# Patient Record
Sex: Female | Born: 1945 | Race: White | Hispanic: No | Marital: Married | Smoking: Former smoker
Health system: Southern US, Community
[De-identification: ages and names within clinical notes are randomized; demographics above are authoritative.]

## PROBLEM LIST (undated history)

## (undated) DIAGNOSIS — I351 Nonrheumatic aortic (valve) insufficiency: Secondary | ICD-10-CM

## (undated) DIAGNOSIS — I071 Rheumatic tricuspid insufficiency: Secondary | ICD-10-CM

## (undated) DIAGNOSIS — J189 Pneumonia, unspecified organism: Secondary | ICD-10-CM

## (undated) DIAGNOSIS — I739 Peripheral vascular disease, unspecified: Secondary | ICD-10-CM

## (undated) DIAGNOSIS — I2722 Pulmonary hypertension due to left heart disease: Secondary | ICD-10-CM

## (undated) DIAGNOSIS — I059 Rheumatic mitral valve disease, unspecified: Secondary | ICD-10-CM

## (undated) DIAGNOSIS — I779 Disorder of arteries and arterioles, unspecified: Secondary | ICD-10-CM

## (undated) DIAGNOSIS — R0989 Other specified symptoms and signs involving the circulatory and respiratory systems: Secondary | ICD-10-CM

## (undated) DIAGNOSIS — I4819 Other persistent atrial fibrillation: Secondary | ICD-10-CM

## (undated) DIAGNOSIS — T8543XA Leakage of breast prosthesis and implant, initial encounter: Secondary | ICD-10-CM

## (undated) DIAGNOSIS — R42 Dizziness and giddiness: Secondary | ICD-10-CM

## (undated) DIAGNOSIS — C50919 Malignant neoplasm of unspecified site of unspecified female breast: Secondary | ICD-10-CM

## (undated) DIAGNOSIS — I672 Cerebral atherosclerosis: Secondary | ICD-10-CM

## (undated) DIAGNOSIS — I00 Rheumatic fever without heart involvement: Secondary | ICD-10-CM

## (undated) DIAGNOSIS — R0600 Dyspnea, unspecified: Secondary | ICD-10-CM

## (undated) DIAGNOSIS — I517 Cardiomegaly: Secondary | ICD-10-CM

## (undated) DIAGNOSIS — I5032 Chronic diastolic (congestive) heart failure: Secondary | ICD-10-CM

## (undated) DIAGNOSIS — I050 Rheumatic mitral stenosis: Secondary | ICD-10-CM

## (undated) HISTORY — PX: TOTAL ABDOMINAL HYSTERECTOMY: SHX209

## (undated) HISTORY — DX: Nonrheumatic aortic (valve) insufficiency: I35.1

## (undated) HISTORY — DX: Cardiomegaly: I51.7

## (undated) HISTORY — DX: Rheumatic tricuspid insufficiency: I07.1

## (undated) HISTORY — PX: MASTECTOMY: SHX3

## (undated) HISTORY — PX: PLACEMENT OF BREAST IMPLANTS: SHX6334

## (undated) HISTORY — DX: Rheumatic mitral valve disease, unspecified: I05.9

## (undated) HISTORY — DX: Cerebral atherosclerosis: I67.2

## (undated) HISTORY — DX: Pulmonary hypertension due to left heart disease: I27.22

## (undated) HISTORY — DX: Rheumatic fever without heart involvement: I00

## (undated) HISTORY — PX: APPENDECTOMY: SHX54

## (undated) HISTORY — DX: Dyspnea, unspecified: R06.00

## (undated) HISTORY — DX: Rheumatic mitral stenosis: I05.0

## (undated) HISTORY — PX: CATARACT EXTRACTION: SUR2

## (undated) HISTORY — DX: Malignant neoplasm of unspecified site of unspecified female breast: C50.919

## (undated) HISTORY — DX: Pneumonia, unspecified organism: J18.9

## (undated) HISTORY — DX: Other specified symptoms and signs involving the circulatory and respiratory systems: R09.89

## (undated) HISTORY — DX: Other persistent atrial fibrillation: I48.19

## (undated) HISTORY — DX: Dizziness and giddiness: R42

## (undated) HISTORY — DX: Leakage of breast prosthesis and implant, initial encounter: T85.43XA

## (undated) HISTORY — DX: Chronic diastolic (congestive) heart failure: I50.32

## (undated) HISTORY — PX: CHOLECYSTECTOMY: SHX55

---

## 2005-02-02 ENCOUNTER — Ambulatory Visit: Payer: Self-pay | Admitting: Cardiology

## 2005-09-25 ENCOUNTER — Ambulatory Visit: Payer: Self-pay | Admitting: Cardiology

## 2006-08-08 HISTORY — PX: CARDIAC CATHETERIZATION: SHX172

## 2006-10-09 ENCOUNTER — Ambulatory Visit: Payer: Self-pay | Admitting: Cardiology

## 2006-10-20 ENCOUNTER — Ambulatory Visit: Payer: Self-pay | Admitting: Cardiology

## 2006-11-01 ENCOUNTER — Ambulatory Visit: Payer: Self-pay | Admitting: Cardiology

## 2006-11-02 ENCOUNTER — Ambulatory Visit: Payer: Self-pay | Admitting: Cardiology

## 2006-11-06 ENCOUNTER — Ambulatory Visit: Payer: Self-pay | Admitting: Cardiovascular Disease

## 2006-11-06 ENCOUNTER — Ambulatory Visit: Payer: Self-pay | Admitting: Internal Medicine

## 2006-11-09 ENCOUNTER — Ambulatory Visit: Payer: Self-pay | Admitting: Cardiology

## 2006-11-15 ENCOUNTER — Ambulatory Visit: Payer: Self-pay | Admitting: Cardiology

## 2006-11-22 ENCOUNTER — Ambulatory Visit: Payer: Self-pay | Admitting: Cardiology

## 2006-12-01 ENCOUNTER — Ambulatory Visit: Payer: Self-pay | Admitting: Cardiology

## 2006-12-12 ENCOUNTER — Ambulatory Visit: Payer: Self-pay | Admitting: Cardiology

## 2006-12-19 ENCOUNTER — Ambulatory Visit: Payer: Self-pay | Admitting: Cardiology

## 2006-12-28 ENCOUNTER — Ambulatory Visit: Payer: Self-pay | Admitting: Cardiology

## 2007-01-02 ENCOUNTER — Ambulatory Visit: Payer: Self-pay | Admitting: Cardiology

## 2007-01-11 ENCOUNTER — Ambulatory Visit: Payer: Self-pay | Admitting: Cardiology

## 2007-01-25 ENCOUNTER — Ambulatory Visit: Payer: Self-pay | Admitting: Physician Assistant

## 2007-02-22 ENCOUNTER — Ambulatory Visit: Payer: Self-pay | Admitting: Cardiology

## 2007-04-17 ENCOUNTER — Ambulatory Visit: Payer: Self-pay | Admitting: Cardiology

## 2007-07-02 ENCOUNTER — Ambulatory Visit: Payer: Self-pay | Admitting: Cardiology

## 2007-07-16 ENCOUNTER — Ambulatory Visit: Payer: Self-pay | Admitting: Cardiology

## 2007-07-16 ENCOUNTER — Inpatient Hospital Stay (HOSPITAL_BASED_OUTPATIENT_CLINIC_OR_DEPARTMENT_OTHER): Admission: RE | Admit: 2007-07-16 | Discharge: 2007-07-16 | Payer: Self-pay | Admitting: Cardiology

## 2007-08-06 ENCOUNTER — Ambulatory Visit: Payer: Self-pay | Admitting: Cardiology

## 2007-12-13 ENCOUNTER — Ambulatory Visit: Payer: Self-pay | Admitting: Cardiology

## 2008-03-03 ENCOUNTER — Ambulatory Visit: Payer: Self-pay | Admitting: Cardiology

## 2008-03-11 ENCOUNTER — Ambulatory Visit: Payer: Self-pay | Admitting: Cardiology

## 2008-03-21 ENCOUNTER — Ambulatory Visit: Payer: Self-pay | Admitting: Cardiology

## 2008-03-27 ENCOUNTER — Ambulatory Visit: Payer: Self-pay | Admitting: Cardiology

## 2008-04-04 ENCOUNTER — Ambulatory Visit: Payer: Self-pay | Admitting: Cardiology

## 2008-04-11 ENCOUNTER — Ambulatory Visit: Payer: Self-pay | Admitting: Cardiology

## 2008-04-26 ENCOUNTER — Ambulatory Visit: Payer: Self-pay | Admitting: Cardiology

## 2008-05-26 ENCOUNTER — Ambulatory Visit: Payer: Self-pay | Admitting: Cardiology

## 2008-06-10 ENCOUNTER — Ambulatory Visit: Payer: Self-pay | Admitting: Cardiology

## 2008-07-02 ENCOUNTER — Encounter: Payer: Self-pay | Admitting: Cardiology

## 2008-07-02 ENCOUNTER — Ambulatory Visit: Payer: Self-pay | Admitting: Cardiology

## 2008-07-02 DIAGNOSIS — I4891 Unspecified atrial fibrillation: Secondary | ICD-10-CM

## 2008-07-02 DIAGNOSIS — L659 Nonscarring hair loss, unspecified: Secondary | ICD-10-CM | POA: Insufficient documentation

## 2008-07-02 DIAGNOSIS — Z9010 Acquired absence of unspecified breast and nipple: Secondary | ICD-10-CM

## 2008-07-08 ENCOUNTER — Ambulatory Visit: Payer: Self-pay | Admitting: Cardiology

## 2008-07-15 ENCOUNTER — Ambulatory Visit: Payer: Self-pay | Admitting: Cardiology

## 2008-07-23 ENCOUNTER — Ambulatory Visit: Payer: Self-pay | Admitting: Internal Medicine

## 2008-08-04 ENCOUNTER — Ambulatory Visit (HOSPITAL_COMMUNITY): Admission: RE | Admit: 2008-08-04 | Discharge: 2008-08-04 | Payer: Self-pay | Admitting: Internal Medicine

## 2008-08-11 ENCOUNTER — Encounter: Payer: Self-pay | Admitting: Cardiology

## 2008-08-11 ENCOUNTER — Ambulatory Visit: Payer: Self-pay | Admitting: Cardiology

## 2008-08-11 DIAGNOSIS — I2789 Other specified pulmonary heart diseases: Secondary | ICD-10-CM

## 2008-08-11 DIAGNOSIS — Z8679 Personal history of other diseases of the circulatory system: Secondary | ICD-10-CM

## 2008-08-19 ENCOUNTER — Encounter: Admission: RE | Admit: 2008-08-19 | Discharge: 2008-08-19 | Payer: Self-pay | Admitting: Internal Medicine

## 2008-09-05 ENCOUNTER — Ambulatory Visit: Payer: Self-pay | Admitting: Cardiology

## 2008-09-26 ENCOUNTER — Ambulatory Visit: Payer: Self-pay | Admitting: Cardiology

## 2008-10-03 ENCOUNTER — Ambulatory Visit: Payer: Self-pay | Admitting: Cardiology

## 2008-10-10 ENCOUNTER — Encounter: Payer: Self-pay | Admitting: Cardiology

## 2008-10-17 ENCOUNTER — Ambulatory Visit: Payer: Self-pay

## 2008-10-23 ENCOUNTER — Encounter: Payer: Self-pay | Admitting: Cardiology

## 2008-10-29 ENCOUNTER — Ambulatory Visit: Payer: Self-pay | Admitting: Cardiology

## 2008-11-04 ENCOUNTER — Ambulatory Visit: Payer: Self-pay | Admitting: Cardiology

## 2008-11-06 ENCOUNTER — Encounter: Payer: Self-pay | Admitting: Cardiology

## 2008-11-06 ENCOUNTER — Ambulatory Visit: Payer: Self-pay | Admitting: Cardiology

## 2008-11-11 ENCOUNTER — Ambulatory Visit: Payer: Self-pay | Admitting: Cardiology

## 2008-11-12 ENCOUNTER — Encounter: Admission: RE | Admit: 2008-11-12 | Discharge: 2008-11-12 | Payer: Self-pay | Admitting: Internal Medicine

## 2008-11-13 ENCOUNTER — Encounter: Payer: Self-pay | Admitting: Cardiology

## 2008-11-18 ENCOUNTER — Ambulatory Visit: Payer: Self-pay | Admitting: Cardiology

## 2008-11-25 ENCOUNTER — Ambulatory Visit: Payer: Self-pay | Admitting: Cardiology

## 2008-12-02 ENCOUNTER — Ambulatory Visit: Payer: Self-pay | Admitting: Cardiology

## 2008-12-19 ENCOUNTER — Ambulatory Visit: Payer: Self-pay | Admitting: Cardiology

## 2008-12-23 ENCOUNTER — Ambulatory Visit: Payer: Self-pay | Admitting: Cardiology

## 2008-12-30 ENCOUNTER — Ambulatory Visit: Payer: Self-pay | Admitting: Cardiology

## 2009-01-06 ENCOUNTER — Ambulatory Visit: Payer: Self-pay

## 2009-01-06 ENCOUNTER — Ambulatory Visit: Payer: Self-pay | Admitting: Cardiology

## 2009-01-07 ENCOUNTER — Encounter: Payer: Self-pay | Admitting: Cardiology

## 2009-01-08 ENCOUNTER — Encounter: Payer: Self-pay | Admitting: Cardiology

## 2009-01-09 ENCOUNTER — Telehealth: Payer: Self-pay | Admitting: Cardiology

## 2009-01-10 ENCOUNTER — Telehealth: Payer: Self-pay | Admitting: Adult Health

## 2009-01-11 ENCOUNTER — Telehealth: Payer: Self-pay | Admitting: Physician Assistant

## 2009-01-12 ENCOUNTER — Telehealth: Payer: Self-pay | Admitting: Cardiology

## 2009-01-13 ENCOUNTER — Ambulatory Visit: Payer: Self-pay | Admitting: Cardiology

## 2009-01-16 ENCOUNTER — Telehealth: Payer: Self-pay | Admitting: Cardiology

## 2009-01-20 ENCOUNTER — Ambulatory Visit: Payer: Self-pay | Admitting: Cardiology

## 2009-01-22 ENCOUNTER — Ambulatory Visit: Payer: Self-pay | Admitting: Cardiology

## 2009-01-27 ENCOUNTER — Ambulatory Visit: Payer: Self-pay | Admitting: Cardiology

## 2009-02-03 ENCOUNTER — Ambulatory Visit: Payer: Self-pay | Admitting: Cardiology

## 2009-02-11 ENCOUNTER — Ambulatory Visit: Payer: Self-pay | Admitting: Cardiology

## 2009-02-11 ENCOUNTER — Encounter: Payer: Self-pay | Admitting: Cardiology

## 2009-03-23 ENCOUNTER — Encounter: Payer: Self-pay | Admitting: *Deleted

## 2009-04-01 ENCOUNTER — Encounter: Payer: Self-pay | Admitting: Cardiology

## 2009-04-28 ENCOUNTER — Encounter: Payer: Self-pay | Admitting: Cardiology

## 2009-05-11 ENCOUNTER — Encounter: Admission: RE | Admit: 2009-05-11 | Discharge: 2009-05-11 | Payer: Self-pay | Admitting: Internal Medicine

## 2009-06-01 ENCOUNTER — Encounter (INDEPENDENT_AMBULATORY_CARE_PROVIDER_SITE_OTHER): Payer: Self-pay | Admitting: *Deleted

## 2009-06-02 ENCOUNTER — Ambulatory Visit: Payer: Self-pay | Admitting: Cardiology

## 2009-06-02 DIAGNOSIS — C50919 Malignant neoplasm of unspecified site of unspecified female breast: Secondary | ICD-10-CM | POA: Insufficient documentation

## 2009-06-03 ENCOUNTER — Encounter: Payer: Self-pay | Admitting: Cardiology

## 2009-07-08 ENCOUNTER — Encounter: Payer: Self-pay | Admitting: Cardiology

## 2009-07-13 ENCOUNTER — Encounter: Payer: Self-pay | Admitting: Cardiology

## 2009-07-21 ENCOUNTER — Encounter (INDEPENDENT_AMBULATORY_CARE_PROVIDER_SITE_OTHER): Payer: Self-pay | Admitting: *Deleted

## 2009-09-02 ENCOUNTER — Encounter: Admission: RE | Admit: 2009-09-02 | Discharge: 2009-09-02 | Payer: Self-pay | Admitting: Internal Medicine

## 2009-11-03 ENCOUNTER — Telehealth (INDEPENDENT_AMBULATORY_CARE_PROVIDER_SITE_OTHER): Payer: Self-pay | Admitting: *Deleted

## 2009-11-04 ENCOUNTER — Telehealth: Payer: Self-pay | Admitting: Adult Health

## 2009-11-25 ENCOUNTER — Ambulatory Visit: Payer: Self-pay | Admitting: Cardiology

## 2009-11-25 DIAGNOSIS — R05 Cough: Secondary | ICD-10-CM

## 2009-11-26 ENCOUNTER — Encounter: Payer: Self-pay | Admitting: Cardiology

## 2010-01-27 ENCOUNTER — Ambulatory Visit: Payer: Self-pay | Admitting: Cardiology

## 2010-04-08 ENCOUNTER — Telehealth (INDEPENDENT_AMBULATORY_CARE_PROVIDER_SITE_OTHER): Payer: Self-pay | Admitting: *Deleted

## 2010-06-03 ENCOUNTER — Ambulatory Visit: Payer: Self-pay | Admitting: Cardiology

## 2010-06-17 ENCOUNTER — Telehealth (INDEPENDENT_AMBULATORY_CARE_PROVIDER_SITE_OTHER): Payer: Self-pay | Admitting: *Deleted

## 2010-06-23 ENCOUNTER — Encounter: Payer: Self-pay | Admitting: Cardiology

## 2010-07-05 ENCOUNTER — Encounter: Payer: Self-pay | Admitting: Cardiology

## 2010-07-07 ENCOUNTER — Telehealth (INDEPENDENT_AMBULATORY_CARE_PROVIDER_SITE_OTHER): Payer: Self-pay | Admitting: *Deleted

## 2010-09-07 NOTE — Letter (Signed)
Summary: Letter/ DISABILITY PARKING FORM  Letter/ DISABILITY PARKING FORM   Imported By: Bartholomew Boards 11/25/2009 15:41:31  _____________________________________________________________________  External Attachment:    Type:   Image     Comment:   External Document

## 2010-09-07 NOTE — Assessment & Plan Note (Signed)
Summary: 6 MO F/U PER REMINDER-JM   Visit Type:  Follow-up Primary Quintrell Baze:  Sharma Covert   History of Present Illness: the patient is a 65 year old female with a history of atrial fibrillation, status post cardioversion now maintaining normal sinus rhythm on propafenone therapy.the patient is not on Coumadin she has very low risk for stroke embolic events. She currently has a bronchitis. She has no history of coronary artery disease.  She does report frequent episodes that are waking her up at the o'clock or 4:00 in the morning with a sensation of an irregular heartbeat. However she does not feel that her heart beat is rapid. She denies any chest pain or shortness of breath associated with this. She also has no presyncope or syncope.  Preventive Screening-Counseling & Management  Alcohol-Tobacco     Smoking Status: quit     Year Quit: 1985  Current Medications (verified): 1)  Diltiazem Hcl Er Beads 120 Mg Xr24h-Cap (Diltiazem Hcl Er Beads) .... Take One Capsule By Mouth Once Daily 2)  Nitroglycerin 0.4 Mg Subl (Nitroglycerin) .... Place 1 Tablet Under Tongue 3)  Propafenone Hcl 150 Mg Tabs (Propafenone Hcl) .... Take 1 Tablet By Mouth Every Eight Hours 4)  Metoprolol Tartrate 25 Mg Tabs (Metoprolol Tartrate) .... Take 1/2 Tablet By Mouth Twice A Day 5)  Furosemide 20 Mg Tabs (Furosemide) .... Take 1 Tablet By Mouth As Needed 6)  Aspirin Ec 325 Mg Tbec (Aspirin) .... Take One Tablet By Mouth Daily 7)  Amoxicillin 500 Mg Caps (Amoxicillin) .... Take 3 Tablet By Mouth Two Times A Day Times 10 Days  Allergies (verified): 1)  ! Talwin 2)  ! Darvon 3)  ! Novocain 4)  ! Augmentin 5)  Levaquin  Comments:  Nurse/Medical Assistant: The patient's medication bottles and allergies were reviewed with the patient and were updated in the Medication and Allergy Lists.  Past History:  Past Medical History: Last updated: 06/02/2009  1. Atrial fibrillation, status post cardioversion,  maintain normal       sinus rhythm.   2. Propafenone therapy.   3. Coumadin therapy.   4. Atypical chest pain with normal coronary angiogram in December       2008.   5. Mild left ventricular function.   6. Pulmonary hypertension, nonobstructive cerebrovascular disease.   7. Breast cancer.       a.     Status post remote right breast mastectomy.       b.     Status post bilateral breast implants.       c.     Status post recent PET scan negative for metastatic breast        cancer, small medial right breast mass suggestive of benign        etiology versus silicone leak.       d.     Right axis liver nodes labeled as nonspecific.   8. Silicone leak.   9. Chronic exertional dyspnea related to intermittent atrial       fibrillation versus lung disease, decreased DLCO. normal coronary arteries Persistent atrial fibrillation  moderate pulmonary hypertension right carotid bruit history of breast cancerstatus post modified radical mastectomy, status post bilateral breast implants and right breast implant rupture. remote tobacco use recent pneumonia rheumatic fever as a child diet-controlled diabetes mellitus  Past Surgical History: Last updated: 08/11/2008 status post appendectomy Status post cholecystectomy Status post total abdominal hysterectomy  Family History: Last updated: 09/02/2008 The patient lives in [place], [name].  She has  a 15-pack-year history of tobacco but quit 25 years ago.  She denies alcohol or drug use  Social History: Last updated: September 23, 2008 father died at age 60 with prostate cancer.  mother died at age 45 with metastatic cervical cancer.    Risk Factors: Smoking Status: quit (06/03/2010)  Review of Systems       The patient complains of fatigue and prolonged cough.  The patient denies malaise, fever, weight gain/loss, vision loss, decreased hearing, hoarseness, chest pain, palpitations, shortness of breath, wheezing, sleep apnea, coughing up  blood, abdominal pain, blood in stool, nausea, vomiting, diarrhea, heartburn, incontinence, blood in urine, muscle weakness, joint pain, leg swelling, rash, skin lesions, headache, fainting, dizziness, depression, anxiety, enlarged lymph nodes, easy bruising or bleeding, and environmental allergies.    Vital Signs:  Patient profile:   65 year old female Height:      68 inches Weight:      222 pounds BMI:     33.88 Pulse rate:   57 / minute BP sitting:   100 / 68  (left arm) Cuff size:   large  Vitals Entered By: Georgina Peer (June 03, 2010 10:44 AM)  Nutrition Counseling: Patient's BMI is greater than 25 and therefore counseled on weight management options.  Physical Exam  Additional Exam:  General: Well-developed, well-nourished in no distress head: Normocephalic and atraumatic eyes PERRLA/EOMI intact, conjunctiva and lids normal nose: No deformity or lesions mouth normal dentition, normal posterior pharynx neck: Supple, no JVD.  No masses, thyromegaly or abnormal cervical nodes lungs: rhonchi bilaterally with expiratory wheezes.  Normal percussion heart: regular rate and rhythm with normal S1 and S2, no S3 or S4.  PMI is normal.  No pathological murmurs abdomen: Normal bowel sounds, abdomen is soft and nontender without masses, organomegaly or hernias noted.  No hepatosplenomegaly musculoskeletal: Back normal, normal gait muscle strength and tone normal pulsus: Pulse is normal in all 4 extremities Extremities: No peripheral pitting edema neurologic: Alert and oriented x 3 skin: Intact without lesions or rashes cervical nodes: No significant adenopathy psychologic: Normal affect    EKG  Procedure date:  06/03/2010  Findings:      sinus bradycardia otherwise normal EKG heart rate 54 beats per minute  Impression & Recommendations:  Problem # 1:  COUGH (ICD-786.2) patient has bronchitis. She is on amoxicillin. She clearly has an abnormal lung exam. Her updated  medication list for this problem includes:    Diltiazem Hcl Er Beads 120 Mg Xr24h-cap (Diltiazem hcl er beads) .Marland Kitchen... Take one capsule by mouth once daily    Nitroglycerin 0.4 Mg Subl (Nitroglycerin) .Marland Kitchen... Place 1 tablet under tongue    Propafenone Hcl 150 Mg Tabs (Propafenone hcl) .Marland Kitchen... Take 1 tablet by mouth every eight hours    Metoprolol Tartrate 25 Mg Tabs (Metoprolol tartrate) .Marland Kitchen... Take 1/2 tablet by mouth twice a day    Aspirin Ec 325 Mg Tbec (Aspirin) .Marland Kitchen... Take one tablet by mouth daily  Problem # 2:  ATRIAL FIBRILLATION (ICD-427.31) the patient remains in normal sinus rhythm on propafenone. She does have occasional irregular heartbeats apparently at 4:00 in the morning. I asked her to take her Cardizem in the evening. If this improves her symptoms but still occurs on occasion we will further increase her dose to 180 mg p.o. q.h.s. Her updated medication list for this problem includes:    Propafenone Hcl 150 Mg Tabs (Propafenone hcl) .Marland Kitchen... Take 1 tablet by mouth every eight hours    Metoprolol Tartrate 25 Mg  Tabs (Metoprolol tartrate) .Marland Kitchen... Take 1/2 tablet by mouth twice a day    Aspirin Ec 325 Mg Tbec (Aspirin) .Marland Kitchen... Take one tablet by mouth daily  Problem # 3:  MASTECTOMY, HX OF (ICD-V45.71) Assessment: Comment Only patient is still considering removing implants because of silicone leak. I've told her that I have  given her cardiac clearance for this problem.  Patient Instructions: 1)  Your physician recommends that you continue on your current medications as directed. Please refer to the Current Medication list given to you today. 2)  Follow up in 6 months Prescriptions: FUROSEMIDE 20 MG TABS (FUROSEMIDE) Take 1 tablet by mouth as needed  #30 x 6   Entered by:   Lovina Reach, LPN   Authorized by:   Terald Sleeper, MD, [facility]   Signed by:   Lovina Reach, LPN on 624THL   Method used:   Electronically to        [id number] (retail)       [place], [place]  [id number]       Ph: [phone]       Fax: [phone]   RxID:   [id number] NITROGLYCERIN 0.4 MG SUBL (NITROGLYCERIN) Place 1 tablet under tongue  #25 x 3   Entered by:   Lovina Reach, LPN   Authorized by:   Terald Sleeper, MD, [facility]   Signed by:   Lovina Reach, LPN on 624THL   Method used:   Electronically to        [id number] (retail)       [address], [place], [place]  [id number]       Ph: [phone]       Fax: [phone]   RxID:   [id number]

## 2010-09-07 NOTE — Progress Notes (Signed)
Summary: PHONE:MEDICATION ISSUES  Phone Note Call from Patient Call back at Home Phone [phone]   Caller: Patient Details for Reason: MEDICATIONS Summary of Call:   Propafenone Hcl 150 Mg Tabs (Propafenone Hcl) .... Take 1 Tablet By Mouth Every Eight Hours   Metoprolol Tartrate 25 Mg Tabs (Metoprolol Tartrate) .... Take 1/2 Tablet By Mouth Twice A Day Mrs. Smoley called in regards to these medications. States that she is uncertain if she took the correct amount today and does not know what to do now.  please call her cell # [phone] Initial call taken by: Delfino Lovett,  July 07, 2010 3:52 PM  Follow-up for Phone Call        Return call to patient, stated she had problem yesterday not today.  Stated she was okay today. Call returned today as message was left at 3:52 yesterday evening.  Lovina Reach, LPN  December  1, 624THL 2:57 PM

## 2010-09-07 NOTE — Progress Notes (Signed)
Summary: clearance  Phone Note Other Incoming   Caller: VOICEMAIL MESSAGE Summary of Call: Reqeusting we send clearance note to [facility] for surgery.  Discussed with MD at last OV.  Return call to patient.  Informed pt that clearance is documented in last OV note.  Note needs to go to:  Dr. Leeann Must at [facility]. Fax:  [phone].  Attention - Bethena Roys.  Needs to be there by 11/18.  Advised pt, will try to send today.  Patient verbalized understanding.  Initial call taken by: Lovina Reach, LPN,  November 10, 624THL 3:19 PM  Follow-up for Phone Call        Notes faxed today.  Follow-up by: Lovina Reach, LPN,  November 16, 624THL 11:41 AM

## 2010-09-07 NOTE — Progress Notes (Signed)
  Phone Note Call from Patient   Action Taken: Phone Call Completed, Provider Notified Summary of Call: Mrs. Tillmon called stating that her HR remains elevated 80's-90's.  She has had a "terrible cough."  After coughing she noticed that her HR had not come down to normal.  She listed her medications to included sotolol 150mg  three times a day, Metoprolol 12.5 mg two times a day, cardiazem.  She asked if she could take her metoprolol earlier than scheduled at 8pm (it was 645p when she called.)  I told her to go ahead and take the metoprolol a little early.  If her HR did not come down, she could take and additional 12.5mg  if necessary.  She is to take the sotolol at 12MN.  I have advised her to take that as directed.  She is to call back should she continue to have problems with elevated HR.  She verbalized understanding. Initial call taken by: Jory Sims NP     Appended Document:  Need to clarify medications - should not be on sotalol but propafenone.   Appended Document:  Left message to return call.   Appended Document:  Discussed above with pt. She is not on sotalol.  Will f/u in office on 4/20.

## 2010-09-07 NOTE — Progress Notes (Signed)
Summary: Increased Heartrate  Phone Note Call from Patient Call back at [facility] Phone [phone]   Summary of Call: Pt called stating she had episode at 3am this morning of increased HR and BP. HR was 118, BP was 148/74. She states she had some chest pressure and nausea. She states she has chest pressure sometimes b/c of implants so she has a hard time determining if chest pressure is from implants or something else. She states the last time her HR was increased like this Dr. Lutricia Feil adjusted her medications.  Pt states she feels back to normal now.  Initial call taken by: Gurney Maxin, RN, BSN,  April 08, 2010 8:50 AM  Follow-up for Phone Call        Would just monitor for now. If recurrent epsode will have to see in office.  Follow-up by: Terald Sleeper, MD, [facility],  April 08, 2010 1:48 PM  Additional Follow-up for Phone Call Additional follow up Details #1::        Pt notified and verbalized understanding.  Additional Follow-up by: Gurney Maxin, RN, BSN,  April 08, 2010 4:45 PM

## 2010-09-07 NOTE — Progress Notes (Signed)
Summary: refill request propafenone  Phone Note Call from Patient Call back at Home Phone [phone]   Caller: patient walk in Reason for Call: Refill Medication Details for Reason: medication refill Summary of Call: pythemol refillr request to [facility]  3 months supply she thinks it is the 150 mg that is needed you can call cell [phone] Initial call taken by: Cher Nakai,  November 03, 2009 1:16 PM    Prescriptions: PROPAFENONE HCL 150 MG TABS (PROPAFENONE HCL) Take 1 tablet by mouth every eight hours  #270 x 0   Entered by:   Georgina Peer   Authorized by:   Terald Sleeper, MD, [facility]   Signed by:   Georgina Peer on 11/03/2009   Method used:   Electronically to        [facility] (mail-order)             ,          Ph: [phone]       Fax: [phone]   RxIDKB:[id number]

## 2010-09-07 NOTE — Assessment & Plan Note (Signed)
Summary: PATIENT WALK IN  Nurse Visit   Vital Signs:  Patient profile:   65 year old female Height:      68 inches Weight:      223 pounds O2 Sat:      98 % Pulse rate:   58 / minute BP sitting:   121 / 76  (left arm) Cuff size:   large  Vitals Entered By: Georgina Peer (January 27, 2010 8:56 AM)  Past History:  Past Medical History: Last updated: 06/02/2009  1. Atrial fibrillation, status post cardioversion, maintain normal       sinus rhythm.   2. Propafenone therapy.   3. Coumadin therapy.   4. Atypical chest pain with normal coronary angiogram in December       2008.   5. Mild left ventricular function.   6. Pulmonary hypertension, nonobstructive cerebrovascular disease.   7. Breast cancer.       a.     Status post remote right breast mastectomy.       b.     Status post bilateral breast implants.       c.     Status post recent PET scan negative for metastatic breast        cancer, small medial right breast mass suggestive of benign        etiology versus silicone leak.       d.     Right axis liver nodes labeled as nonspecific.   8. Silicone leak.   9. Chronic exertional dyspnea related to intermittent atrial       fibrillation versus lung disease, decreased DLCO. normal coronary arteries Persistent atrial fibrillation  moderate pulmonary hypertension right carotid bruit history of breast cancerstatus post modified radical mastectomy, status post bilateral breast implants and right breast implant rupture. remote tobacco use recent pneumonia rheumatic fever as a child diet-controlled diabetes mellitus  CC: HR 88 and felt SOB this morning,took medication 1hour earlier   Preventive Screening-Counseling & Management  Alcohol-Tobacco     Smoking Status: quit     Year Quit: 1985  Current Medications (verified): 1)  Diltiazem Hcl Er Beads 120 Mg Xr24h-Cap (Diltiazem Hcl Er Beads) .... Take One Capsule By Mouth Once Daily 2)  Nitroglycerin 0.4 Mg Subl  (Nitroglycerin) .... Place 1 Tablet Under Tongue 3)  Propafenone Hcl 150 Mg Tabs (Propafenone Hcl) .... Take 1 Tablet By Mouth Every Eight Hours 4)  Metoprolol Tartrate 25 Mg Tabs (Metoprolol Tartrate) .... Take 1/2 Tablet By Mouth Twice A Day 5)  Alprazolam 0.5 Mg Tabs (Alprazolam) .... As Needed 6)  Furosemide 20 Mg Tabs (Furosemide) .... Take 1 Tablet By Mouth As Needed 7)  Aspirin Ec 325 Mg Tbec (Aspirin) .... Take One Tablet By Mouth Daily 8)  Zyrtec Allergy 10 Mg Caps (Cetirizine Hcl) .... Take 1 Tab By Mouth At Bedtime As Needed  Allergies (verified): 1)  ! Talwin 2)  ! Darvon 3)  ! Novocain 4)  Levaquin  Comments:  Nurse/Medical Assistant: The patient's medication list and allergies were reviewed with the patient and were updated in the Medication and Allergy Lists.  Orders Added: 1)  EKG w/ Interpretation [93000] 2)  Est. Patient Level I [id number]  Visit Type:  nurse visit for EKG  CC:  HR 88 and felt SOB this morning and took medication 1hour earlier.  EKG demonstrates normal sinus rhythm. No changes in therapy indicated at the present time. Terald Sleeper, MD, [facility]  January 27, 2010 3:19 PM Patient informed  of the above.      EKG  Procedure date:  01/27/2010  Findings:      Sinus bradycardia. Heart rate 59 beats per minute. QTC 415 ms

## 2010-09-07 NOTE — Assessment & Plan Note (Signed)
Summary: 6 MONTH FU-RECV LETTER VS   Visit Type:  Follow-up Primary Provider:  Sharma Covert  CC:  follow-up visit.  History of Present Illness: the patient is a 65 year old female with a history of paroxysmal atrial fibrillation, status post cardioversion on propafenone. The patient is maintaining normal sinus rhythm. She denies any recurrent palpitations. She reports no shortness of breath orthopnea PND. She states is doing well from a cardiovascular perspective. The patient had an essentially negative catheterization in 2008.  Preventive Screening-Counseling & Management  Alcohol-Tobacco     Smoking Status: quit     Year Quit: 1985  Current Medications (verified): 1)  Diltiazem Hcl Er Beads 120 Mg Xr24h-Cap (Diltiazem Hcl Er Beads) .... Take One Capsule By Mouth Once Daily 2)  Nitroglycerin 0.4 Mg Subl (Nitroglycerin) .... Place 1 Tablet Under Tongue 3)  Propafenone Hcl 150 Mg Tabs (Propafenone Hcl) .... Take 1 Tablet By Mouth Every Eight Hours 4)  Metoprolol Tartrate 25 Mg Tabs (Metoprolol Tartrate) .... Take 1/2 Tablet By Mouth Twice A Day 5)  Alprazolam 0.5 Mg Tabs (Alprazolam) .... As Needed 6)  Furosemide 20 Mg Tabs (Furosemide) .... Take 1 Tablet By Mouth As Needed 7)  Aspirin Ec 325 Mg Tbec (Aspirin) .... Take One Tablet By Mouth Daily 8)  Zyrtec Allergy 10 Mg Caps (Cetirizine Hcl) .... Take 1 Tab By Mouth At Bedtime As Needed  Allergies (verified): 1)  ! Talwin 2)  ! Darvon 3)  ! Novocain 4)  Levaquin  Comments:  Nurse/Medical Assistant: The patient's medications and allergies were reviewed with the patient and were updated in the Medication and Allergy Lists. List reveiwed.  Past History:  Past Medical History: Last updated: 06/02/2009  1. Atrial fibrillation, status post cardioversion, maintain normal       sinus rhythm.   2. Propafenone therapy.   3. Coumadin therapy.   4. Atypical chest pain with normal coronary angiogram in December       2008.   5.  Mild left ventricular function.   6. Pulmonary hypertension, nonobstructive cerebrovascular disease.   7. Breast cancer.       a.     Status post remote right breast mastectomy.       b.     Status post bilateral breast implants.       c.     Status post recent PET scan negative for metastatic breast        cancer, small medial right breast mass suggestive of benign        etiology versus silicone leak.       d.     Right axis liver nodes labeled as nonspecific.   8. Silicone leak.   9. Chronic exertional dyspnea related to intermittent atrial       fibrillation versus lung disease, decreased DLCO. normal coronary arteries Persistent atrial fibrillation  moderate pulmonary hypertension right carotid bruit history of breast cancerstatus post modified radical mastectomy, status post bilateral breast implants and right breast implant rupture. remote tobacco use recent pneumonia rheumatic fever as a child diet-controlled diabetes mellitus  Past Surgical History: Last updated: 08/11/2008 status post appendectomy Status post cholecystectomy Status post total abdominal hysterectomy  Family History: Last updated: 08-Sep-2008 The patient lives in [place], [name].  She has a 15-pack-year history of tobacco but quit 25 years ago.  She denies alcohol or drug use  Social History: Last updated: 2008/09/08 father died at age 63 with prostate cancer.  mother died at age 45 with metastatic cervical  cancer.    Risk Factors: Smoking Status: quit (11/25/2009)  Social History: Smoking Status:  quit  Review of Systems  The patient denies fatigue, malaise, fever, weight gain/loss, vision loss, decreased hearing, hoarseness, chest pain, palpitations, shortness of breath, prolonged cough, wheezing, sleep apnea, coughing up blood, abdominal pain, blood in stool, nausea, vomiting, diarrhea, heartburn, incontinence, blood in urine, muscle weakness, joint pain, leg swelling, rash, skin lesions,  headache, fainting, dizziness, depression, anxiety, enlarged lymph nodes, easy bruising or bleeding, and environmental allergies.    Vital Signs:  Patient profile:   65 year old female Height:      68 inches Weight:      220 pounds Pulse rate:   56 / minute BP sitting:   116 / 77  (left arm) Cuff size:   large  Vitals Entered By: Georgina Peer (November 25, 2009 10:47 AM) CC: follow-up visit   Physical Exam  Additional Exam:  General: Well-developed, well-nourished in no distress head: Normocephalic and atraumatic eyes PERRLA/EOMI intact, conjunctiva and lids normal nose: No deformity or lesions mouth normal dentition, normal posterior pharynx neck: Supple, no JVD.  No masses, thyromegaly or abnormal cervical nodes lungs: Normal breath sounds bilaterally without wheezing.  Normal percussion heart: regular rate and rhythm with normal S1 and S2, no S3 or S4.  PMI is normal.  No pathological murmurs abdomen: Normal bowel sounds, abdomen is soft and nontender without masses, organomegaly or hernias noted.  No hepatosplenomegaly musculoskeletal: Back normal, normal gait muscle strength and tone normal pulsus: Pulse is normal in all 4 extremities Extremities: No peripheral pitting edema neurologic: Alert and oriented x 3 skin: Intact without lesions or rashes cervical nodes: No significant adenopathy psychologic: Normal affect    EKG  Procedure date:  11/25/2009  Findings:      sinus bradycardia otherwise normal tracing  Impression & Recommendations:  Problem # 1:  ATRIAL FIBRILLATION (ICD-427.31) the patient remains in normal sinus rhythm. She denies any recurrent palpitations. We will continue propafenone Her updated medication list for this problem includes:    Propafenone Hcl 150 Mg Tabs (Propafenone hcl) .Marland Kitchen... Take 1 tablet by mouth every eight hours    Metoprolol Tartrate 25 Mg Tabs (Metoprolol tartrate) .Marland Kitchen... Take 1/2 tablet by mouth twice a day    Aspirin Ec 325 Mg  Tbec (Aspirin) .Marland Kitchen... Take one tablet by mouth daily  Orders: EKG w/ Interpretation (93000)  Problem # 2:  COUMADIN THERAPY (Q000111Q) no complication Coumadin therapy  Problem # 3:  BREAST CANCER (ICD-174.9) Assessment: Comment Only  Problem # 4:  COUGH (ICD-786.2) the patient reports cough associated with some drainage. She could have an upper airway cough syndrome. I recommended Zyrtec 10 mg p.o. q. daily Her updated medication list for this problem includes:    Diltiazem Hcl Er Beads 120 Mg Xr24h-cap (Diltiazem hcl er beads) .Marland Kitchen... Take one capsule by mouth once daily    Nitroglycerin 0.4 Mg Subl (Nitroglycerin) .Marland Kitchen... Place 1 tablet under tongue    Propafenone Hcl 150 Mg Tabs (Propafenone hcl) .Marland Kitchen... Take 1 tablet by mouth every eight hours    Metoprolol Tartrate 25 Mg Tabs (Metoprolol tartrate) .Marland Kitchen... Take 1/2 tablet by mouth twice a day    Aspirin Ec 325 Mg Tbec (Aspirin) .Marland Kitchen... Take one tablet by mouth daily  Patient Instructions: 1)  Zyrtec 10mg  at bedtime - can buy OTC   2)  Follow up in  6 months  Prescriptions: METOPROLOL TARTRATE 25 MG TABS (METOPROLOL TARTRATE) Take 1/2 tablet by  mouth twice a day  #90 x 3   Entered by:   Lovina Reach, LPN   Authorized by:   Terald Sleeper, MD, [facility]   Signed by:   Lovina Reach, LPN on [id number]   Method used:   Electronically to        [facility] (mail-order)             ,          Ph: [phone]       Fax: [phone]   [id number] PROPAFENONE HCL 150 MG TABS (PROPAFENONE HCL) Take 1 tablet by mouth every eight hours  #270 x 3   Entered by:   Lovina Reach, LPN   Authorized by:   Terald Sleeper, MD, [facility]   Signed by:   Lovina Reach, LPN on [id number]   Method used:   Electronically to        [facility] (mail-order)             ,          Ph: [phone]       Fax: [phone]   [id number]:[id number] DILTIAZEM HCL ER BEADS 120 MG XR24H-CAP (DILTIAZEM HCL ER BEADS) Take one capsule by mouth once daily  #90 x 3    Entered by:   Lovina Reach, LPN   Authorized by:   Terald Sleeper, MD, [facility]   Signed by:   Lovina Reach, LPN on [id number]   Method used:   Electronically to        [facility] (mail-order)             ,          Ph: [phone]       Fax: [phone]   [id number]

## 2010-09-07 NOTE — Letter (Signed)
Summary: External Correspondence/ FAXED [facility]  External Correspondence/ FAXED [facility]   Imported By: Bartholomew Boards 06/29/2010 12:02:52  _____________________________________________________________________  External Attachment:    Type:   Image     Comment:   External Document

## 2010-09-07 NOTE — Letter (Signed)
Summary: Letter/ FAXED [facility]  Letter/ FAXED [facility]   Imported By: Bartholomew Boards 11/26/2009 10:19:53  _____________________________________________________________________  External Attachment:    Type:   Image     Comment:   External Document

## 2010-09-09 NOTE — Letter (Signed)
Summary: External Correspondence/ CONSULT NOTE DR[facility]   External Correspondence/ CONSULT NOTE DR[place]   Imported By: Bartholomew Boards 07/20/2010 08:24:47  _____________________________________________________________________  External Attachment:    Type:   Image     Comment:   External Document

## 2010-09-26 ENCOUNTER — Telehealth (INDEPENDENT_AMBULATORY_CARE_PROVIDER_SITE_OTHER): Payer: Self-pay | Admitting: *Deleted

## 2010-10-05 NOTE — Progress Notes (Signed)
Summary: Cardiology Phone Note - Fast HR  Phone Note Call from Patient   Caller: Patient Summary of Call: Pt called today to state that her heart rate has been running faster than usual, 90s-110's, and she felt it was pounding heavier than usual. No CP/SOB. She usually feels when she is in afib, and states this was not irregular, just quicker. She took her morning meds around 8am, has metoprolol 1/2 tablet due at 8pm. BP 113/72. She states she tends to run in the 60's. Instructed pt to take her scheduled metoprolol now (1/2 tablet). If still symptomatic from heart rate being up in about 4 hours, recheck pulse and may take only 1 additional half tablet of metoprolol. If having other symptoms concerning to her or low BP in the setting of fast HR, advised to proceed to ER. Also instruced pt to schedule f/u appt with Dr. Dannielle Burn soon as she states she does not have one. She expressed understanding. Initial call taken by: Melina Copa PA-C

## 2010-10-12 ENCOUNTER — Telehealth (INDEPENDENT_AMBULATORY_CARE_PROVIDER_SITE_OTHER): Payer: Self-pay | Admitting: *Deleted

## 2010-10-19 NOTE — Progress Notes (Signed)
Summary: elevated heart rate  Phone Note Call from Patient   Summary of Call: Patient called with episode of fast heart rate.  90's to 120's Just feels fast, does not feel out of rhythm.  No c/o dizziness, chest pain, SOB.  Advised patient per Dr. Dannielle Burn to increase Metoprol to one tab two times a day & f/u in office.  Advised pt to go to ED for evaluation if symptoms worsen.  Patient verbalized understanding.   OV scheduled for 4/13 at 9:45.   Initial call taken by: Lovina Reach, LPN,  March  6, [id number] 9:13 AM    New/Updated Medications: METOPROLOL TARTRATE 25 MG TABS (METOPROLOL TARTRATE) Take 1 tablet by mouth two times a day

## 2010-11-19 ENCOUNTER — Ambulatory Visit: Payer: Self-pay | Admitting: Cardiology

## 2010-12-02 ENCOUNTER — Other Ambulatory Visit: Payer: Self-pay | Admitting: Cardiology

## 2010-12-21 NOTE — Assessment & Plan Note (Signed)
[facility] OFFICE NOTE   Taylor Weber, Taylor Weber                      MRN:          [id number]  DATE:09/26/2008                            DOB:          [birth year]    PRIMARY CARDIOLOGIST:  Ernestine Mcmurray, MD, [facility]   REASON FOR VISIT:  Scheduled followup.  Please refer to his recent  office note of August 11, 2008, for complete details.   Taylor Weber returns to clinic today reporting that she did not start the  dronedarone, as recommended, secondary to excessive cost.  She did,  however, try flecainide, but once again was not able to tolerate this,  citing exacerbation of her chronic dyspnea.   She is being regularly followed by Dr. Jacquiline Doe, for ongoing monitoring  of a right breast mass.  She was referred to Dr. Owens Shark, a radiation  oncologist in [place], for further evaluation.  She reports having  had an ultrasound of the right axilla, and is due to return in 3 months.   From our perspective, she continues to experience exertional dyspnea,  which appears to have progressed somewhat since her last visit.  However, she denies any symptoms suggestive of orthopnea, PND, or lower  extremity edema.  She has occasional chest pressure, with or without  exertion.   Taylor Weber also denies any frank fluttering, but notes that she is  always out of rhythm.   A 12-lead EKG today indicates atrial fibrillation at 79 bpm with normal  axis and nonspecific ST findings.   CURRENT MEDICATIONS:  1. Metoprolol tartrate 25 b.i.d.  2. Diltiazem CD 180 daily.  3. Coumadin 5 mg, as directed.   PHYSICAL EXAMINATION:  VITAL SIGNS:  Blood pressure 113/71, pulse 75 and  irregular.  Weight 233 (up 5 pounds).  GENERAL:  A 65 year old female, obese, sitting upright, no distress.  HEENT:  Normocephalic, atraumatic.  NECK:  Palpable carotid pulse without bruits; no JVD.  LUNGS:  Clear to auscultation in all fields.  HEART:   Irregularly irregular.  No significant murmurs.  No rubs.  ABDOMEN:  Benign.  EXTREMITIES:  No significant edema.  NEUROLOGIC:  Flat affect, but no focal deficit.   IMPRESSION:  1. Persistent atrial fibrillation.      a.     Intolerant to FLECAINIDE, secondary to worsening dyspnea.  2. Chronic Coumadin.      a.     Followed in our [facility]  3. Atypical chest pain.      a.     Normal coronary angiogram, December 2008.  4. Normal left ventricular function.  5. Moderate pulmonary hypertension.  6. Nonobstructive cerebrovascular disease.  7. History of breast cancer.      a.     Status post remote right breast mastectomy.      b.     Status post bilateral breast implants.      c.     Status post recent PET scan, negative for metastatic breast       cancer.  Small, medial right breast mass, suggestive of benign  etiology.  Right axillary/supraclavicular node, nonspecific.  8. Chronic exertional dyspnea.   PLAN:  1. Following review with Dr. Dannielle Burn, recommendation is to challenge      her with the antiarrhythmic propafenone at 150 mg q.8 hours.  2. Schedule 48-hour Holter monitoring for assessment of rate      variability, to ensure adequate rate control.  3. Schedule pulmonary function tests with DLCO.  4. Schedule early clinic followup with myself and Dr. Dannielle Burn in 2      months, for review of study results and further recommendations.      Taylor Serpe, PA-C  Electronically Signed      Ernestine Mcmurray, MD,FACC  Electronically Signed   GS/MedQ  DD: 09/26/2008  DT: 09/27/2008  Job #: [id number]   cc:   Marko Stai. Darovsky, M.D.  Rory Percy

## 2010-12-21 NOTE — Assessment & Plan Note (Signed)
[facility] OFFICE NOTE   AHNIA, BRUNEAU                      MRN:          [id number]  DATE:12/28/2006                            DOB:          10-Mar-1946    HISTORY OF PRESENT ILLNESS:  The patient is a 65 year old female with a  history of paroxysmal atrial fibrillation.  The patient states that she  is feeling quite well.  She has had occasional palpitations, but they  have been rare.  Today on exam, she appears to be back in normal sinus  rhythm.  We have been following this patient after cardioversion, but  this, in retrospect, may not be required.  She states that she has no  chest pain.  She is very active.  She has no exercise intolerance.  She  also has no exertional dyspnea.   CURRENT MEDICATIONS:  1. Aspirin 81 mg a day.  2. Cartia XT 200 mg a day.  3. Flecainide 100 mg p.o. b.i.d.   PHYSICAL EXAMINATION:  VITAL SIGNS:  Blood pressure 131/74, heart rate  71 beats per minute, weight 218 pounds.  GENERAL:  Well-nourished white female no apparent distress.  HEENT:  Normal.  NECK:  Normal carotid upstroke.  No carotid bruits.  LUNGS:  Clear breath sounds bilaterally.  HEART:  Regular rate and rhythm.  Normal S1, S2.  No murmurs, rubs, or  gallops.  ABDOMEN:  Soft.  EXTREMITIES:  No cyanosis, clubbing, or edema.   PROBLEM LIST:  1. Exertional dyspnea, resolved.  2. Abnormal CT scan.  Followup required.  3. Paroxysmal atrial fibrillation, suspect normal sinus rhythm.  4. History of pneumonia.  5. Coumadin anticoagulation.   PLAN:  1. The patient appears to be in normal sinus rhythm.  EKG actually      confirms this finding.  The patient can continue on flecainide.  No      cardioversion is required.  2. The patient can finish her study with [facility] regarding the      warfarin genotyping, and after 60 days, she can discontinue      warfarin.  3. We will see the patient back in 2  months and, at that time, we will      obtain a repeat CT scan      of the chest.  4. The patient also will have a 48 hour Holter monitor placed to      assure maintenance of normal sinus rhythm.     Ernestine Mcmurray, MD,FACC  Electronically Signed    GED/MedQ  DD: 12/28/2006  DT: 12/28/2006  Job #: [id number]

## 2010-12-21 NOTE — Assessment & Plan Note (Signed)
[facility] OFFICE NOTE   Taylor Weber, Taylor Weber                      MRN:          [id number]  DATE:01/06/2009                            DOB:          Oct 31, 1945    HISTORY OF PRESENT ILLNESS:  The patient is a 65 year old female with  history of atrial fibrillation.  The patient is taking propafenone in  preparation of possible cardioversion.  Unfortunately, the patient  cancelled her last appointment.  Her PT/INR level remains therapeutic.  The patient also had a history of breast cancer, which is followed by  Dr. Jacquiline Doe.  She was found to have a small medial right breast mass  suggestive of patching of silicone.  She was also seen in [facility] by  Dr. Owens Shark, radiation-oncologist for further evaluation.  The patient is  scheduled for cardioversion due to some episodic areas of shortness of  breath, but are not these are true and related to her atrial  fibrillation is not entirely clear.  She had a evaluation with the chest  x-ray, which raised concern of interstitial lung disease particularly in  the setting of possible silicone leak.  However, high-resolution CT scan  did not show any interstitial lung disease.  Pulmonary function test on  the other hand did show a severe decrease in DLCO.   The patient now was consented again to cardioversion later this week.   MEDICATIONS:  1. Coumadin as directed.  2. Diltiazem CD 240 mg a day.  3. Propafenone 150 mg p.o. t.i.d.  4. Metoprolol tartrate 25 mg half-tablet p.o. b.i.d.   PHYSICAL EXAMINATION:  VITAL SIGNS:  Blood pressure 130/80, heart rate  71, and weight is 229 pounds.  NECK:  Normal carotid upstroke.  No carotid bruits.  LUNGS:  Clear breath sounds bilaterally.  HEART:  Irregular rate and rhythm.  Normal S1 and S2.  No murmur, rubs,  or gallops.  ABDOMEN:  Soft, nontender.  No rebound or guarding.  Good bowel sounds.  EXTREMITIES:  No cyanosis,  clubbing, or edema.  NEUROLOGIC:  The patient is alert, oriented, and grossly nonfocal.   PROBLEM LIST:  1. Atrial fibrillation.      a.     Flecainide discontinued secondary to worsening dyspnea.      b.     Propafenone therapy.      c.     Bradycardia with metoprolol.  2. Chronic Coumadin.  3. Atypical chest pain.      a.     Normal coronary angiogram in December 2008.  4. Normal left ventricular function.  5. Moderate pulmonary hypertension.  6. Nonobstructive cerebrovascular disease.  7. History of breast cancer.      a.     Status post remote right breast mastectomy.      b.     Status post bilateral breast implants.      c.     Status post recent PET scan negative for metastatic breast       cancer, small medial right breast mass suggestive of benign       etiology versus silicone  leak.      d.     Right axilla and subclavicular nodes labeled as nonspecific.  8. Large silicone leak.  9. Chronic exertional dyspnea possibly related to intermittent rapid      atrial fibrillation versus lung disease (decreased DLCO).   PLAN:  1. The patient will be scheduled for cardioversion later this week.      She has agreed again to proceed.  She will need to have her      Coumadin check in the next couple of weeks very carefully.  2. I asked the patient to stop her metoprolol in the interim to make      sure that she does not have significant bradycardia and use only      p.r.n. for tachy palpitations.  3. I have also asked the patient to stop her Cardizem the day before      the procedure, but she can take her propafenone with a sip of      water.  4. The patient after cardioversion particularly she remains      symptomatic regarding shortness of breath.  We will need an      evaluation by a pulmonologist regarding the decrease in DLCO.     Ernestine Mcmurray, MD,FACC  Electronically Signed    GED/MedQ  DD: 01/06/2009  DT: 01/07/2009  Job #: [id number]

## 2010-12-21 NOTE — Letter (Signed)
July 23, 2008    Ernestine Mcmurray, MD, [facility], [place] [address]   RE:  Taylor, Weber  MRN:  [id number]  /  DOB:  April 04, 1946   Dear Luvenia Heller,   It was my pleasure to see your patient, Taylor Weber, in  electrophysiology consultation today regarding therapeutic strategies  for atrial fibrillation.  As you recall, she is a 65 year old female  with persistent atrial fibrillation.  She reports initially being  diagnosed with atrial fibrillation in February of 2008 after presenting  with atrial fibrillation with rapid ventricular rates.  She was  initially treated with Cardizem and Coumadin therapy.  She subsequently  had multiple hospitalizations for rapid ventricular rates but  spontaneously converted to sinus rhythm in December of 2008.  She  reports doing well for approximately 1 year.  She reports that on July  20 of this year, she was in an argument with her son.  She subsequently  developed heart racing.  She was found to have recurrent atrial  fibrillation and reports symptomatic atrial fibrillation since that  time.  She reports symptoms of fatigue, shortness of breath, and  decreased exercise tolerance.  She notes that she becomes dyspneic with  minimal exertion.  She has also noticed increasing lower extremity  edema.  She was therefore initiated on flecainide therapy but was unable  to tolerate this medication due to dizziness, nausea, and visual side  effects.  She has been reluctant to try any other antiarrhythmic  medications.  She was evaluated by Norm Salt at [facility] who recommended either catheter ablation or initiation of  Tikosyn.  The patient has been reluctant to pursue either strategy and  has therefore been treated with a rate control strategy alone.  She has  been initiated on Coumadin and reports tolerating this medication  without bleeding.  She is, otherwise, without complaint today.   PAST MEDICAL HISTORY:  1. Persistent atrial fibrillation.  2. Rheumatic fever as a child.  3. History of breast cancer status post modified radical mastectomy.  4. Status post bilateral breast implants.  5. Right breast implant rupture.  6. History of tobacco use.  7. Diet-controlled diabetes.  8. Abnormal coronary arteries.  9. Moderate pulmonary hypertension.  10.Status post appendectomy.  11.Status post cholecystectomy.  12.Status post total abdominal hysterectomy.   CURRENT MEDICATIONS:  1. Coumadin to maintain an INR between 2 and 3.  2. Toprol-XL 25 mg b.i.d.  3. Diltiazem 180 mg b.i.d.   ALLERGIES:  LEVAQUIN and CIPRO cause rash, DIOVAN, TALWIN.   SOCIAL HISTORY:  The patient lives in [place], [name].  She has a  15-pack-year history of tobacco but quit 25 years ago.  She denies  alcohol or drug use.   FAMILY HISTORY:  The patient has a sister with coronary artery disease.   REVIEW OF SYSTEMS:  All systems were reviewed and negative except as  outlined in the HPI above.   PHYSICAL EXAMINATION:  VITALS SIGNS:  Blood pressure 130/76, heart rate  76, respirations 18, and weight 229 pounds.  GENERAL:  The patient is a well-appearing female in no acute distress.  She is alert and oriented x3.  HEENT:  Normocephalic, atraumatic.  Sclerae clear.  Conjunctivae pink.  Oropharynx clear.  NECK:  Supple.  No JVD, lymphadenopathy, or bruits.  LUNGS:  Clear to auscultation bilaterally.  HEART:  Irregularly irregular rhythm.  No murmurs, rubs, or gallops.  GASTROINTESTINAL:  Soft, nontender, and nondistended.  Positive bowel  sounds.  EXTREMITIES:  No clubbing, cyanosis.  Trace lower extremity edema  bilaterally.  NEUROLOGIC:  Cranial nerves II through XII are intact.  Strength and  sensation are intact.  SKIN:  No ecchymosis or lacerations.  MUSCULOSKELETAL:  No deformity or atrophy.  PSYCHIATRIC:  Euthymic mood, full affect.   EKG performed recently in your office reveals atrial  fibrillation with  an average ventricular rate of 100 beats per minute with no significant  ST or T-wave changes.  The QT interval measures 430 milliseconds.   IMPRESSION:  Taylor Weber is a 65 year old female with persistent atrial  fibrillation and a history of rheumatic fever as a child who now  presents with symptomatic persistent atrial fibrillation.  She has  previously failed medical therapy with diltiazem and flecainide.  Therapeutic strategies for atrial fibrillation including with medicine  and catheter-based therapies were discussed in detail with the patient  today.  Given the patient's history of rheumatic heart disease, I  believe that her ability to achieve and maintain sinus rhythm with  catheter ablation is decreased and probably between 65% and 70%.  The  patient understands that approximately 1 in 4 patients require a repeat  procedure.  Risks, benefits, and alternatives to EP study and  radiofrequency ablation were also discussed in detail today.  The risks  include but are not limited to stroke, bleeding, vascular damage,  pericardial effusion with tamponade cardiac perforation, damage to the  esophagus, lungs and surrounding structures and pulmonary vein stenosis.  I think presently that we should consider further medical therapy with  an antiarrhythmic drug.  I think that the patient would be a good  candidate for dronedarone therapy.  We should also obtain a  transthoracic echocardiogram to evaluate for left atrial enlargement and  valvular heart disease, which could affect her outcome long term.   PLAN:  1. Dronedarone 400 mg twice daily was initiated today.  2. A transthoracic echocardiogram should be performed either in [place]      or upon followup in our office.  3. The patient will follow up in 4-6 weeks with me.   Dr. Luvenia Heller, thank you for the opportunity of participating in the care of  Taylor Weber.  Please feel free to contact me if you wish to discuss her   care further.    Sincerely,      Thompson Grayer, MD  Electronically Signed    JA/MedQ  DD: 07/23/2008  DT: 07/24/2008  Job #: [id number]

## 2010-12-21 NOTE — Assessment & Plan Note (Signed)
[facility] OFFICE NOTE   Taylor Weber, Taylor Weber                      MRN:          [id number]  DATE:12/13/2007                            DOB:          Jun 03, 1946    REFERRING PHYSICIAN:  [facility]   HISTORY OF PRESENT ILLNESS:  The patient is a 65 year old female with a  history of paroxysmal atrial fibrillation.  She is maintained in normal  sinus rhythm with flecainide.  She has been diagnosed with lymph nodes  and is in the process of having this further evaluated.  From a  cardiovascular perspective, however, the patient is doing quite well.   MEDICATIONS:  1. Flecainide 100 mg p.o. b.i.d.  2. Aspirin 81 mg p.o. daily.  3. Cartia XT 240 mg p.o. daily.   PHYSICAL EXAMINATION:  VITAL SIGNS:  Blood pressure 139/68, heart rate  74 beats per minute.  Weight 226 pounds.  NECK:  Normal carotid upstrokes and no carotid bruits.  LUNGS:  Clear breath sounds bilaterally.  HEART:  Regular rate and rhythm.  Normal S1, S2.  No murmurs, rubs, or  gallops.  ABDOMEN:  Soft, nontender.  No rebound, guarding, good bowel sounds.  EXTREMITIES:  No cyanosis, clubbing, or edema.   PROBLEM LIST:  1. Normal coronary arteries.  2. History of paroxysmal atrial fibrillation on flecainide and aspirin      therapy.  3. Moderate pulmonary hypertension.  4. Borderline hypertension.  5. Right carotid bruit.  6. Breast cancer.  7. Remote tobacco use.   PLAN:  1. The patient can continue on current medical therapy with      flecainide, Cartia, and aspirin.  CHADS score is sufficiently low      not to warrant Coumadin therapy.  2. The patient's blood pressure is very well controlled.  3. The patient can follow up with [place] in the next couple of months.  I      reviewed her EKG, and she remains in normal sinus rhythm.     Ernestine Mcmurray, MD,FACC  Electronically Signed    GED/MedQ  DD: 12/16/2007  DT: 12/16/2007  Job #:  [id number]   cc:   [facility]

## 2010-12-21 NOTE — Cardiovascular Report (Signed)
[name], [name] NO.:  [id number]   MEDICAL RECORD NO.:  [id number]          PATIENT TYPE:  OIB   LOCATION:  1963                         FACILITY:  [facility]   PHYSICIAN:  Minus Breeding, MD, FACCDATE OF BIRTH:  10-26-1945   DATE OF PROCEDURE:  07/16/2007  DATE OF DISCHARGE:                            CARDIAC CATHETERIZATION   PRIMARY:  Dr. Rory Percy.   CARDIOLOGIST:  Ernestine Mcmurray, MD.   PROCEDURE:  Left heart catheterization/coronary arteriography.   INDICATIONS:  Evaluate patient with chest pain.   PROCEDURE NOTE:  Left heart catheterization performed via the right  femoral artery.  The artery was cannulated using an anterior wall  puncture.  A #4 French arterial sheath was inserted via the modified  Seldinger technique.  The preformed Judkins and pigtail catheter were  utilized.  The patient tolerated the procedure well and left the lab in  stable condition.   RESULTS:  Hemodynamics LV 137/14, AO 135/90.  1. Coronaries left main was normal.  2. The LAD was normal.  3. First diagonal was small and normal.  4. Second diagonal was large-to-moderate size and normal.  5. The circumflex in the AV groove was normal.  6. There was a mid obtuse marginal which was large and normal.  7. The right coronary artery was a large dominant vessel.  It was      normal throughout its course.  8. The PDA was large-to-moderate size and normal.  There were 2 small      posterolaterals which were normal.   LEFT VENTRICULOGRAM:  The left ventriculogram was obtained in the RAO  projection.  The EF was 60% with normal wall motion.   CONCLUSION:  Normal coronaries.  Normal ventricular function.   PLAN:  No further cardiac workup is suggested.  The patient will follow  with Dr. Nadara Mustard for evaluation of nonanginal chest pain.      Minus Breeding, MD, [facility]  Electronically Signed     JH/MEDQ  D:  07/16/2007  T:  07/16/2007  Job:  [id number]   cc:   Lucy Chris, MD,FACC

## 2010-12-21 NOTE — Assessment & Plan Note (Signed)
[facility] CARDIOLOGY OFFICE NOTE   CORLENE, WISECARVER                      MRN:          [id number]  DATE:05/26/2008                            DOB:          [birth year]    Mrs. Searfoss is a 65 year old female with history of paroxysmal atrial  fibrillation.  When I last saw her in May, she was on flecainide,  maintaining normal sinus rhythm.  Unfortunately, in July, the patient  while at [facility] went back in atrial fibrillation and flecainide  was increased from 100 to 150 b.i.d. and she was restarted on Coumadin.  In August, she continued to feel poorly and then last when she was  admitted to the hospital still in atrial fibrillation with pneumonia.  During that hospitalization, Cardiology consult was obtained and Dr.  Stanford Breed stopped the patient's flecainide.  The patient needed  significant time to recuperate from her pneumonia, but states today that  she is feeling much better.  She was very short of breath initially, but  this has much improved.  Quite frankly, it is not clear that she can  tell whether she is in atrial fibrillation or not.  She denies any chest  pain.  She has no palpitations or syncope.   MEDICATIONS:  1. Metoprolol tartrate 25 mg b.i.d.  2. Diltiazem CD 180 p.o. daily.  3. Coumadin 5 mg p.o. daily.   PHYSICAL EXAMINATION:  VITAL SIGNS:  Blood pressure 112/71, heart rate  51, saturation 98%, weighs 224 pounds.  NECK:  Normal carotid stroke.  No carotid bruits.  HEART:  Irregular rate and rhythm with normal S1 and S2.  LUNGS:  Clear breath sounds bilaterally.  ABDOMEN:  Soft, nontender.  No rebound or guarding.  Good bowel sounds.  EXTREMITIES:  No cyanosis, clubbing, or edema.   PROBLEM LIST:  1. Normal coronary arteries.  2. Paroxysmal atrial fibrillation, now maintaining a fifth of      flecainide therapy.  3. Moderate pulmonary hypertension.  4. Right carotid bruit.  5. Breast  cancer.  6. Remote tobacco use.  7. Recent pneumonia.   PLAN:  1. The patient was explained the reason why we would prefer currently      rate control if she really is asymptomatic.  However, I told the      patient to see me back in 1 month and if she feels that she has      recurrent palpitations or symptoms, we certainly can consider a      repeat cardioversion on Coumadin with flecainide.  The patient will      also be considered for PVI/ablation in the future and I have given      her information regarding this.  2. For the time being, however, the patient seems to be doing      extremely well and has      recovered nicely from her pneumonia.  She appears to be      asymptomatic with rate control.  We will make final decisions when      the patient returns back to  clinic.     Ernestine Mcmurray, MD,FACC  Electronically Signed    GED/MedQ  DD: 05/27/2008  DT: 05/28/2008  Job #: [id number]

## 2010-12-21 NOTE — Assessment & Plan Note (Signed)
[facility] OFFICE NOTE   Taylor Weber, Taylor Weber                      MRN:          [id number]  DATE:12/02/2008                            DOB:          June 27, 1946    HISTORY OF PRESENT ILLNESS:  The patient is a 65 year old female with  history of atrial fibrillation.  The patient is currently on  propafenone.  The patient is being prepared for possible cardioversion.  The patient also has history of breast cancer which is followed by Dr.  Jacquiline Doe.  She also was found to have a small medial right breast mass  suggestive of outpouching of silicone.  She has been seen in [facility]  by Dr. Owens Shark radiation oncologist for further evaluation.   The patient still states that intermittently she has shortness of  breath.  Today, however, she is having a good day and does not feel  particularly short of breath.  Chest x-ray had raised the concern of  interstitial lung disease particularly in the setting of possible  silicone leak.  However, high-resolution CT scan did not show any  interstitial lung disease.  Pulmonary function test; however, on the  other hand showed the patient has moderate restrictive lung disease and  markedly decreased DLCO; however, I am not sure how to correlate this to  the CT scan.  The fact the patient has intermittent dyspnea is  suggestive that the atrial fibrillation might be significantly  contributing to this problem.   The patient states that she cannot do the cardioversion at this week.  We will schedule her for next week.   CURRENT MEDICATIONS:  1. Metoprolol 12.5 mg p.o. b.i.d.  2. Coumadin as directed.  3. Diltiazem CD 240 mg p.o. daily  4. Propafenone 150 mg p.o. t.i.d.   PHYSICAL EXAMINATION:  VITAL SIGNS:  Blood pressure 120/74, heart rate  85 beats per minute, weight 230 pounds.  NECK:  Normal carotid upstroke and no carotid bruits.  LUNGS:  Clear breath sounds  bilaterally.  No wheezing.  NECK:  Supple.  There is no thyromegaly.  HEART:  Irregular rate and rhythm.  Normal S1 and S2.  No murmurs, rubs,  or gallops.  ABDOMEN:  Soft, nontender.  No rebound or guarding.  Good bowel sound.  EXTREMITIES:  No cyanosis, clubbing, or edema.  NEURO:  The patient is alert, oriented, and grossly nonfocal.   PROBLEM LIST:  1. Atrial fibrillation.      a.     __________ to flecainide secondary to worsening dyspnea.      b.     On propafenone.      c.     Bradycardia with metoprolol.  2. Chronic Coumadin.  3. Atypical chest pain.      a.     Normal coronary angiogram, December 2008.  4. Normal left ventricular function.  5. Moderate pulmonary hypertension.  6. Nonobstructive cerebrovascular disease.  7. History of breast cancer.      a.     Status post remote right breast mastectomy.      b.  Status post bilateral breast implants.      c.     Status post recent PET scan, negative for metastatic breast       cancer, small medial right breast mass suggestive of benign       etiology.  Right axillary/subclavicular nodes, nonspecific.  8. Rule out silicone leak.  9. Chronic exertional dyspnea possibly related to intermittent rapid      atrial fibrillation.   PLAN:  1. The patient will be scheduled for cardioversion next week.  2. I asked the patient to stop her metoprolol in the interim to make      sure that she does not have significant bradycardia and uses only      p.r.n. for tachypalpitations.  3. I have also asked the patient to stop her Cardizem today before the      procedure, although she can continue propafenone with a sip of      water in the morning.  4. The patient has agreed to proceeding with cardioversion and discuss      risk and benefits of the procedure.     Ernestine Mcmurray, MD,FACC  Electronically Signed    GED/MedQ  DD: 12/02/2008  DT: 12/03/2008  Job #: [id number]

## 2010-12-21 NOTE — Assessment & Plan Note (Signed)
[facility]                          [name]   Taylor Weber, Taylor Weber                      MRN:          [id number]  DATE:01/22/2009                            DOB:          [birth year]    REFERRING PHYSICIAN:  Gar Ponto   HISTORY OF PRESENT ILLNESS:  A 65 year old female with history of atrial  fibrillation.  The patient is status post cardioversion on flecainide  therapy.  She is maintaining normal sinus rhythm.  She states that she  is feeling much better after cardioversion.  She has no chest pain,  short of breath, orthopnea, or PND.   MEDICATIONS:  1. Coumadin 5 mg as directed.  2. Propafenone 150 mg p.o. t.i.d.  3. Cardizem CD 120 daily.  4. Metoprolol tartrate 25 mg half tablet p.o. b.i.d.   PHYSICAL EXAMINATION:  VITAL SIGNS:  Blood pressure 139/76, heart rate  51, and weight 225 pounds.  HEENT:  Pupils, eyes are equal.  Conjunctivae clear.  NECK:  Supple.  Normal carotid upstroke.  No carotid bruits.  LUNGS:  Clear breath sounds bilaterally.  HEART:  Regular rate and rhythm.  Normal S1 and S2.  No murmurs, rubs,  or gallops.  ABDOMEN:  Soft, nontender.  No rebound or guarding.  Good bowel sounds.  EXTREMITIES:  No cyanosis, clubbing, or edema.  NEUROLOGIC:  The patient is alert, oriented, and grossly nonfocal.   PROBLEM LIST:  1. Atrial fibrillation, status post cardioversion, maintain normal      sinus rhythm.  2. Propafenone therapy.  3. Coumadin therapy.  4. Atypical chest pain with normal coronary angiogram in December      2008.  5. Mild left ventricular function.  6. Pulmonary hypertension, nonobstructive cerebrovascular disease.  7. Breast cancer.      a.     Status post remote right breast mastectomy.      b.     Status post bilateral breast implants.      c.     Status post recent PET scan negative for metastatic breast       cancer, small medial right breast mass suggestive of benign  etiology versus silicone leak.      d.     Right axis liver nodes labeled as nonspecific.  8. Silicone leak.  9. Chronic exertional dyspnea related to intermittent atrial      fibrillation versus lung disease, decreased DLCO.   PLAN:  1. The patient is much and much better after cardioversion.  She is      now less short of breath, I do not think she has immediate      pulmonary referral.  2. The patient maintain normal sinus rhythm.  If she maintains sinus      rhythm for 3 weeks, we can discontinue her Coumadin.  3. The patient can follow up with [place] in 3 months.     Ernestine Mcmurray, MD,FACC  Electronically Signed    GED/MedQ  DD: 01/22/2009  DT: 01/23/2009  Job #: [id number]   cc:   Gar Ponto

## 2010-12-21 NOTE — Assessment & Plan Note (Signed)
[facility] OFFICE NOTE   Taylor Weber, Taylor Weber                      MRN:          [id number]  DATE:02/22/2007                            DOB:          1946-06-16    CARDIOLOGY OFFICE NOTE   CARDIOLOGIST:  Dr. Terald Sleeper.   PRIMARY CARE PHYSICIAN:  She sees a Anastasia Tompson at [facility].   HISTORY OF PRESENT ILLNESS:  Taylor Weber is a 65 year old female patient  with a history of persistent atrial fibrillation maintaining sinus  rhythm on flecainide therapy.  She recently came off of the Coumadin  therapy after finishing a study at the [facility].  She also has a  history of breast cancer status post right mastectomy.  She had had a  chest CT scan done some time ago that revealed right-sided pulmonary  nodules as well as enlarged lymph nodes in the aortopulmonary window.  Followup CT scan was recommended in 6 to 12 months.  She returns today  for followup.  She notes that, since we saw her, she developed a rash on  her lower extremities when she was at [facility].  While there, she  did have a CT scan.  Apparently, there was a change on her CT scan and  she was set up with oncology here in [place].  She has seen Dr. Jacquiline Doe.  She apparently needs another scan some time in a month.  She apparently  has a followup appointment with him in August.  She is quite concerned  about not getting a chance to see him until August, because she has  developed some redness on her chest.  She is also quite nervous about  her possible diagnosis.  She says that she may have lymphoma.  She would  like to get in to see him sooner.  She also notes recent abdominal  infection.  It sounds like she is describing diverticulitis, and she is  currently on Flagyl and Bactrim.  She denies palpitations, denies chest  pain.  Denies significant shortness of breath.  Denies syncope or near-  syncope.  She does note quite a bit of anxiety.   CURRENT MEDICATIONS:  1. Cartia XT 300 mg daily.  2. Flecainide 100 mg b.i.d.  3. Bactrim.  4. Flagyl.  5. Aspirin 81 mg daily.   ALLERGIES:  LEVAQUIN, TALWIN, DARVON, [place].   PHYSICAL EXAM:  She is a well-developed, well-nourished female in no  acute distress.  VITAL SIGNS:  Pending.  HEENT:  Normal.  NECK:  Without JVD.  LYMPHATICS:  Without lymphadenopathy.  CARDIAC:  Normal S1, S2.  Regular rate and rhythm.  LUNGS:  Clear to auscultation bilaterally.  ABDOMEN:  Soft and nontender.  EXTREMITIES:  Without edema.   IMPRESSION:  1. Persistent atrial fibrillation.      a.     Maintaining sinus rhythm on flecainide therapy.      b.     No longer on Coumadin - now on aspirin therapy.  2. History of exertional dyspnea.      a.     Pulmonary hypertension.  b.     Abnormal CT scan.  3. Ruled out for coronary artery disease with negative cardiac CT in      2006 and negative Cardiolite study March of 2008.  4. Good left ventricular function.  5. History of breast cancer.  6. Recent abnormal chest CT.      a.     Now followed by Dr. Jacquiline Doe.  7. Anxiety.   PLAN:  The patient presents to the office today for routine followup.  Overall, she is doing well from a cardiovascular standpoint.  She was  apparently told at [facility] that she needed to come off of her  Cardizem.  She was apparently told that she had bradycardia.  At this  point in time, she is maintaining sinus rhythm and doing well.  I think  leaving her on her Cartia and flecainide is in order.  She will continue  all of the medications as listed above.  She has asked for something for  anxiety.  I have given her Xanax 0.25 mg q.12h p.r.n.  She will get #15  and no refills.  She will get refills on this with her primary care  Metha Kolasa.  I will make sure she has followup with Dr. Dannielle Burn in the next  6 to 8 weeks.   I have asked her to go ahead and contact Dr. Reynaldo Minium office to let  them know that she is  having some more complications and that she wants  to be seen sooner, so that she can get a sooner appointment than August.   ADDENDUM:  Her blood pressure today was 144/70.  Electrocardiogram  reveals sinus rhythm with a heart rate of 61.  No acute changes.      Richardson Dopp, PA-C  Electronically Signed      Ernestine Mcmurray, MD,FACC  Electronically Signed   SW/MedQ  DD: 02/22/2007  DT: 02/23/2007  Job #: [id number]   cc:   Marko Stai. Darovsky, M.D.

## 2010-12-21 NOTE — Assessment & Plan Note (Signed)
[place] CARDIOLOG[facility] OFFICE NOTE   ANGELEIGH, WURTH                      MRN:          [id number]  DATE:04/17/2007                            DOB:          1946-05-10    HISTORY OF PRESENT ILLNESS:  The patient is a 65 year old female patient  with a history of atrial fibrillation. The patient has maintained normal  sinus rhythm with flecainide therapy. She has been doing quite well. She  reports no palpitations or syncope, dizziness or weakness.   On EKG today, she remains in normal sinus rhythm.   She reports no side effects from flecainide.   MEDICATIONS:  1. Cartia XT 3 mg p.o. daily.  2. Flecainide 100 mg p.o. b.i.d.  3. Aspirin 81 mg p.o. daily.   PHYSICAL EXAMINATION:  VITAL SIGNS: Blood pressure 139/75, heart rate  65. The patient weighs 221 pounds.  NECK: Normal carotid upstroke. No carotid bruits.  LUNGS:  Clear breath sounds bilaterally.  HEART: Regular rate and rhythm. Normal S1, S2. No murmur, rubs or  gallops.  ABDOMEN: Soft and nontender. No rebound or guarding. Good bowel sounds.  EXTREMITIES: No cyanosis, clubbing or edema.   PROBLEM LIST:  1. Atrial fibrillation. Currently normal sinus rhythm.  2. Low thromboembolic risk, on aspirin therapy only.  3. Exertional dyspnea improved.  4. Abnormal CT scan followed by Dr.  Jacquiline Doe with mediastinal nodes.  5. Normal left ventricular function.  6. Breast cancer.  7. Anxiety.   PLAN:  1. The patient is doing quite well from a cardiovascular perspective.      She remains in normal sinus rhythm.  2. The patient does not need any further cardiovascular testing at the      present time. We have given her refills on her medication and she      will see [place] back in six months.     Ernestine Mcmurray, MD,FACC  Electronically Signed    GED/MedQ  DD: 04/17/2007  DT: 04/17/2007  Job #: [id number]   cc:   [place]

## 2010-12-21 NOTE — Assessment & Plan Note (Signed)
Taylor Weber                                 ON-CALL NOTE   Taylor Weber, Taylor Weber                      MRN:          [id number]  DATE:10/26/2008                            DOB:          22-Nov-1945    Patient of Dr. Dannielle Burn.   I received a call from Ms. Taylor Weber this evening stating that she is on  propafenone for about a month as well as metoprolol 25 mg b.i.d. and  diltiazem daily.  She says this evening, she has noticed that her blood  pressure has been running just above [id number] systolically while her heart  rates have been in the 50s to high 40s.  She has to take a dose of  propafenone tonight.  I recommended that if her heart rate is less than  50 she should hold the dose of propafenone and call into the office in  the morning as she may need an adjustment.  I further recommended that  she decrease metoprolol dose to 12.5 mg b.i.d., and she will start that  in the morning.  She will give a callback with any additional questions  and said she has a nurse appointment this week.     Murray Hodgkins, ANP  Electronically Signed    CB/MedQ  DD: 10/26/2008  DT: 10/27/2008  Job #: [id number]

## 2010-12-21 NOTE — Assessment & Plan Note (Signed)
[place] OFFICE NOTE   KIRALYNN, FOREHAND                      MRN:          [id number]  DATE:03/11/2008                            DOB:          1946/04/05    Ms. Zierke is seen for cardiology followup.  She has a history of  paroxysmal atrial fib.  She went back into atrial fib recently.  She  knows that it occurred on February 25, 2008.  She feels poorly with it but  not on stable.  She was actually in the [facility] area and while  hospitalized there, her meds were adjusted with her flecainide dose  increased from 100 b.i.d. to 150 b.i.d.  She was started on Coumadin.  Low-dose metoprolol was added and her diltiazem dose was cut from 240 a  day to 180 a day.  She continues to feel poorly.  Her exercise tolerance  has decreased.  She is not having chest pain.  There is no marked  shortness of breath.  She has had some mild dizziness but no syncope or  presyncope.   PAST MEDICAL HISTORY:   ALLERGIES:  LEVAQUIN, TALWIN, DARVON and some type of NOVOCAIN.   MEDICATIONS:  1. Aspirin 81.  2. Warfarin currently being dosed through our office.  3. Metoprolol 25 b.i.d.  4. Diltiazem 180 daily.  5. Flecainide 150 b.i.d.   OTHER MEDICAL PROBLEMS:  See the list below.   REVIEW OF SYSTEMS:  Other than her fatigue and symptoms from her atrial  fib, her review of systems is negative.   PHYSICAL EXAMINATION:  VITAL SIGNS:  Blood pressure is 124/84 with a  heart rate of 98.  Weight is 227 pounds.  GENERAL:  The patient is oriented to person, time and place.  Affect is  normal.  HEENT:  No xanthelasma.  She has normal extraocular motion.  NECK:  There are no carotid bruits.  There is no jugular venous  distention.  LUNGS:  Clear.  Respiratory effort is not labored.  CARDIAC:  An irregularly irregular rhythm.  There are no significant  murmurs.  ABDOMEN:  Soft.  EXTREMITIES:  She has no significant peripheral  edema.   Her EKG reveals atrial fibrillation.   PROBLEMS:  1. History of normal coronary disease documented in the past.  2. History of paroxysmal atrial fibrillation.  In early 2008, there      was consideration of cardioversion, but she converted spontaneously      on her own.  She now has persistent atrial fibrillation and she is      symptomatic with it.  Her flecainide dose has been increased.  We      will obtain a flecainide level to be sure that it is within the      acceptable range.  Also, we will recheck exactly what her Coumadin      status is.  She has been followed and she has been relatively      stable here.  Hopefully, we can proceed with cardioversion soon.  3. History of moderate pulmonary hypertension.  4. History  of borderline hypertension, stable.  5. History of right carotid bruit.  It is my understanding that it has      been assessed over time and will be thought about in the future.  6. History of breast cancer.  7. History of remote tobacco use.  8. History of some type of lung nodule.  A CT scan was done on March 05, 2008, and compared to the study of March 28, 2007.  She has      bilateral breast implants.  The study showed a tiny nodule in the      right middle lobe.  It was felt that there was no significant      change.  It was small and calcified.  They are both calcified and      noncalcified nodules.  It was felt that with a history of some      smoking in the past that a followup CT in another year was      recommended.   At this point, the patient is symptomatic from her atrial fib.  We will  check a flecainide level to be sure it is appropriate and we will re-  review her Coumadin dosing here in the past few weeks.  As soon as she  has been fully therapeutic for 3 weeks, we will proceed with  cardioversion.     Carlena Bjornstad, MD, [facility]  Electronically Signed    JDK/MedQ  DD: 03/11/2008  DT: 03/11/2008  Job #: [id number]   cc:    [place]

## 2010-12-21 NOTE — Assessment & Plan Note (Signed)
[place] OFFICE NOTE   Taylor Weber, Taylor Weber                      MRN:          [id number]  DATE:08/06/2007                            DOB:          09-28-1945    PRIMARY CARDIOLOGIST:  Dr. Terald Sleeper.   REASON FOR VISIT:  Post hospital follow-up.   The patient returns to the clinic after undergoing elective cardiac  catheterization on December 8, by Dr. Minus Breeding.  She was found to  have normal coronary arteries and normal left ventricular function with  no wall motion abnormalities.   The patient was referred for the procedure by Dr. Dannielle Burn, who saw her  here at [facility], on November 25, for evaluation of atypical  chest pain.  She ruled out for MI with negative serial cardiac markers.  She also has history of paroxysmal atrial fibrillation and continues to  maintain NSR on flecainide.  She is not on Coumadin, given a history of  a low [place] score.   The patient is also noted to have history of breast cancer, status post  mastectomy, and an abnormal chest CT scan, which is followed by Dr.  Jacquiline Doe.  She also has a history of anxiety.   Electrocardiogram today reveals NSR at 62 BPM with normal axis and  nonspecific ST changes.   Of note, patient has not had any interim tachy palpitations since last  seen.  She did have some exertional dyspnea, which resolved  spontaneously.   CURRENT MEDICATIONS:  1. Flecainide 100 b.i.d.  2. Cartia XT 240 daily.  3. Aspirin 81 daily.  4. HCTZ 25 mg p.r.n.   PHYSICAL EXAMINATION:  Blood pressure 135/79, pulse 63, regular, weight  224.  GENERAL:  A 65 year old female sitting upright in no distress.  HEENT:  Normocephalic/atraumatic.  NECK:  Palpable bilateral carotid pulses; soft right supraclavicular  bruit.  LUNGS:  Clear to auscultation all fields.  HEART:  Regular rate and rhythm (S1-S2), no significant murmurs.  ABDOMEN:  Protuberant,  nontender.  EXTREMITIES:  Right groin stable, no ecchymosis, hematoma, or bruit on  auscultation.  Intact distal pulses.  NEURO:  Flat affect, but no focal deficit.   IMPRESSION:  1. Atypical chest pain.      a.     Normal recent coronary angiogram  2. History of paroxysmal atrial fibrillation.      a.     Maintaining NSR on flecainide.      b.     CHAD2 score less than 2.  3. Pulmonary hypertension.      a.     Moderate (PASP 45-50) by 2-D echo, February 2008.  4. Borderline hypertension.  5. Anxiety.  6. Soft, right carotid bruit.  7. History of breast cancer.      a.     Status post right breast mastectomy, at age 13.      b.     Status post bilateral breast implants.  8. Remote tobacco.   PLAN:  1. Schedule carotid Dopplers for assessment of noted right carotid      bruit.  2.  Add hydrochlorothiazide 12.5 mg daily, for better modulation of      blood pressure and reported intermittent shortness of breath, for      which patient has treated herself with p.r.n. hydrochlorothiazide.  3. Follow-up BMET/blood pressure check in 2 weeks.  4. Return clinic follow-up with myself and Dr. Dannielle Burn in 3 months.      Gene Serpe, PA-C  Electronically Signed      Ernestine Mcmurray, MD,FACC  Electronically Signed   GS/MedQ  DD: 08/06/2007  DT: 08/06/2007  Job #: [id number]

## 2010-12-24 NOTE — Assessment & Plan Note (Signed)
[place] OFFICE NOTE   PORCHE, QUAGLIATA                      MRN:          [id number]  DATE:11/22/2006                            DOB:          [birth year]    HISTORY OF PRESENT ILLNESS:  The patient is a 65 year old female with a  history of atrial fibrillation which has now become more permanent. The  patient was started during her last hospitalization on flecainide  therapy. She is currently on 100 mg twice a day. She saw Dr. Lovena Le in  the office recently for a second opinion. The plan is to proceed with  eventual cardioversion to maintain normal sinus rhythm. The patient has  also been maintained on Coumadin therapy. She has had intermittent  episodes of tachypalpitations but also has felt bad because of slow  heart rates. She has called our physician's assistant and adjustments  were made in her medications, particularly digoxin was held and the  patient alternated on her doses of Cartia XT between 240 and 360 mg a  day. Today in the office, she comes for a Coumadin check, her INR is  2.2. On EKG, she remains in atrial fibrillation with a heart rate that  is reasonably well controlled. On EKG her heart rate is 95 beats per  minute. She took 2 Cartia XT 240 mg this morning. Her INR is 2.2.   I had a discussion with the patient in the office today as to how to  manage her atrial fibrillation. We will now place her on Cartia XT 300  mg a day and will hold Digoxin for now. The patient will continue on  Coumadin and she will need another 2 weeks of therapeutic  anticoagulation. Once this is achieved, the patient can then proceed  with elective cardioversion. She will also need to continue on  Flecainide 100 mg p.o. twice a day. I will given the patient a  prescription for Coumadin today and her Coumadin dosing has been  appropriately adjusted. The patient will plan to see me back in the next  couple of  weeks for an elective cardioversion.     Ernestine Mcmurray, MD,FACC  Electronically Signed    GED/MedQ  DD: 11/22/2006  DT: 11/22/2006  Job #: [id number]

## 2010-12-24 NOTE — Assessment & Plan Note (Signed)
[place]                                 ON-CALL NOTE   ESTEL, ALMON                        MRN:          [id number]  DATE:10/20/2006                            DOB:          Mar 19, 1946    DATE OF TELEPHONE CALL:  October 20, 2006, at 2127.   I received a page through the answering service from [place], with [employer].  I returned a call at area code [phone], concerning Ms. Patton Salles, a patient of Dr. Arlina Robes, date of birth [birth year].  Minette Brine stated they had received a call or a trigger from Ms. Sterling and  they spoke with her.  She states that she was having a stress test done  and went into atrial fib, heart rate greater than 110 x3 minutes and  then rate dropped to the 90's.  I informed Minette Brine that there was no way  Ms. Frothingham had gone into atrial fib having a stress test at this time of  night.  She stated she did not know the details of it, she was just  relaying the message.  I then called Ms. Buttry at her home number, [phone], and spoke with her directly.  Further discussion, apparently  she was having a stress test this afternoon at [facility] test had been ordered by Dr. Dannielle Burn.  She states that when she  went to do the stress test the technician told her that she was in  atrial fib.  Ms. Hankerson states she has had a history of atrial fib x1  month, it sounds like it has been paroxysmal.  She has been treated with  aspirin and Cardizem.  She stated that she did not have any chest  discomfort, does not have palpitations with it, she just feels weak at  times, denied lightheadedness or dizziness.  Apparently proceeded with  the stress test with no complications and returned home but just  notified the [employer] monitoring system this evening.  I instructed her to  follow up with Dr. Dannielle Burn as previously scheduled and if there was any  change in her symptoms or status she wished to seek medical assistance  immediately.  She verbalized understanding of the instructions.      Rosanne Sack, ACNP  Electronically Signed      Wallis Bamberg. Johnsie Cancel, MD, [facility]  Electronically Signed   MB/MedQ  DD: 10/20/2006  DT: 10/22/2006  Job #: [id number]

## 2010-12-24 NOTE — Assessment & Plan Note (Signed)
[place] OFFICE NOTE   Taylor, Weber                      MRN:          [id number]  DATE:11/06/2006                            DOB:          [birth year]    Taylor Weber is referred today by Dr. Terald Sleeper for evaluation of atrial  fibrillation with a rapid ventricular response.   HISTORY OF PRESENT ILLNESS:  The patient is a very pleasant 65 year old  woman who has had a history of atrial fibrillation now for several  months.  The patient has had hospitalizations with rapid atrial  fibrillation and has been seen in the emergency department 8 times in  the last 3 months secondary to rapid atrial fibrillation.  She was  initially tried on AV nodal blocking drugs but continued to have  breakthroughs of atrial fibrillation and was recently placed on  flecainide by Dr. Dannielle Burn at 100 mg twice a day.  This has resulted in  some improvement in her atrial fibrillation rate, though she maintains  atrial fibrillation and has not gone back to sinus rhythm.  The patient  denies any frank syncope, but does feel chest pain and shortness of  breath with her atrial fibrillation.  She also has palpitations.   PAST MEDICAL HISTORY:  1. Breast cancer with right mastectomy in the past.  2. She does not have hypertension.  3. Her diabetes has been diet controlled.   FAMILY HISTORY:  Positive for scleroderma and atrial fibrillation.   REVIEW OF SYSTEMS:  Negative for vision or hearing problems.  She does  have a remote history of rheumatic fever.  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  The rest of her review of systems  was reviewed and found to be negative, except as noted.   PAST SURGICAL HISTORY:  1. History of gallbladder disease.  2. She is status post appendectomy.  3. She is status post hysterectomy.   PHYSICAL EXAMINATION:  GENERAL:  A pleasant, well-appearing 65 year old  woman in  no distress.  VITAL SIGNS:  Blood pressure today was 126/77, pulse 63 and regular,  respirations 18, weight 217 pounds.  HEENT:  Normocephalic and atraumatic.  Pupils equal and round.  Oropharynx was moist.  Sclerae were anicteric.  NECK:  Revealed no jugular venous distention.  There was no thyromegaly.  Trachea was midline.  The carotids were 2+ and symmetric.  LUNGS:  Clear bilaterally to auscultation.  There were no wheezes,  rales, or rhonchi.  There was no increased work of breathing.  CARDIOVASCULAR:  Irregularly irregular rhythm, with normal S1 and S2.  The PMI was not enlarged, nor was it laterally displaced.  ABDOMEN:  Obese, nontender, nondistended.  There was no organomegaly.  EXTREMITIES:  Demonstrated no cyanosis, clubbing, or edema.  The pulses  were 2+ and symmetric.  NEUROLOGIC:  Alert and oriented x3, with cranial nerves intact.  The  strength was 5/5 and symmetric.   Her EKG demonstrates atrial fibrillation, with a controlled ventricular  response.  Ambulatory pulse oximetry monitoring demonstrated her maximum  heart rate was 130 beats per minute  with vigorous walking around our  office.  No oxygen desaturation was noticed.   IMPRESSION:  1. Symptomatic atrial fibrillation.  2. Diabetes (borderline).   DISCUSSION:  I have recommended that the patient continue on flecainide  100 twice a day for now.  Continue her Coumadin.  Continue digoxin.  Continue diltiazem 360 mg daily.  I have offered her the option of a TEE-  guided cardioversion versus continued Coumadin and cardioversion in the  next several weeks.  She may pop back into sinus rhythm on her own.  If  she does not, then DC cardioversion will be in order.  Alternatively,  antiarrhythmic drug therapy would also be considered if she fails to  maintain sinus rhythm on flecainide.  I will plan to see her back in  several months.     Champ Mungo. Lovena Le, MD  Electronically Signed    GWT/MedQ  DD: 11/06/2006   DT: 11/07/2006  Job #: [id number]   cc:   Ernestine Mcmurray, MD,FACC

## 2010-12-24 NOTE — Assessment & Plan Note (Signed)
[facility]                          Taylor Weber   Taylor Weber, Taylor Weber                      MRN:          [id number]  DATE:10/09/2006                            DOB:          1946/06/22    REFERRING PHYSICIAN:  Jeronimo Greaves, M.D.   HISTORY OF PRESENT ILLNESS:  The patient is a 65 year old female who was  recently admitted on February17, 2008, to [facility] with  paroxysmal atrial fibrillation with rapid ventricular response.  The  patient also was diagnosed with possible pneumonia with right lower lobe  infiltrate, fevers up to 103, but interestingly with no elevated white  count.  We saw the patient in consultation to address her atrial  fibrillation.  She had a low CHAD-2 score and was felt not to be in need  of Coumadin anticoagulation.  The patient was rate controlled and placed  on Cardizem.  CT scan also was performed after an echocardiogram study  demonstrated pulmonary hypertension.  PA Pressures were 55-60 mmHg.  CT  scan demonstrated no evidence of chronic thromboembolic disease.  Of  Weber was, however, that per radiologist, there were multiple areas of  ground glass appearance with infiltrative densities and follow up is  required to rule out active or neoplastic process versus pneumonia  and/or fibrosis.  Of Weber also, this patient had a cardiac CT scan in  2006 which showed no significance of coronary artery disease.  After the  patient was discharged, which I presume was still in atrial fibrillation  on medical therapy, she required two more emergency room visits for  recurrent atrial fibrillation.  She is now being set up for early office  appointment.  The patient does state that she felt on Friday that she  reverted back to normal rhythm.  Unfortunately, she remains rather short  of breath and she states that she tires very quickly.  She has no  exertional chest pain, but has exertional dyspnea.  She has no  orthopnea, PND and no recurrent palpitations since Friday.   MEDICATIONS:  1. Aspirin 81 mg daily.  2. Cartia XT 240 mg daily.   PHYSICAL EXAMINATION:  VITAL SIGNS:  Blood pressure 139/71, heart rate  71 beats per minute, weight 219 pounds.  GENERAL:  Well-nourished white female in no apparent distress.  HEENT:  Pupils equal, round and reactive to light.  NECK:  Supple.  No carotid jugular.  No carotid bruits.  LUNGS:  Clear breath sounds bilaterally.  HEART:  Regular rate and rhythm.  Normal S1, S2.  No S3.  No murmurs,  rubs or gallops.  ABDOMEN:  Soft, nontender, no rebound or guarding.  Good bowel signs.  EXTREMITIES:  No cyanosis, clubbing or edema.  NEUROLOGICAL:  The patient is alert, oriented and grossly nonfocal.   PROBLEMS:  1. Exertional dyspnea.      a.     Pulmonary hypertension. PA pressure 55-60 mmHg.      b.     Abnormal CT scan.  See details above.  Rule out fibrosis       versus infiltrate process of 'pneumonia or  malignancy.      c.     Rule out coronary artery disease (negative cardiac CT scan       2006).      d.     Paroxysmal atrial fibrillation (abnormal sinus rhythm).  2. Paroxysmal atrial fibrillation, now normal sinus rhythm on      Cardizem.  3. Status post pneumonia with abnormal CT scan.  See details above.   PLAN:  1. The patient continues to complain of exertional dyspnea.  This      could be multifactorial, particularly in light of her pulmonary      hypertension and abnormal CT scan.  2. The patient will have a repeat CT scan done in four weeks to follow      up on the infiltrates and needs maybe a pulmonary referral if there      is concern about malignancy.  3. I doubt the patient's atrial fibrillation is contributing to her      dyspnea.  She now remains in normal sinus rhythm.  We will,      however, apply cardiac monitor to see if she has paroxysmal spells.      The patient may need short acting Cardizem ES if she has recurrent       breakthroughs.  4. As discussed previously, no clear indication for Coumadin      anticoagulation at the present time.  5. The patient also will have a stress test to make sure she has not      developed underlying coronary artery disease in the interim.  6. Plan to see the patient back in 4-6 weeks.     Ernestine Mcmurray, MD,FACC  Electronically Signed    GED/MedQ  DD: 10/09/2006  DT: 10/09/2006  Job #: [id number]   cc:   Langley Gauss, M.D.  [place] [address]. Jeronimo Greaves

## 2010-12-24 NOTE — Assessment & Plan Note (Signed)
[name]                                 ON-CALL NOTE   Taylor Weber, Taylor Weber                        MRN:          [id number]  DATE:11/26/2006                            DOB:          January 18, 1946    She is a patient of Dr. Arlina Robes. Her phone number is [phone]. Miss  [name] called in this morning at 11:45 reporting that she has a history  of atrial fibrillation for which she is treated with flecainide 100  b.i.d. and had previously also been on Diltiazem 360 mg daily which was  decreased because she was feeling poorly. She had been on 240 for about  a week and when she saw Dr. Dannielle Burn on April 16th she reported some tachy  palpitations and her heart rate was in the 90s and therefore he upped  the Diltiazem dose to 300 mg daily. Last night after taking her Cardizem  she measured her heart rate at 42 and she says she felt lousy. She was  asking what she should do. Of note she is traveling currently and is in  [place]. She does have her 300 mg tablets but not lower dose of  Diltiazem. I advised that if her heart rate is less than 60 this evening  she should hold the Diltiazem and tomorrow she should resume taking 240  mg with close attention to her heart rate throughout the day. She  verbalized understanding.     Murray Hodgkins, ANP  Electronically Signed    CB/MedQ  DD: 11/26/2006  DT: 11/26/2006  Job #: [id number]

## 2010-12-24 NOTE — Assessment & Plan Note (Signed)
[facility]                                 ON-CALL NOTE   CIERRA, JOHAL                      MRN:          [id number]  DATE:11/19/2006                            DOB:          13-Jan-1946    Initial contact was November 18, 2006, supervising physician Dr. Percival Spanish.  Second contact was November 19, 2006, supervising physician Dr. Caryl Comes.   PRIMARY CARDIOLOGIST:  Dr. Terald Sleeper.   SUMMARY HISTORY:  Ms. Lammert is a 65 year old female who has been  evaluated in the past by Dr. Dannielle Burn and Lovena Le for atrial fibrillation  with a rapid ventricular rate.  She initially called on Saturday at  approximately 2:30 p.m.  She states that since yesterday she has noticed  a decreased pulse rate in the low 40s.  She stated on Friday her blood  pressure initially was maintained in the 120s.  However, she did speak  to somebody at the office, she is not sure who she spoke with, and they  told her to continue taking her medicines.  On Saturday she stated that  all morning she felt faint.  She also has been monitoring her blood  pressure and it is now in the 90s.  She has also complained of nausea  but has not vomited.  She was very reluctant to proceed to the emergency  room because she has had so many ER visits recently.  I reviewed her  medications with her and she is taking flecainide, Cardizem 360,  Digoxin.  She denied any problems with chest discomfort, actual syncope,  or visual disturbances.  I discussed with Dr. Percival Spanish and he stated  that she should go have an EKG performed at the emergency room.  However, after discussing with Ms. Gilleland she was reluctant to do so.  I  explained to her that this was the only way that she would have a  thorough evaluation.  She still did not want to go to the emergency  room.  Therefore, I asked her not to drive, and on Sunday not to take  her Digoxin or Cardizem; but to continue to monitor her blood pressure  and her heart  rate.  I asked her to continue her flecainide.  I also had  explained at any reason, if her symptoms became worse or her pulse  dropped below 40, or her blood pressure dropped below 80, she needed to  proceed to the emergency room whether she wanted to or not.  She  understood this.  I have explained to her that I would inform the office  Monday morning as to today's events and they would be contacting her  with a followup appointment.  I had asked her not to take her Cardizem  and Digoxin Monday morning until the office spoke with her.  She may  need a Digoxin level given her nausea and bradycardia.  She also may  need adjustment on her Cardizem given her hypotension and bradycardia.   On Sunday, at approximately 3:30 I spoke with Ms. Ogilvie again.  She  stated that she felt  much, much better and was no longer having any more  feeling of faintness or nausea.  She had held the Cardizem and Digoxin  on Sunday morning as suggested.  She has been monitoring her blood  pressure and she states that her blood pressure is now back up to the  100s, 120s as usual.  However, she states that throughout the day she  has been monitoring her heart rate, and she said that on Sunday morning  it had increased to the 60s, but by this afternoon it had increased to  the 90s and low 100s.  She further described that she had several doses  of Cardizem, i.e., 180, 240, as well as the 360.  I asked her to go  ahead and take the 240 mg Cardizem and continue to monitor her pulse and  her heart rate.  I asked her not to take any medication on Monday  morning until the office had contacted her.  I had also asked her not to  take the Digoxin on Sunday evening until a level was drawn.  She is  agreeable with this plan.  I reiterated to her that if she should  develop any worsening symptoms or further difficulties with her pulse or  heart rate she should proceed to the nearest emergency room, she  understood.   On  Monday morning, April 14 I contacted [facility] office to inform them  of the weekend events.  They will be contacting Ms. Wollan to arrange a  followup appointment.     Sharyl Nimrod, PA-C  Electronically Signed    EW/MedQ  DD: 11/20/2006  DT: 11/20/2006  Job #: [id number]   cc:   Champ Mungo. Lovena Le, MD

## 2010-12-24 NOTE — Assessment & Plan Note (Signed)
[facility] OFFICE NOTE   SENORA, KETCHUM                      MRN:          [id number]  DATE:11/01/2006                            DOB:          [birth year]    HISTORY OF PRESENT ILLNESS:  The patient is a 65 year old female with  history of new onset paroxysmal atrial fibrillation initially diagnosed  on September 26, 2006.  The patient, at that time, was admitted to the  hospital.  It was felt that her CHADS score was low.  It was felt that  she did not need Coumadin anticoagulation.  The patient also was ruled  out for pulmonary embolism, and had a 2D echocardiographic study done.  She had a CardioNet monitor applied as an outpatient.  This showed  frequent atrial arrhythmias.  As a matter of fact, the patient has been  feeling very poorly with rapid heart rate despite initiation with  Cardizem therapy.  Patient was also diagnosed with a possible pneumonia  with right lower left infiltrate.  Had fevers up to 103, but with no  definite white count.  We saw the patient in consultation to adjust her  atrial fibrillation, and had a low CHADS2 score, and was felt not to be  in need of Coumadin anticoagulation as the plan above.  Patient was rate  controlled, and placed on Cardizem CD.  __________ was performed as  outlined above.  Echo showed PA pressure of 55 to 60 mm of mercury.  There was no evidence of thromboembolic disease.  There was some ground  glass appearance, which required followup on the CT scan.  Patient also  had a cardiac CT scan in 2006, which showed no significant coronary  artery disease.  Since her hospital discharge, patient required several  emergency room visits for her current atrial fibrillation.  She has now  called our office due to symptoms of feeling poorly.  She feels  fatigued, weak, and short of breath on minimal exertion.  She actually  spoke with me on the phone yesterday,  and stated that she feels that her  heart goes sometimes fast, and then at other times a slower heart rate.  She feels like, she states, that she is on a roller coaster ride.  She  reports shortness of breath on minimal exertion.  The patient also  presented last night at 1 o'clock to the emergency room due to some  right upper quadrant pain, which has now resolved.  In the office today,  the patient remains in rapid atrial fibrillation, and is feeling poorly.   MEDICATIONS:  1. Aspirin 81 mg a day.  2. Cardizem CD 240 mg p.o. daily.   ALLERGIES:  1. TALWIN.  2. DARVON.  3. EPINEPHRINE.  4. LEVAQUIN.   PAST MEDICAL HISTORY:  See problem list below.   SOCIAL HISTORY:  The patient lives in [place] with her husband  Merry Proud.  She has not smoked in 30 years.  Denies any alcohol use.   FAMILY HISTORY:  Sister, age 69, underwent bypass surgery status post  myocardial infarction in  her 10s.  Childhood rheumatic fever, status  post pacemaker.  Son, age 57, history of myocardial infarction in his  65s.   REVIEW OF SYSTEMS:  The patient denies any nausea or vomiting.  No fever  or chills.  No melena, hematochezia, dysuria, or frequency.   PHYSICAL EXAMINATION:  VITAL SIGNS:  Blood pressure 114/70.  Heart rate  126 beats per minute.  Weight is 219.  GENERAL:  An overweight white female, in no apparent distress.  HEENT:  Pupils are isochoric.  NECK:  Supple.  Normal carotid upstroke.  No carotid bruits.  LUNGS:  Clear breath sounds bilaterally.  HEART:  Irregular rate and rhythm.  Normal S1 and S2.  No murmurs or  gallops.  ABDOMEN:  Soft and non-tender.  No rebound or guarding.  Good bowel  sounds.  EXTREMITY EXAM:  No cyanosis, clubbing, or edema.   Twelve lead electrocardiogram:  Atrial fibrillation with rapid  ventricular response.   PROBLEMS:  1. Exertional dyspnea.      a.     Pulmonary hypertension.  Pressure 55/60 mm of mercury.      b.     Abnormal CT scan.  Rule out  fibrosis versus infiltrative       process versus resolving pneumonia versus malignancy.      c.     Ruled out for coronary artery disease (negative cardiac CT       scan in 2006 and negative Cardiolite stress study October 20, 2006       for ischemia).  2. Paroxysmal atrial fibrillation.  3. Status post pneumonia with abnormal CT scan.  See details above.   PLAN:  1. The patient will be admitted for initiation of flecainide therapy.      She is feeling poorly in paroxysmal atrial fibrillation.  2. The patient will placed in the service of Dr. Ronne Binning.  3. Rate control will be obtained with Cardizem.  4. The patient will also get a CT scan done in the hospital for follow      up on her abnormal CT scan findings previously.     Ernestine Mcmurray, MD,FACC  Electronically Signed    GED/MedQ  DD: 11/01/2006  DT: 11/01/2006  Job #: [id number]   cc:   Amanda Pea, MD

## 2011-01-20 ENCOUNTER — Encounter: Payer: Self-pay | Admitting: *Deleted

## 2011-01-20 ENCOUNTER — Encounter: Payer: Self-pay | Admitting: Cardiovascular Disease

## 2011-01-20 ENCOUNTER — Ambulatory Visit (INDEPENDENT_AMBULATORY_CARE_PROVIDER_SITE_OTHER): Payer: Medicare Other | Admitting: Cardiovascular Disease

## 2011-01-20 ENCOUNTER — Telehealth: Payer: Self-pay | Admitting: *Deleted

## 2011-01-20 DIAGNOSIS — I2789 Other specified pulmonary heart diseases: Secondary | ICD-10-CM

## 2011-01-20 DIAGNOSIS — I4891 Unspecified atrial fibrillation: Secondary | ICD-10-CM

## 2011-01-20 DIAGNOSIS — R0602 Shortness of breath: Secondary | ICD-10-CM

## 2011-01-20 DIAGNOSIS — I272 Pulmonary hypertension, unspecified: Secondary | ICD-10-CM

## 2011-01-20 DIAGNOSIS — I050 Rheumatic mitral stenosis: Secondary | ICD-10-CM

## 2011-01-20 DIAGNOSIS — R0600 Dyspnea, unspecified: Secondary | ICD-10-CM

## 2011-01-20 DIAGNOSIS — R0989 Other specified symptoms and signs involving the circulatory and respiratory systems: Secondary | ICD-10-CM

## 2011-01-20 MED ORDER — FUROSEMIDE 20 MG PO TABS: 20.0000 mg | ORAL_TABLET | ORAL | Status: DC | PRN

## 2011-01-20 MED ORDER — NITROGLYCERIN 0.4 MG SL SUBL: 0.4000 mg | SUBLINGUAL_TABLET | SUBLINGUAL | Status: DC | PRN

## 2011-01-20 NOTE — Patient Instructions (Addendum)
Follow up as scheduled. Your physician recommends that you go to the [facility] for lab work/chest x-ray. DO ON June 20 OR 21ST. DO NOT DO BEFORE June 20TH. Your physician has requested that you have a cardiac catheterization. Cardiac catheterization is used to diagnose and/or treat various heart conditions. Doctors may recommend this procedure for a number of different reasons. The most common reason is to evaluate chest pain. Chest pain can be a symptom of coronary artery disease (CAD), and cardiac catheterization can show whether plaque is narrowing or blocking your heart's arteries. This procedure is also used to evaluate the valves, as well as measure the blood flow and oxygen levels in different parts of your heart. For further information please visit [email or web address]. Please follow instruction sheet, as given. Your physician recommends that you continue on your current medications as directed. Please refer to the Current Medication list given to you today.

## 2011-01-20 NOTE — Assessment & Plan Note (Signed)
The patient is having progressive exertional dyspnea recently with minimal activities which is concerning. She has no previous history of coronary artery disease as documented by her previous cardiac catheterization in 2008. Most recent echocardiogram was in November of 2011 which showed normal LV systolic function, moderate mitral stenosis with a mean gradient of 7 mm mercury, moderate to severe pulmonary hypertension  with an estimated pulmonary artery systolic pressure between 55-60 mm mercury. The right ventricle was mildly dilated with mildly reduced systolic function. I am concerned about the possibility of progression of her pulmonary hypertension and also about the etiology. It  seem to be out of proportion to her mitral stenosis. I wonder if there is some degree of primary pulmonary hypertension. In order to evaluate this, I recommend proceeding with a right heart catheterization. Risks, benefits and alternatives were discussed with the patient. Her physical exam, the patient does seem to be slightly fluid overloaded. We'll go ahead and resume Lasix 20 mg once daily.

## 2011-01-20 NOTE — Telephone Encounter (Signed)
(  R) Heart Cath in 2 weeks Pulmonary hypertension, mitral stenosis Dr. Lincoln Brigham Lab Wed, February 02, 2011 Arrive at 1030 for 1130 case

## 2011-01-20 NOTE — Assessment & Plan Note (Signed)
She is currently in normal sinus rhythm but bradycardic which is not unusual for her. Continue treatment with propafenone. She would continue to take a full tablet of metoprolol if needed for breakthrough palpitations. Continue aspirin daily. I think we should consider long-term anticoagulation given that she does have diet-controlled diabetes. That makes her [place] VAS score 3. I would also be concerned due to the presence of mitral stenosis and pulmonary hypertension. Will readdress this after cardiac catheterization.

## 2011-01-20 NOTE — Progress Notes (Signed)
HPI  This is a 65 year old female who is here today for a followup visit. She has history of paroxysmal atrial fibrillation status post cardioversion in the past. Currently maintaining in sinus rhythm on propafenone. She has no history of coronary artery disease per cardiac catheterization in 2008. She also has history of rheumatic fever with moderate mitral stenosis and moderate to severe pulmonary hypertension. Recently the patient had increased episodes of palpitations and was told to increase the dose of metoprolol to 25 mg twice daily if needed. She takes a full dose if she feels that she is in atrial fibrillation but she usually tries to avoid doing that due to her baseline bradycardia. Recently she has been having progressive symptoms of exertional dyspnea and fatigue. This has been going on for about 6 months. She occasionally gets chest pain as well. She feels that she is not able to perform her regular activities as much as before. She is not taking any diuretics at this time.  Allergies  Allergen Reactions  . [id number] Acid+Aspartame     REACTION: GI UPSET  . Levofloxacin   . Pentazocine Lactate   . Procaine Hcl   . Propoxyphene Hcl      Current Outpatient Prescriptions on File Prior to Visit  Medication Sig Dispense Refill  . aspirin 325 MG tablet Take 325 mg by mouth daily.        Marland Kitchen diltiazem (CARDIZEM) 120 MG tablet Take 120 mg by mouth daily.        . metoprolol tartrate (LOPRESSOR) 25 MG tablet TAKE  1/2 TABLET BY MOUTH TWICE DAILY  90 tablet  1  . propafenone (RYTHMOL) 150 MG tablet Take 150 mg by mouth every 8 (eight) hours.        Marland Kitchen DISCONTD: furosemide (LASIX) 20 MG tablet Take 20 mg by mouth as needed.        Marland Kitchen DISCONTD: nitroGLYCERIN (NITROSTAT) 0.4 MG SL tablet Place 0.4 mg under the tongue every 5 (five) minutes as needed.           Past Medical History  Diagnosis Date  . Chest pain     normal coronary angiogram in december 2008  . Mild  aortic regurgitation with left ventricular dilation by prior echocardiogram   . Atherosclerotic cerebrovascular disease     nonobstructive  . Breast mass, right   . Silicone leakage from breast implant   . Dyspnea     chronic exertional dyspnea related to intermittant atrial  . Hypertension     pulmonary   . Carotid bruit     right   . Pneumonia   . Rheumatic fever     as a child  . Atrial fibrillation     post cardioversion maintain normal sinus rhythm  . Breast cancer     remote right breast mastectomy,bilaterial implants  . Breast cancer     radical mastectomy status post billaterial breast implants and right breast implat rupture  . Diabetes mellitus     diet controlled  . Mitral stenosis     moderate     Past Surgical History  Procedure Date  . Appendectomy   . Cholecystectomy   . Total abdominal hysterectomy   . Cardiac catheterization 2008    no significant CAD     History reviewed. No pertinent family history.   History   Social History  . Marital Status: Married    Spouse Name: N/A    Number of Children: N/A  . Years of Education:  N/A   Occupational History  .      lives in [place] History Main Topics  . Smoking status: Former Smoker -- 0.3 packs/day for 10 years    Types: Cigarettes    Quit date: 08/09/1983  . Smokeless tobacco: Never Used  . Alcohol Use: No  . Drug Use: No  . Sexually Active: Not on file   Other Topics Concern  . Not on file   Social History Narrative  . No narrative on file       PHYSICAL EXAM   BP 132/83  Pulse 47  Ht 5\' 8"  (1.727 m)  Wt 224 lb (101.606 kg)  BMI 34.06 kg/m2 Constitutional: She is oriented to person, place, and time. She appears well-developed and well-nourished. No distress.  HENT: No nasal discharge.  Head: Normocephalic and atraumatic.  Eyes: Pupils are equal, round, and reactive to light. Right eye exhibits no discharge. Left eye exhibits no discharge.  Neck: Normal  range of motion. Neck supple. Mild JVD present. No thyromegaly present.  Cardiovascular: Normal rate, regular rhythm, normal heart sounds and intact distal pulses. Exam reveals no gallop and no friction rub.  There is a 2/6 systolic ejection murmur at the aortic and the left sternal border.  Pulmonary/Chest: Effort normal and breath sounds normal. No stridor. No respiratory distress. She has no wheezes. She has no rales. She exhibits no tenderness.  Abdominal: Soft. Bowel sounds are normal. She exhibits no distension. There is no tenderness. There is no rebound and no guarding.  Musculoskeletal: Normal range of motion. She exhibits trace edema and no tenderness.  Neurological: She is alert and oriented to person, place, and time. Coordination normal.  Skin: Skin is warm and dry. No rash noted. She is not diaphoretic. No erythema. No pallor.  Psychiatric: She has a normal mood and affect. Her behavior is normal. Judgment and thought content normal.     EKG: Sinus bradycardia with sinus arrhythmia. Heart rate is 51 beats per minute. No significant ST or T wave changes.   ASSESSMENT AND PLAN

## 2011-01-24 NOTE — Telephone Encounter (Signed)
No precert required 

## 2011-01-24 NOTE — Telephone Encounter (Signed)
It appears so.Marland KitchenMarland KitchenMarland KitchenI wonder if there is some degree of primary pulmonary hypertension. In order to evaluate this, I recommend proceeding with a right heart catheterization

## 2011-01-24 NOTE — Telephone Encounter (Signed)
Is this R Heart Cath checking pressures only?

## 2011-02-01 ENCOUNTER — Telehealth: Payer: Self-pay | Admitting: *Deleted

## 2011-02-01 NOTE — Telephone Encounter (Signed)
Mark in the [place] [facility] lab contacted pt. She notified Elta Guadeloupe that her dgt had called earlier in the week and left a message somewhere that pt wanted to cancel cath. [place]. She will [place] to r/s.

## 2011-02-02 ENCOUNTER — Inpatient Hospital Stay (HOSPITAL_BASED_OUTPATIENT_CLINIC_OR_DEPARTMENT_OTHER): Admission: RE | Admit: 2011-02-02 | Payer: Medicare Other | Source: Ambulatory Visit | Admitting: Cardiovascular Disease

## 2011-02-05 ENCOUNTER — Other Ambulatory Visit: Payer: Self-pay | Admitting: Cardiology

## 2011-03-04 ENCOUNTER — Ambulatory Visit (INDEPENDENT_AMBULATORY_CARE_PROVIDER_SITE_OTHER): Payer: Medicare Other | Admitting: Cardiovascular Disease

## 2011-03-04 ENCOUNTER — Encounter: Payer: Self-pay | Admitting: Cardiovascular Disease

## 2011-03-04 VITALS — BP 104/69 | HR 51 | Ht 68.0 in | Wt 222.5 lb

## 2011-03-04 DIAGNOSIS — I4891 Unspecified atrial fibrillation: Secondary | ICD-10-CM

## 2011-03-04 DIAGNOSIS — I2789 Other specified pulmonary heart diseases: Secondary | ICD-10-CM

## 2011-03-04 DIAGNOSIS — I050 Rheumatic mitral stenosis: Secondary | ICD-10-CM

## 2011-03-04 MED ORDER — PROPAFENONE HCL 150 MG PO TABS: 150.0000 mg | ORAL_TABLET | Freq: Three times a day (TID) | ORAL | Status: DC

## 2011-03-04 MED ORDER — METOPROLOL TARTRATE 25 MG PO TABS: 25.0000 mg | ORAL_TABLET | Freq: Two times a day (BID) | ORAL | Status: DC

## 2011-03-04 MED ORDER — DILTIAZEM HCL 120 MG PO TABS: 120.0000 mg | ORAL_TABLET | Freq: Every day | ORAL | Status: DC

## 2011-03-04 NOTE — Patient Instructions (Signed)
Your physician wants you to follow-up in: 6 months. You will receive a reminder letter in the mail one-two months in advance. If you don't receive a letter, please call our office to schedule the follow-up appointment. Your physician recommends that you continue on your current medications as directed. Please refer to the Current Medication list given to you today. 

## 2011-03-04 NOTE — Assessment & Plan Note (Signed)
She continues to be in sinus rhythm with Propafenone, diltiazem and metoprolol. These will be continued. Her CHADs score is 1 due to diabetes. Continue aspirin daily for now. However, I would have a low threshold to starting anticoagulation due to presence of mitral stenosis as well as pulmonary hypertension.

## 2011-03-04 NOTE — Progress Notes (Signed)
HPI  This is a 65 year old female who is here today for followup visit. She has a history of atrial fibrillation maintained in sinus rhythm with Propafenone. She also has history of moderate mitral stenosis as well as moderate to severe pulmonary hypertension. During her last visit, she complained of increased exertional dyspnea. She did not have any chest pain. Due to her symptoms, I suggested right heart catheterization to see if the pulmonary hypertension is due to mitral stenosis or whether it was due to intrinsic lung disease. The procedure was scheduled. However, the patient called and canceled. She went out of town. I asked her during the last visit to resume Lasix 20 mg daily or as needed.  She denies any chest pain at this time. Her dyspnea slightly better. There is no orthopnea or PND.  Allergies  Allergen Reactions  . Darvon   . [id number] Acid+Aspartame     REACTION: GI UPSET  . Levofloxacin   . Nitrofuran Derivatives     (generic for Macrobid)  . Pentazocine Lactate   . Procaine Hcl   . Propoxyphene Hcl   . Septra (Sulfamethoxazole W/Trimethoprim (Co-Trimoxazole))      Current Outpatient Prescriptions on File Prior to Visit  Medication Sig Dispense Refill  . aspirin 325 MG tablet Take 325 mg by mouth daily.        Marland Kitchen diltiazem (CARDIZEM) 120 MG tablet Take 120 mg by mouth daily.        . furosemide (LASIX) 20 MG tablet Take 1 tablet (20 mg total) by mouth as needed.  30 tablet  6  . metoprolol tartrate (LOPRESSOR) 25 MG tablet TAKE  1/2 TABLET BY MOUTH TWICE DAILY  90 tablet  1  . nitroGLYCERIN (NITROSTAT) 0.4 MG SL tablet Place 1 tablet (0.4 mg total) under the tongue every 5 (five) minutes as needed.  25 tablet  6  . propafenone (RYTHMOL) 150 MG tablet TAKE 1 TABLET EVERY EIGHT HOURS  270 tablet  4     Past Medical History  Diagnosis Date  . Chest pain     normal coronary angiogram in december 2008  . Mild aortic regurgitation with left  ventricular dilation by prior echocardiogram   . Atherosclerotic cerebrovascular disease     nonobstructive  . Breast mass, right   . Silicone leakage from breast implant   . Dyspnea     chronic exertional dyspnea related to intermittant atrial  . Hypertension     pulmonary   . Carotid bruit     right   . Pneumonia   . Rheumatic fever     as a child  . Atrial fibrillation     post cardioversion maintain normal sinus rhythm  . Breast cancer     remote right breast mastectomy,bilaterial implants  . Breast cancer     radical mastectomy status post billaterial breast implants and right breast implat rupture  . Diabetes mellitus     diet controlled  . Mitral stenosis     moderate     Past Surgical History  Procedure Date  . Appendectomy   . Cholecystectomy   . Total abdominal hysterectomy   . Cardiac catheterization 2008    no significant CAD     History reviewed. No pertinent family history.   History   Social History  . Marital Status: Married    Spouse Name: N/A    Number of Children: N/A  . Years of Education: N/A   Occupational History  .  lives in [place] History Main Topics  . Smoking status: Former Smoker -- 0.3 packs/day for 10 years    Types: Cigarettes    Quit date: 08/09/1983  . Smokeless tobacco: Never Used  . Alcohol Use: No  . Drug Use: No  . Sexually Active: Not on file   Other Topics Concern  . Not on file   Social History Narrative  . No narrative on file       PHYSICAL EXAM   BP 104/69  Pulse 51  Ht 5\' 8"  (1.727 m)  Wt 222 lb 8 oz (100.925 kg)  BMI 33.83 kg/m2  Constitutional: She is oriented to person, place, and time. She appears well-developed and well-nourished. No distress.  HENT: No nasal discharge.  Head: Normocephalic and atraumatic.  Eyes: Pupils are equal, round, and reactive to light. Right eye exhibits no discharge. Left eye exhibits no discharge.  Neck: Normal range of motion. Neck  supple. No JVD present. No thyromegaly present.  Cardiovascular: Normal rate, regular rhythm, normal heart sounds. Exam reveals no gallop and no friction rub.  There is a 2/6 systolic ejection murmur at the aortic on the left sternal border. Pulmonary/Chest: Effort normal and breath sounds normal. No stridor. No respiratory distress. She has no wheezes. She has no rales. She exhibits no tenderness.  Abdominal: Soft. Bowel sounds are normal. She exhibits no distension. There is no tenderness. There is no rebound and no guarding.  Musculoskeletal: Normal range of motion. She exhibits trace edema and no tenderness.  Neurological: She is alert and oriented to person, place, and time. Coordination normal.  Skin: Skin is warm and dry. No rash noted. She is not diaphoretic. No erythema. No pallor.  Psychiatric: She has a normal mood and affect. Her behavior is normal. Judgment and thought content normal.      ASSESSMENT AND PLAN

## 2011-03-04 NOTE — Assessment & Plan Note (Addendum)
Her most recent echocardiogram showed evidence of moderate to severe pulmonary hypertension with an estimated pulmonary artery systolic pressure of 123456 mm mercury. This is likely due to diastolic dysfunction from atrial fibrillation as well as mitral stenosis. However, I felt that it might be out of proportion to these 2 etiologies. Thus, I recommended a right heart catheterization to differentiate. The patient was concerned about potential complications with the procedure. She did not keep the appointment. She is currently feeling slightly better than her last visit and thus we'll continue with observation. If her echocardiogram in 6 months shows worsening in pulmonary hypertension or degree of mitral stenosis, then a catheterization should be considered at that time.

## 2011-03-04 NOTE — Assessment & Plan Note (Signed)
This was moderate in most recent echocardiogram with a mean gradient of 7 mm mercury. I recommend a followup echocardiogram in 6 months from now especially due to presence of pulmonary hypertension.

## 2011-04-08 DIAGNOSIS — R079 Chest pain, unspecified: Secondary | ICD-10-CM

## 2011-04-08 DIAGNOSIS — R000 Tachycardia, unspecified: Secondary | ICD-10-CM

## 2011-04-09 HISTORY — PX: CARDIAC CATHETERIZATION: SHX172

## 2011-04-10 DIAGNOSIS — I498 Other specified cardiac arrhythmias: Secondary | ICD-10-CM

## 2011-04-12 ENCOUNTER — Telehealth: Payer: Self-pay | Admitting: Cardiology

## 2011-04-12 NOTE — Telephone Encounter (Signed)
Discussed with patient.  Will have her come for nurse visit in the morning for EKG.  Patient verbalized understanding.

## 2011-04-13 ENCOUNTER — Encounter: Payer: Self-pay | Admitting: *Deleted

## 2011-04-13 ENCOUNTER — Telehealth: Payer: Self-pay | Admitting: *Deleted

## 2011-04-13 ENCOUNTER — Ambulatory Visit (INDEPENDENT_AMBULATORY_CARE_PROVIDER_SITE_OTHER): Payer: Medicare Other | Admitting: *Deleted

## 2011-04-13 VITALS — Ht 68.0 in | Wt 222.0 lb

## 2011-04-13 DIAGNOSIS — I4891 Unspecified atrial fibrillation: Secondary | ICD-10-CM

## 2011-04-13 MED ORDER — METOPROLOL TARTRATE 25 MG PO TABS: 12.5000 mg | ORAL_TABLET | Freq: Two times a day (BID) | ORAL | Status: DC

## 2011-04-13 NOTE — Progress Notes (Signed)
Pt states she was told by Gene and Dr. Fletcher Anon before d/c from [facility] to continue all meds. She states the cardiologist that is there on the weekend and the new doctor from [place] [facility] told her at d/c to stop Diltiazem and take metoprolol 25 mg--1/2 tablet two times a day as needed. She states since d/c she is having headaches and increased heart rates. She states her HR is usually in the 60's at rest but goes up to 110's-120's with any exertion. She states she does not feel good. She was to have an outpt (R/L) cath scheduled next week with Dr. Fletcher Anon. She would like to hold off on scheduling this until she is feeling better. She states she will notify our office if she decides to do cath before Sept 21st office visit. Otherwise, she will discuss further at that visit.   She would like to know if she should resume metoprolol and/or diltiazem.  Pt can be reached at [phone] today.

## 2011-04-13 NOTE — Telephone Encounter (Signed)
Left message to notify pt to resume Lopressor 12.5mg  two times a day.

## 2011-04-13 NOTE — Telephone Encounter (Signed)
Gene Serpe, PA 04/13/2011 12:04 PM Signed  Start Lopressor 12.5 mg bid

## 2011-04-13 NOTE — Progress Notes (Signed)
Start Lopressor 12.5 mg bid

## 2011-04-13 NOTE — Progress Notes (Signed)
Patient came to office for EKG per phone conversation with Taylor Weber regarding heart rate  Being elevated. No c/o dizziness,chest pain or sob. Patient c/o headache since she was taken off her medication after hospital d/c.

## 2011-04-29 ENCOUNTER — Telehealth: Payer: Self-pay | Admitting: *Deleted

## 2011-04-29 ENCOUNTER — Encounter: Payer: Medicare Other | Admitting: Cardiovascular Disease

## 2011-04-29 ENCOUNTER — Encounter: Payer: Self-pay | Admitting: *Deleted

## 2011-04-29 DIAGNOSIS — R079 Chest pain, unspecified: Secondary | ICD-10-CM

## 2011-04-29 DIAGNOSIS — R002 Palpitations: Secondary | ICD-10-CM

## 2011-04-29 DIAGNOSIS — R0602 Shortness of breath: Secondary | ICD-10-CM

## 2011-04-29 NOTE — Telephone Encounter (Signed)
(  L) CATH ON WED IN [facility] W/ARIDA

## 2011-04-29 NOTE — Telephone Encounter (Signed)
Received a call from [facility] that pt needs outpt cath set up per Dr. Tyrell Antonio orders. Candie Mile, nurse at [facility], to have her verify w/pt that she wanted cath scheduled as she has previous cancelled cath several times. Miranda confirmed that pt does want cath scheduled. Miranda was asked to have pt wait about 5 minutes for instruction sheet to be faxed to 2nd floor for her information. Miranda returned call stating pt refused to way stating her husband was waiting in the car and she had to leave. Left message on pt's cell phone to have her come by the office to p/u instruction sheet. Also, instruction sheet w/appt information mailed to the patient.

## 2011-04-30 ENCOUNTER — Telehealth: Payer: Self-pay | Admitting: Physician Assistant

## 2011-04-30 NOTE — Telephone Encounter (Signed)
Pt called because after taking Propafenone 225 developed tachycardia (regular, per pt). Pt became very dizzy, u/a to stand w/out assistance. Weak, SOB. Sx eventually improved. Today, took 150mg  and was concerned. Spoke w/ SK. Called her back. Rec she try 225mg  again this pm, document BP/HR if becomes symptomatic and call back. Pt agreed.

## 2011-05-02 ENCOUNTER — Telehealth: Payer: Self-pay | Admitting: *Deleted

## 2011-05-02 NOTE — Telephone Encounter (Signed)
Pt left message with the office staff that she had questions regarding labs but that she did not want to leave a voicemail message.  Attempted to reach pt but received her voicemail. Left message to notify pt she does not need further lab work before cath. She can call the office with further questions.

## 2011-05-02 NOTE — Telephone Encounter (Signed)
No precert required 

## 2011-05-02 NOTE — Telephone Encounter (Signed)
Addended by: Marcille Buffy on: 05/02/2011 05:07 PM   Modules accepted: Orders

## 2011-05-04 ENCOUNTER — Inpatient Hospital Stay (HOSPITAL_BASED_OUTPATIENT_CLINIC_OR_DEPARTMENT_OTHER)
Admission: RE | Admit: 2011-05-04 | Discharge: 2011-05-04 | Disposition: A | Discharge status: Home / Self Care | Payer: Medicare Other | Source: Ambulatory Visit | Attending: Cardiovascular Disease | Admitting: Cardiovascular Disease

## 2011-05-04 DIAGNOSIS — I2789 Other specified pulmonary heart diseases: Secondary | ICD-10-CM | POA: Insufficient documentation

## 2011-05-04 DIAGNOSIS — R079 Chest pain, unspecified: Secondary | ICD-10-CM

## 2011-05-04 DIAGNOSIS — I4891 Unspecified atrial fibrillation: Secondary | ICD-10-CM | POA: Insufficient documentation

## 2011-05-04 DIAGNOSIS — I059 Rheumatic mitral valve disease, unspecified: Secondary | ICD-10-CM | POA: Insufficient documentation

## 2011-05-04 LAB — POCT I-STAT 3, VENOUS BLOOD GAS (G3P V)
O2 Saturation: 71 %
TCO2: 27 mmol/L (ref 0–100)
pCO2, Ven: 45.3 mmHg (ref 45.0–50.0)
pH, Ven: 7.358 — ABNORMAL HIGH (ref 7.250–7.300)

## 2011-05-04 LAB — POCT I-STAT 3, ART BLOOD GAS (G3+)
Bicarbonate: 23.9 mEq/L (ref 20.0–24.0)
O2 Saturation: 92 %
TCO2: 25 mmol/L (ref 0–100)

## 2011-05-05 ENCOUNTER — Encounter: Payer: Self-pay | Admitting: *Deleted

## 2011-05-13 LAB — GLUCOSE, CAPILLARY: Glucose-Capillary: 110 mg/dL — ABNORMAL HIGH (ref 70–99)

## 2011-05-16 LAB — POCT I-STAT GLUCOSE: Operator id: 141321

## 2011-05-24 ENCOUNTER — Encounter: Payer: Medicare Other | Admitting: Cardiovascular Disease

## 2011-05-24 NOTE — Cardiovascular Report (Signed)
[name], Taylor Weber NO.:  [id number]  MEDICAL RECORD NO.:  [id number]  LOCATION:                                 FACILITY:  PHYSICIAN:  Kathlyn Sacramento, MD     DATE OF BIRTH:  1946-02-17  DATE OF PROCEDURE:  05/04/2011 DATE OF DISCHARGE:                           CARDIAC CATHETERIZATION   PRIMARY CARE PHYSICIAN:  Judd Lien, MD in [place].  PROCEDURES PERFORMED: 1. Right heart catheterization 2. Left heart catheterization 3. Coronary angiography. 4. Left ventricular angiography.  INDICATIONS/CLINICAL HISTORY:  This is a 65 year old female with history of persistent atrial fibrillation, known history of mitral stenosis and pulmonary hypertension noted on echocardiogram.  The patient had recently current presentation with atypical chest pain as well as increased frequency of atrial fibrillation.  The patient is being treated with Rythmol.  She was hospitalized recently and the dose of Rythmol was increased.  Due to recurrent symptoms, I recommended proceeding with a right and left heart catheterization to evaluate the severity of her mitral stenosis as well as pulmonary hypertension. Risks, benefits, and alternatives were discussed with the patient.  STUDY DETAILS:  A standard informed consent was obtained.  The right groin area was prepped in a sterile fashion.  It was anesthetized with 1% lidocaine.  A 7-French sheath was placed in the right femoral vein. A 4-French sheath was placed in the right femoral artery after an anterior puncture.  Right heart catheterization was performed with Gordy Councilman catheter.  Cardiac output was calculated by the Fick method. Simultaneous left ventricular as well as pulmonary wedge pressure recordings were performed.  Mitral valve gradient was measured and the valve area was calculated.  Saturations were obtained from the right femoral artery as well as the right pulmonary artery to calculate the cardiac output.  Left  ventricular angiography was performed with a pigtail catheter.  Coronary angiography was performed with a JL-4 and 3D RCA catheters.  All catheter exchanges were done over the wire.  The patient tolerated the procedure well with no immediate complications.  STUDY FINDINGS:  Hemodynamic findings:  Right atrial pressure is 2 mmHg, right ventricular pressure is 47/0 with an end-diastolic pressure of 5 mmHg, pulmonary capillary wedge pressure is 16 mmHg, PA pressure is 46/16 with a mean of 28 mmHg, left ventricular pressure is 144/7 with left ventricular end-diastolic pressure of 10 mmHg, aortic pressure is 140/64 mmHg, cardiac output is 7 liters per minute with a cardiac index of 3.3 liters per minute per meter squared.  Mitral valve gradient is 11.88 mmHg.  Calculated mitral valve area is 1.83 cm2.  Pulmonary vascular resistance is 2.4 Woods units.  Left ventricular angiography:  This showed normal LV systolic function with an estimated ejection fraction of 55%.  No significant mitral regurgitation is noted.  CORONARY ANGIOGRAPHY: 1. Left main coronary artery:  The vessel is normal in size and free     of significant disease. 2. Left anterior descending artery:  The vessel is normal in size     without significant obstructive disease.  There are minor     irregularities in the mid and distal segment.  First diagonal is  overall small in size and free of significant disease.  Second     diagonal is normal in size and gives 2 medium-sized branches.     Third diagonal is a very small branch.  Old diagonals are free of     significant disease. 3. Left circumflex artery:  The vessel is normal in size and     nondominant.  Overall, it has no significant coronary artery     disease or atherosclerosis. 4. Right coronary artery:  The vessel is normal in size and dominant.     It has a mild plaque formation proximally but no other significant     obstructive disease.  STUDY CONCLUSION: 1.  No significant coronary artery disease. 2. Mild-to-moderate mitral stenosis with a mean gradient of 11.88 mmHg     and a calculated valve area of 1.83 cm2. 3. Moderate pulmonary hypertension with an estimated pulmonary     vascular resistance of 2.4 Woods units. Normal LVEDP.  4. Normal LV systolic function.  RECOMMENDATIONS:  Recommend rhythm control for atrial fibrillation.  No further intervention is needed regarding her mitral stenosis at this time which should be monitored with echocardiogram.  The severity of her pulmonary hypertension is likely not to the point of requiring treatment at this point.     Kathlyn Sacramento, MD     MA/MEDQ  D:  05/04/2011  T:  05/04/2011  Job:  [id number]  cc:   Judd Lien, MD  Electronically Signed by Kathlyn Sacramento MD on 05/24/2011 03:55:50 PM

## 2011-05-25 ENCOUNTER — Encounter: Payer: Self-pay | Admitting: Cardiovascular Disease

## 2011-05-30 ENCOUNTER — Encounter: Payer: Self-pay | Admitting: Cardiovascular Disease

## 2011-05-30 ENCOUNTER — Ambulatory Visit (INDEPENDENT_AMBULATORY_CARE_PROVIDER_SITE_OTHER): Payer: Medicare Other | Admitting: Cardiovascular Disease

## 2011-05-30 VITALS — BP 120/74 | HR 65 | Ht 68.0 in | Wt 218.0 lb

## 2011-05-30 DIAGNOSIS — I050 Rheumatic mitral stenosis: Secondary | ICD-10-CM

## 2011-05-30 DIAGNOSIS — I4891 Unspecified atrial fibrillation: Secondary | ICD-10-CM

## 2011-05-30 MED ORDER — RIVAROXABAN 20 MG PO TABS: 1.0000 | ORAL_TABLET | Freq: Every day | ORAL | Status: DC

## 2011-05-30 NOTE — Patient Instructions (Signed)
Your physician wants you to follow-up in: 3 months. You will receive a reminder letter in the mail one-two months in advance. If you don't receive a letter, please call our office to schedule the follow-up appointment. Stop Aspirin Continue other medications.

## 2011-05-31 ENCOUNTER — Encounter: Payer: Self-pay | Admitting: Cardiovascular Disease

## 2011-05-31 NOTE — Progress Notes (Signed)
HPI  This is a 65 year old female who is here today for followup visit. She has a history of persistent atrial fibrillation maintained in sinus rhythm with Rythmol. She also has history of moderate mitral stenosis and moderate pulmonary hypertension. She had multiple recent admissions at [facility] due to palpitations, dyspnea and chest pain. She also had bradycardia while she was on Rythmol, diltiazem and metoprolol. Her heart rate was noted to go 40 beats per minute. Both metoprolol and diltiazem were stopped and the dose of Rythmol was increased to 225 mg every 8 hours to try to prevent any breakthrough atrial fibrillation. She seems to be very symptomatic when she goes into atrial fibrillation likely due to mitral stenosis. Due to have recurrent symptoms, I proceeded with a right and left cardiac catheterization which showed no evidence of significant coronary artery disease, moderate mitral stenosis with a valve area of 1.83 and a mean gradient of 12 mm mercury. Her pulmonary hypertension was noted to be moderate with a pulmonary vascular resistance of 2.4 Woods units. The plan was to start anticoagulation after cardiac catheterization. Since her catheterization, the patient went to [facility]. While she was there she was hospitalized twice. One time was due to dizziness without documented arrhythmia. The second time was due to brief atrial fibrillation. She converted to normal sinus rhythm. Cardizem 30 mg twice daily was resumed upon discharge. She was also started on anticoagulation with Xarelto. She has not had palpitations since her hospital discharge.  Allergies  Allergen Reactions  . Darvon   . Levofloxacin   . Nitrofuran Derivatives     (generic for Macrobid)  . Novocain   . Pentazocine Lactate   . Procaine Hcl   . Propoxyphene Hcl   . Septra (Sulfamethoxazole W/Trimethoprim (Co-Trimoxazole))      Current Outpatient Prescriptions on File Prior to Visit  Medication Sig Dispense  Refill  . furosemide (LASIX) 20 MG tablet Take 1 tablet (20 mg total) by mouth as needed.  30 tablet  6  . nitroGLYCERIN (NITROSTAT) 0.4 MG SL tablet Place 1 tablet (0.4 mg total) under the tongue every 5 (five) minutes as needed.  25 tablet  6     Past Medical History  Diagnosis Date  . Chest pain     normal coronary angiogram in december 2008  . Mild aortic regurgitation with left ventricular dilation by prior echocardiogram   . Atherosclerotic cerebrovascular disease     nonobstructive  . Breast mass, right   . Silicone leakage from breast implant   . Dyspnea     chronic exertional dyspnea related to intermittant atrial  . Carotid bruit     right   . Pneumonia   . Rheumatic fever     as a child  . Breast cancer     remote right breast mastectomy,bilaterial implants  . Breast cancer     radical mastectomy status post billaterial breast implants and right breast implat rupture  . Diabetes mellitus     diet controlled  . Mitral stenosis     moderate  . Atrial fibrillation     post cardioversion maintain normal sinus rhythm  . 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 Appendectomy   . Cholecystectomy   . Total abdominal hysterectomy   . Cardiac catheterization 2008    no significant CAD  . Cardiac catheterization 04/2011    No significant CAD, moderate  mitral stenosis (mean gradient: 12 mm Hg, MVA: 1.83), moderate pulmonary hypertension (PVR: 2.4 Woods units), Normal LVEDP.      History reviewed. No pertinent family history.   History   Social History  . Marital Status: Married    Spouse Name: N/A    Number of Children: N/A  . Years of Education: N/A   Occupational History  .      lives in [place] History Main Topics  . Smoking status: Former Smoker -- 0.3 packs/day for 10 years    Types: Cigarettes    Quit date: 08/09/1983  . Smokeless tobacco: Never Used  .  Alcohol Use: No  . Drug Use: No  . Sexually Active: Not on file   Other Topics Concern  . Not on file   Social History Narrative  . No narrative on file     PHYSICAL EXAM   BP 120/74  Pulse 65  Ht 5\' 8"  (1.727 m)  Wt 218 lb (98.884 kg)  BMI 33.15 kg/m2  Constitutional: She is oriented to person, place, and time. She appears well-developed and well-nourished. No distress.  HENT: No nasal discharge.  Head: Normocephalic and atraumatic.  Eyes: Pupils are equal, round, and reactive to light. Right eye exhibits no discharge. Left eye exhibits no discharge.  Neck: Normal range of motion. Neck supple. No JVD present. No thyromegaly present.  Cardiovascular: Normal rate, regular rhythm, normal heart sounds and intact distal pulses. Exam reveals no gallop and no friction rub.  No murmur heard.  Pulmonary/Chest: Effort normal and breath sounds normal. No stridor. No respiratory distress. She has no wheezes. She has no rales. She exhibits no tenderness.  Abdominal: Soft. Bowel sounds are normal. She exhibits no distension. There is no tenderness. There is no rebound and no guarding.  Musculoskeletal: Normal range of motion. She exhibits no edema and no tenderness.  Neurological: She is alert and oriented to person, place, and time. Coordination normal.  Skin: Skin is warm and dry. No rash noted. She is not diaphoretic. No erythema. No pallor.  Psychiatric: She has a normal mood and affect. Her behavior is normal. Judgment and thought content normal.     EKG: Normal sinus rhythm.   ASSESSMENT AND PLAN

## 2011-05-31 NOTE — Assessment & Plan Note (Signed)
This was moderate by cardiac catheterization and associated with moderate pulmonary hypertension. Continue to monitor with echocardiograms every 1 to 2 years.

## 2011-05-31 NOTE — Assessment & Plan Note (Signed)
The patient is in normal sinus rhythm at this time. She seems to get very anxious when she goes into atrial fibrillation. This could be either due to anxiety or it is possible that she might be very symptomatic during these episodes due to mitral stenosis and loss of atrial contraction during these episodes. I think it's very important to try to maintain her in normal rhythm. Continue Rythmol 225 mg twice daily. I instructed her to use an extra dose for breakthrough atrial fibrillation. Also continue Cardizem 30 mg twice daily. I instructed her to create a logbook with frequency of breakthrough atrial fibrillation and duration. If these episodes become more frequent and prolonged, the patient will likely need to be evaluated for atrial fibrillation ablation. The other alternative would be a different antiarrhythmic medication. Continue long-term anticoagulation. She is currently on Xarelto but this might be too expensive for her. She is going to check the price of Paradaxa.

## 2011-06-17 LAB — PROTIME-INR

## 2011-06-21 ENCOUNTER — Other Ambulatory Visit: Payer: Self-pay | Admitting: *Deleted

## 2011-06-21 MED ORDER — DILTIAZEM HCL 30 MG PO TABS: 30.0000 mg | ORAL_TABLET | Freq: Two times a day (BID) | ORAL | Status: DC

## 2011-06-21 MED ORDER — RIVAROXABAN 20 MG PO TABS: 1.0000 | ORAL_TABLET | Freq: Every day | ORAL | Status: DC

## 2011-06-21 MED ORDER — PROPAFENONE HCL 225 MG PO TABS: 225.0000 mg | ORAL_TABLET | Freq: Three times a day (TID) | ORAL | Status: DC

## 2011-06-22 ENCOUNTER — Telehealth: Payer: Self-pay | Admitting: *Deleted

## 2011-06-22 NOTE — Telephone Encounter (Signed)
Pharmacist called to verify correct dose of cardizem since last cardizem dose filled was for 120mg  and they received new prescription for cardizem 30 mg bid. Nurse informed pharmacist that based on last ov note 05/30/11, patient dose was cardizem 30 mg bid.

## 2011-08-30 ENCOUNTER — Ambulatory Visit (INDEPENDENT_AMBULATORY_CARE_PROVIDER_SITE_OTHER): Payer: Medicare Other | Admitting: Cardiovascular Disease

## 2011-08-30 ENCOUNTER — Encounter: Payer: Self-pay | Admitting: Cardiovascular Disease

## 2011-08-30 VITALS — BP 144/82 | HR 65 | Ht 68.0 in | Wt 208.0 lb

## 2011-08-30 DIAGNOSIS — I4891 Unspecified atrial fibrillation: Secondary | ICD-10-CM

## 2011-08-30 DIAGNOSIS — I050 Rheumatic mitral stenosis: Secondary | ICD-10-CM | POA: Diagnosis not present

## 2011-08-30 MED ORDER — DILTIAZEM HCL 30 MG PO TABS: 30.0000 mg | ORAL_TABLET | Freq: Two times a day (BID) | ORAL | Status: DC

## 2011-08-30 MED ORDER — PROPAFENONE HCL 225 MG PO TABS: 225.0000 mg | ORAL_TABLET | Freq: Three times a day (TID) | ORAL | Status: DC

## 2011-08-30 MED ORDER — RIVAROXABAN 20 MG PO TABS: 1.0000 | ORAL_TABLET | Freq: Every day | ORAL | Status: DC

## 2011-08-30 NOTE — Assessment & Plan Note (Signed)
She is maintaining in normal sinus rhythm with Rythmol. She had no episodes of symptomatic atrial fibrillation since her last visit. Continue current management. The maximum daily dose of Rythmol is 900 mg per day. She can use an extra dose if needed for breakthrough. She is tolerating anticoagulation.

## 2011-08-30 NOTE — Assessment & Plan Note (Signed)
This was moderate by cardiac catheterization and associated with moderate pulmonary hypertension. Continue to monitor with echocardiograms every 1 to 2 years.

## 2011-08-30 NOTE — Progress Notes (Signed)
HPI  This is a 66 year old female who is here today for followup visit. She has persistent atrial fibrillation currently maintained in sinus rhythm with Rythmol. She is on anticoagulation with Xarelto without any reported side effects. In the past when she was on warfarin she had significant difficulty in maintaining therapeutic INR. Since her last visit, she reports no breakthrough atrial fibrillation. She has occasional mild palpitations. She denies any chest pain or dyspnea.  Allergies  Allergen Reactions  . Darvon   . Levofloxacin   . Nitrofuran Derivatives     (generic for Macrobid)  . Novocain   . Pentazocine Lactate   . Procaine Hcl   . Propoxyphene Hcl   . Septra (Sulfamethoxazole W/Trimethoprim (Co-Trimoxazole))      Current Outpatient Prescriptions on File Prior to Visit  Medication Sig Dispense Refill  . AZOPT 1 % ophthalmic suspension Place 1 drop into both eyes Twice daily.      . furosemide (LASIX) 20 MG tablet Take 1 tablet (20 mg total) by mouth as needed.  30 tablet  6  . nitroGLYCERIN (NITROSTAT) 0.4 MG SL tablet Place 1 tablet (0.4 mg total) under the tongue every 5 (five) minutes as needed.  25 tablet  6     Past Medical History  Diagnosis Date  . Chest pain     normal coronary angiogram in december 2008  . Mild aortic regurgitation with left ventricular dilation by prior echocardiogram   . Atherosclerotic cerebrovascular disease     nonobstructive  . Breast mass, right   . Silicone leakage from breast implant   . Dyspnea     chronic exertional dyspnea related to intermittant atrial  . Carotid bruit     right   . Pneumonia   . Rheumatic fever     as a child  . Breast cancer     remote right breast mastectomy,bilaterial implants  . Breast cancer     radical mastectomy status post billaterial breast implants and right breast implat rupture  . Diabetes mellitus     diet controlled  . Mitral stenosis     moderate  . Pulmonary hypertension due to mitral  valve disease     Moderate  . Atrial fibrillation     post cardioversion maintain normal sinus rhythm     Past Surgical History  Procedure Date  . Appendectomy   . Cholecystectomy   . Total abdominal hysterectomy   . Cardiac catheterization 2008    no significant CAD  . Cardiac catheterization 04/2011    No significant CAD, moderate mitral stenosis (mean gradient: 12 mm Hg, MVA: 1.83), moderate pulmonary hypertension (PVR: 2.4 Woods units), Normal LVEDP.      No family history on file.   History   Social History  . Marital Status: Married    Spouse Name: N/A    Number of Children: N/A  . Years of Education: N/A   Occupational History  .      lives in [place] History Main Topics  . Smoking status: Former Smoker -- 0.3 packs/day for 10 years    Types: Cigarettes    Quit date: 08/09/1983  . Smokeless tobacco: Never Used  . Alcohol Use: No  . Drug Use: No  . Sexually Active: Not on file   Other Topics Concern  . Not on file   Social History Narrative  . No narrative on file     PHYSICAL EXAM   BP 144/82  Pulse 65  Ht 5\' 8"  (1.727 m)  Wt 208 lb (94.348 kg)  BMI 31.63 kg/m2  Constitutional: She is oriented to person, place, and time. She appears well-developed and well-nourished. No distress.  HENT: No nasal discharge.  Head: Normocephalic and atraumatic.  Eyes: Pupils are equal, round, and reactive to light. Right eye exhibits no discharge. Left eye exhibits no discharge.  Neck: Normal range of motion. Neck supple. No JVD present. No thyromegaly present.  Cardiovascular: Normal rate, regular rhythm, normal heart sounds. Exam reveals no gallop and no friction rub.  Is a 2/6 systolic ejection murmur at the left sternal border. Pulmonary/Chest: Effort normal and breath sounds normal. No stridor. No respiratory distress. She has no wheezes. She has no rales. She exhibits no tenderness.  Abdominal: Soft. Bowel sounds are normal. She  exhibits no distension. There is no tenderness. There is no rebound and no guarding.  Musculoskeletal: Normal range of motion. She exhibits no edema and no tenderness.  Neurological: She is alert and oriented to person, place, and time. Coordination normal.  Skin: Skin is warm and dry. No rash noted. She is not diaphoretic. No erythema. No pallor.  Psychiatric: She has a normal mood and affect. Her behavior is normal. Judgment and thought content normal.     EKG: Normal sinus rhythm with sinus arrhythmia. Heart rate is 56 beats per minute. Normal QT interval. No significant ST or T wave changes.   ASSESSMENT AND PLAN

## 2011-08-30 NOTE — Patient Instructions (Signed)
Your physician wants you to follow-up in: 6 months. You will receive a reminder letter in the mail one-two months in advance. If you don't receive a letter, please call our office to schedule the follow-up appointment. Your physician recommends that you continue on your current medications as directed. Please refer to the Current Medication list given to you today. 

## 2011-08-31 ENCOUNTER — Encounter: Payer: Self-pay | Admitting: Cardiovascular Disease

## 2011-09-15 DIAGNOSIS — R1084 Generalized abdominal pain: Secondary | ICD-10-CM | POA: Diagnosis not present

## 2011-09-15 DIAGNOSIS — R1011 Right upper quadrant pain: Secondary | ICD-10-CM | POA: Diagnosis not present

## 2011-09-22 DIAGNOSIS — Z9089 Acquired absence of other organs: Secondary | ICD-10-CM | POA: Diagnosis not present

## 2011-09-22 DIAGNOSIS — N281 Cyst of kidney, acquired: Secondary | ICD-10-CM | POA: Diagnosis not present

## 2011-09-22 DIAGNOSIS — Q6101 Congenital single renal cyst: Secondary | ICD-10-CM | POA: Diagnosis not present

## 2011-09-22 DIAGNOSIS — R1011 Right upper quadrant pain: Secondary | ICD-10-CM | POA: Diagnosis not present

## 2011-11-02 DIAGNOSIS — K6289 Other specified diseases of anus and rectum: Secondary | ICD-10-CM | POA: Diagnosis not present

## 2011-11-02 DIAGNOSIS — K649 Unspecified hemorrhoids: Secondary | ICD-10-CM | POA: Diagnosis not present

## 2011-11-10 DIAGNOSIS — R10819 Abdominal tenderness, unspecified site: Secondary | ICD-10-CM | POA: Diagnosis not present

## 2011-11-10 DIAGNOSIS — K6289 Other specified diseases of anus and rectum: Secondary | ICD-10-CM | POA: Diagnosis not present

## 2011-11-11 DIAGNOSIS — Z0189 Encounter for other specified special examinations: Secondary | ICD-10-CM | POA: Diagnosis not present

## 2011-11-16 DIAGNOSIS — R109 Unspecified abdominal pain: Secondary | ICD-10-CM | POA: Diagnosis not present

## 2011-11-16 DIAGNOSIS — K6289 Other specified diseases of anus and rectum: Secondary | ICD-10-CM | POA: Diagnosis not present

## 2011-11-16 DIAGNOSIS — N949 Unspecified condition associated with female genital organs and menstrual cycle: Secondary | ICD-10-CM | POA: Diagnosis not present

## 2011-11-16 DIAGNOSIS — N281 Cyst of kidney, acquired: Secondary | ICD-10-CM | POA: Diagnosis not present

## 2011-11-16 DIAGNOSIS — Z9089 Acquired absence of other organs: Secondary | ICD-10-CM | POA: Diagnosis not present

## 2011-11-16 DIAGNOSIS — Z90710 Acquired absence of both cervix and uterus: Secondary | ICD-10-CM | POA: Diagnosis not present

## 2011-11-22 DIAGNOSIS — Z09 Encounter for follow-up examination after completed treatment for conditions other than malignant neoplasm: Secondary | ICD-10-CM | POA: Diagnosis not present

## 2011-11-22 DIAGNOSIS — Z79899 Other long term (current) drug therapy: Secondary | ICD-10-CM | POA: Diagnosis not present

## 2011-11-22 DIAGNOSIS — Z9010 Acquired absence of unspecified breast and nipple: Secondary | ICD-10-CM | POA: Diagnosis not present

## 2011-11-22 DIAGNOSIS — K6289 Other specified diseases of anus and rectum: Secondary | ICD-10-CM | POA: Diagnosis not present

## 2011-11-22 DIAGNOSIS — Z853 Personal history of malignant neoplasm of breast: Secondary | ICD-10-CM | POA: Diagnosis not present

## 2011-11-23 DIAGNOSIS — K6289 Other specified diseases of anus and rectum: Secondary | ICD-10-CM | POA: Diagnosis not present

## 2011-11-23 DIAGNOSIS — R935 Abnormal findings on diagnostic imaging of other abdominal regions, including retroperitoneum: Secondary | ICD-10-CM | POA: Diagnosis not present

## 2011-11-30 DIAGNOSIS — R935 Abnormal findings on diagnostic imaging of other abdominal regions, including retroperitoneum: Secondary | ICD-10-CM | POA: Diagnosis not present

## 2011-11-30 DIAGNOSIS — Z853 Personal history of malignant neoplasm of breast: Secondary | ICD-10-CM | POA: Diagnosis not present

## 2011-11-30 DIAGNOSIS — I4891 Unspecified atrial fibrillation: Secondary | ICD-10-CM | POA: Diagnosis not present

## 2011-11-30 DIAGNOSIS — Z79899 Other long term (current) drug therapy: Secondary | ICD-10-CM | POA: Diagnosis not present

## 2011-11-30 DIAGNOSIS — H409 Unspecified glaucoma: Secondary | ICD-10-CM | POA: Diagnosis not present

## 2011-11-30 DIAGNOSIS — K6289 Other specified diseases of anus and rectum: Secondary | ICD-10-CM | POA: Diagnosis not present

## 2011-11-30 DIAGNOSIS — Z87891 Personal history of nicotine dependence: Secondary | ICD-10-CM | POA: Diagnosis not present

## 2011-11-30 DIAGNOSIS — Z86010 Personal history of colonic polyps: Secondary | ICD-10-CM | POA: Diagnosis not present

## 2012-01-04 DIAGNOSIS — Z9010 Acquired absence of unspecified breast and nipple: Secondary | ICD-10-CM | POA: Diagnosis not present

## 2012-01-04 DIAGNOSIS — Z1231 Encounter for screening mammogram for malignant neoplasm of breast: Secondary | ICD-10-CM | POA: Diagnosis not present

## 2012-01-23 DIAGNOSIS — F411 Generalized anxiety disorder: Secondary | ICD-10-CM | POA: Diagnosis not present

## 2012-01-23 DIAGNOSIS — R7301 Impaired fasting glucose: Secondary | ICD-10-CM | POA: Diagnosis not present

## 2012-01-23 DIAGNOSIS — L299 Pruritus, unspecified: Secondary | ICD-10-CM | POA: Diagnosis not present

## 2012-02-22 DIAGNOSIS — Z8049 Family history of malignant neoplasm of other genital organs: Secondary | ICD-10-CM | POA: Diagnosis not present

## 2012-02-22 DIAGNOSIS — L820 Inflamed seborrheic keratosis: Secondary | ICD-10-CM | POA: Diagnosis not present

## 2012-02-22 DIAGNOSIS — R0789 Other chest pain: Secondary | ICD-10-CM | POA: Diagnosis not present

## 2012-02-22 DIAGNOSIS — Z884 Allergy status to anesthetic agent status: Secondary | ICD-10-CM | POA: Diagnosis not present

## 2012-02-22 DIAGNOSIS — Z8249 Family history of ischemic heart disease and other diseases of the circulatory system: Secondary | ICD-10-CM | POA: Diagnosis not present

## 2012-02-22 DIAGNOSIS — Z883 Allergy status to other anti-infective agents status: Secondary | ICD-10-CM | POA: Diagnosis not present

## 2012-02-22 DIAGNOSIS — I2789 Other specified pulmonary heart diseases: Secondary | ICD-10-CM | POA: Diagnosis not present

## 2012-02-22 DIAGNOSIS — D239 Other benign neoplasm of skin, unspecified: Secondary | ICD-10-CM | POA: Diagnosis not present

## 2012-02-22 DIAGNOSIS — R079 Chest pain, unspecified: Secondary | ICD-10-CM | POA: Diagnosis not present

## 2012-02-22 DIAGNOSIS — Z7901 Long term (current) use of anticoagulants: Secondary | ICD-10-CM | POA: Diagnosis not present

## 2012-02-22 DIAGNOSIS — Z885 Allergy status to narcotic agent status: Secondary | ICD-10-CM | POA: Diagnosis not present

## 2012-02-22 DIAGNOSIS — H409 Unspecified glaucoma: Secondary | ICD-10-CM | POA: Diagnosis not present

## 2012-02-22 DIAGNOSIS — Z886 Allergy status to analgesic agent status: Secondary | ICD-10-CM | POA: Diagnosis not present

## 2012-02-22 DIAGNOSIS — I4891 Unspecified atrial fibrillation: Secondary | ICD-10-CM | POA: Diagnosis not present

## 2012-02-22 DIAGNOSIS — Z79899 Other long term (current) drug therapy: Secondary | ICD-10-CM | POA: Diagnosis not present

## 2012-02-22 DIAGNOSIS — L659 Nonscarring hair loss, unspecified: Secondary | ICD-10-CM | POA: Diagnosis not present

## 2012-02-22 DIAGNOSIS — I050 Rheumatic mitral stenosis: Secondary | ICD-10-CM | POA: Diagnosis not present

## 2012-02-22 DIAGNOSIS — Z8042 Family history of malignant neoplasm of prostate: Secondary | ICD-10-CM | POA: Diagnosis not present

## 2012-02-23 DIAGNOSIS — R079 Chest pain, unspecified: Secondary | ICD-10-CM | POA: Diagnosis not present

## 2012-02-23 DIAGNOSIS — I4891 Unspecified atrial fibrillation: Secondary | ICD-10-CM

## 2012-02-24 DIAGNOSIS — Z79899 Other long term (current) drug therapy: Secondary | ICD-10-CM | POA: Diagnosis not present

## 2012-02-24 DIAGNOSIS — I4891 Unspecified atrial fibrillation: Secondary | ICD-10-CM | POA: Diagnosis not present

## 2012-02-24 DIAGNOSIS — I050 Rheumatic mitral stenosis: Secondary | ICD-10-CM | POA: Diagnosis not present

## 2012-02-24 DIAGNOSIS — Z7901 Long term (current) use of anticoagulants: Secondary | ICD-10-CM | POA: Diagnosis not present

## 2012-02-24 DIAGNOSIS — I2789 Other specified pulmonary heart diseases: Secondary | ICD-10-CM | POA: Diagnosis not present

## 2012-02-24 DIAGNOSIS — R079 Chest pain, unspecified: Secondary | ICD-10-CM | POA: Diagnosis not present

## 2012-02-24 DIAGNOSIS — R0789 Other chest pain: Secondary | ICD-10-CM | POA: Diagnosis not present

## 2012-02-24 DIAGNOSIS — H409 Unspecified glaucoma: Secondary | ICD-10-CM | POA: Diagnosis not present

## 2012-02-24 DIAGNOSIS — Z8249 Family history of ischemic heart disease and other diseases of the circulatory system: Secondary | ICD-10-CM | POA: Diagnosis not present

## 2012-02-27 ENCOUNTER — Telehealth: Payer: Self-pay | Admitting: Physician Assistant

## 2012-02-27 NOTE — Telephone Encounter (Signed)
Patient called. This evening she feels nauseated and HR is around 43. She was recently discharged from [facility] with rx for rhythmol 300mg  TID and diltiazem 120mg  daily (in form of 30mg  tablets). She just feels bad. No syncope but doesn't feel right. I advised she proceed to ER for concern for bradycardia but she does not want to go. She is in sound state of mind, A&Ox3. She reports bad prior experiences in the ER. I discussed my concern for AV block or worsening bradycardia given that she is symptomatic, but she elected to stay home and monitor symptoms. I advise she skip tonight's propafenone dose. She does not have any further diltiazem doses tonight. I advised her to call office in the AM with an update on how she is feeling including HR, to possibly request an appt to be seen or to discuss med changes. She verbalized understanding and states she will do so. She also said she will proceed to ER immediately if she begins to feel worse. Danea Manter PA-C

## 2012-02-28 ENCOUNTER — Ambulatory Visit: Payer: Medicare Other | Admitting: Cardiology

## 2012-02-28 NOTE — Telephone Encounter (Signed)
Patient informed after walking into office and verbalized understanding of plan.

## 2012-02-28 NOTE — Telephone Encounter (Signed)
Left message for patient to call office.  

## 2012-02-28 NOTE — Telephone Encounter (Signed)
Please call the patient and check on her. Ask her to stop Cardizem for now and continue Rythmol 300 mg q8 hrs. Continue to monitor HR. If it remains less than 50, we might have to decrease Rythmol dose. ----- Message ----- From: Charlie Pitter, PA Sent: 02/27/2012 5:59 PM To: Wellington Hampshire, MD

## 2012-02-28 NOTE — Telephone Encounter (Signed)
Message left on nurse's voicemail that patient was confused about what nurse informed her today and just needed clarification. Nurse called number provided back and left message on daughter's voicemail that call was returned.

## 2012-02-28 NOTE — Telephone Encounter (Signed)
Daughter informed

## 2012-03-12 ENCOUNTER — Ambulatory Visit (INDEPENDENT_AMBULATORY_CARE_PROVIDER_SITE_OTHER): Payer: Medicare Other | Admitting: Physician Assistant

## 2012-03-12 VITALS — BP 110/60 | HR 63 | Ht 68.0 in | Wt 213.5 lb

## 2012-03-12 DIAGNOSIS — I4891 Unspecified atrial fibrillation: Secondary | ICD-10-CM | POA: Diagnosis not present

## 2012-03-12 DIAGNOSIS — I050 Rheumatic mitral stenosis: Secondary | ICD-10-CM | POA: Diagnosis not present

## 2012-03-12 MED ORDER — NITROGLYCERIN 0.4 MG SL SUBL: 0.4000 mg | SUBLINGUAL_TABLET | SUBLINGUAL | Status: DC | PRN

## 2012-03-12 MED ORDER — FUROSEMIDE 20 MG PO TABS: 20.0000 mg | ORAL_TABLET | ORAL | Status: DC | PRN

## 2012-03-12 MED ORDER — PROPAFENONE HCL 150 MG PO TABS: 75.0000 mg | ORAL_TABLET | Freq: Three times a day (TID) | ORAL | Status: DC

## 2012-03-12 NOTE — Assessment & Plan Note (Signed)
Maintaining NSR on Rythmol. Diltiazem recently discontinued, secondary to symptomatic bradycardia. Patient continues to prefer Xarelto anticoagulation, having been on Coumadin in the past.

## 2012-03-12 NOTE — Progress Notes (Signed)
Primary Cardiologist: Jacqulyn Cane, MD   HPI: Post hospital followup from [facility], following recent presentation with PAF RVR. Medications adjusted with increase in Rythmol to 3 times a day. Patient was already on Xarelto. Subsequent successful DCCV (x1), by Dr Fletcher Anon. TSH level normal.  She was subsequently taken off diltiazem 120 daily, per Dr. Tyrell Antonio recommendation, following complaint of symptomatic bradycardia. She reported pulse readings in the low 40s, with associated nausea. Since then, she has noted pulse readings in the 50s, and no further nausea.  Patient is keenly aware of when she is out of NSR. She denies any recurrent tachycardia palpitations.  Allergies  Allergen Reactions  . Darvon   . Levofloxacin   . Nitrofuran Derivatives     (generic for Macrobid)  . Pentazocine Lactate   . Procaine Hcl   . Procaine Hcl   . Propoxyphene Hcl   . Septra (Sulfamethoxazole W/Trimethoprim (Co-Trimoxazole))     Current Outpatient Prescriptions  Medication Sig Dispense Refill  . AZOPT 1 % ophthalmic suspension Place 1 drop into both eyes Twice daily.      . propafenone (RYTHMOL) 150 MG tablet Take 0.5 tablets (75 mg total) by mouth every 8 (eight) hours. Take with your 225mg  to equal a total q8 hour dose of 300mg   90 tablet  3  . propafenone (RYTHMOL) 225 MG tablet Take 1 tablet (225 mg total) by mouth every 8 (eight) hours.  270 tablet  3  . Rivaroxaban (XARELTO) 20 MG TABS Take 1 tablet by mouth daily.  90 tablet  3  . DISCONTD: furosemide (LASIX) 20 MG tablet Take 1 tablet (20 mg total) by mouth as needed.  30 tablet  6  . DISCONTD: nitroGLYCERIN (NITROSTAT) 0.4 MG SL tablet Place 1 tablet (0.4 mg total) under the tongue every 5 (five) minutes as needed.  25 tablet  6  . DISCONTD: propafenone (RYTHMOL) 150 MG tablet Take 75 mg by mouth every 8 (eight) hours. Take with your 225mg  to equal a total q8 hour dose of 300mg       . furosemide (LASIX) 20 MG tablet Take 1 tablet (20 mg total) by  mouth as needed.  30 tablet  6  . nitroGLYCERIN (NITROSTAT) 0.4 MG SL tablet Place 1 tablet (0.4 mg total) under the tongue every 5 (five) minutes as needed.  25 tablet  6    Past Medical History  Diagnosis Date  . Chest pain     normal coronary angiogram in december 2008  . Mild aortic regurgitation with left ventricular dilation by prior echocardiogram   . Atherosclerotic cerebrovascular disease     nonobstructive  . Breast mass, right   . Silicone leakage from breast implant   . Dyspnea     chronic exertional dyspnea related to intermittant atrial  . Carotid bruit     right   . Pneumonia   . Rheumatic fever     as a child  . Breast cancer     remote right breast mastectomy,bilaterial implants  . Breast cancer     radical mastectomy status post billaterial breast implants and right breast implat rupture  . Diabetes mellitus     diet controlled  . Mitral stenosis     moderate  . 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 Atrial fibrillation     post cardioversion maintain normal sinus rhythm    Past Surgical History  Procedure Date  . Appendectomy   . Cholecystectomy   .  Total abdominal hysterectomy   . Cardiac catheterization 2008    no significant CAD  . Cardiac catheterization 04/2011    No significant CAD, moderate mitral stenosis (mean gradient: 12 mm Hg, MVA: 1.83), moderate pulmonary hypertension (PVR: 2.4 Woods units), Normal LVEDP.     History   Social History  . Marital Status: Married    Spouse Name: N/A    Number of Children: N/A  . Years of Education: N/A   Occupational History  .      lives in [place] History Main Topics  . Smoking status: Former Smoker -- 0.3 packs/day for 10 years    Types: Cigarettes    Quit date: 08/09/1983  . Smokeless tobacco: Never Used  . Alcohol Use: No  . Drug Use: No  . Sexually Active: Not on file   Other Topics Concern  . Not on file   Social History Narrative    . No narrative on file    No family history on file.  ROS: no nausea, vomiting; no fever, chills; no melena, hematochezia; no claudication  PHYSICAL EXAM: BP 110/60  Pulse 63  Ht 5\' 8"  (1.727 m)  Wt 213 lb 8 oz (96.843 kg)  BMI 32.46 kg/m2 GENERAL: 66 year old female, moderately obese; NAD HEENT: NCAT, PERRLA, EOMI; sclera clear; no xanthelasma NECK: palpable bilateral carotid pulses, no bruits; no JVD; no TM LUNGS: CTA bilaterally CARDIAC: RRR (S1, S2); soft 2/6 systolic ejection murmur at the base; no rubs or gallops ABDOMEN: soft, non-tender; intact BS EXTREMETIES: intact distal pulses; no significant peripheral edema SKIN: warm/dry; no obvious rash/lesions MUSCULOSKELETAL: no joint deformity NEURO: no focal deficit; NL affect   EKG: reviewed and available in Electronic Records   ASSESSMENT & PLAN:  ATRIAL FIBRILLATION Maintaining NSR on Rythmol. Diltiazem recently discontinued, secondary to symptomatic bradycardia. Patient continues to prefer Xarelto anticoagulation, having been on Coumadin in the past.  Mitral stenosis Clinically stable, continue to monitor closely. Previously assessed as mild/moderate; normal LVF, by cardiac catheterization, 9/12.    Gene Mar Zettler, PAC

## 2012-03-12 NOTE — Assessment & Plan Note (Addendum)
Clinically stable, continue to monitor closely. Previously assessed as mild/moderate; normal LVF, by cardiac catheterization, 9/12.

## 2012-03-12 NOTE — Patient Instructions (Signed)
Continue all current medications. Follow up in  4 months  

## 2012-03-12 NOTE — Addendum Note (Signed)
Addended by: Norberta Keens on: 03/12/2012 04:21 PM   Modules accepted: Orders

## 2012-04-16 ENCOUNTER — Other Ambulatory Visit: Payer: Self-pay | Admitting: Family Medicine

## 2012-04-16 DIAGNOSIS — Z853 Personal history of malignant neoplasm of breast: Secondary | ICD-10-CM

## 2012-04-16 DIAGNOSIS — N63 Unspecified lump in unspecified breast: Secondary | ICD-10-CM | POA: Diagnosis not present

## 2012-04-16 DIAGNOSIS — N6320 Unspecified lump in the left breast, unspecified quadrant: Secondary | ICD-10-CM

## 2012-04-17 ENCOUNTER — Other Ambulatory Visit: Payer: Medicare Other

## 2012-04-20 ENCOUNTER — Ambulatory Visit
Admission: RE | Admit: 2012-04-20 | Discharge: 2012-04-20 | Disposition: A | Discharge status: Home / Self Care | Payer: Medicare Other | Source: Ambulatory Visit | Attending: Family Medicine | Admitting: Family Medicine

## 2012-04-20 DIAGNOSIS — Z853 Personal history of malignant neoplasm of breast: Secondary | ICD-10-CM

## 2012-04-20 DIAGNOSIS — N6320 Unspecified lump in the left breast, unspecified quadrant: Secondary | ICD-10-CM

## 2012-04-20 DIAGNOSIS — N63 Unspecified lump in unspecified breast: Secondary | ICD-10-CM | POA: Diagnosis not present

## 2012-05-31 ENCOUNTER — Telehealth: Payer: Self-pay | Admitting: Cardiovascular Disease

## 2012-05-31 NOTE — Telephone Encounter (Signed)
Informed pt there are not food and drug interactions with Xarelto like there are with warfarin.  She is OK to take prescribed meds.

## 2012-05-31 NOTE — Telephone Encounter (Signed)
Oral surgeon prescribe her presnisone 5mg  tablet as directed and AMox-Clav 500mg  1 tablet by mouth 3x dailey. Patient is concerned with side affect of amox-clav and Taylor Weber

## 2012-06-18 DIAGNOSIS — J209 Acute bronchitis, unspecified: Secondary | ICD-10-CM | POA: Diagnosis not present

## 2012-06-18 DIAGNOSIS — J029 Acute pharyngitis, unspecified: Secondary | ICD-10-CM | POA: Diagnosis not present

## 2012-07-16 ENCOUNTER — Ambulatory Visit: Payer: Medicare Other | Admitting: Cardiology

## 2012-07-16 DIAGNOSIS — Z23 Encounter for immunization: Secondary | ICD-10-CM | POA: Diagnosis not present

## 2012-07-26 ENCOUNTER — Encounter: Payer: Self-pay | Admitting: Cardiovascular Disease

## 2012-07-26 ENCOUNTER — Ambulatory Visit (INDEPENDENT_AMBULATORY_CARE_PROVIDER_SITE_OTHER): Payer: Medicare Other | Admitting: Cardiovascular Disease

## 2012-07-26 VITALS — BP 131/84 | HR 56 | Ht 68.5 in | Wt 212.8 lb

## 2012-07-26 DIAGNOSIS — I4891 Unspecified atrial fibrillation: Secondary | ICD-10-CM | POA: Diagnosis not present

## 2012-07-26 DIAGNOSIS — I050 Rheumatic mitral stenosis: Secondary | ICD-10-CM

## 2012-07-26 MED ORDER — PROPAFENONE HCL 300 MG PO TABS: 300.0000 mg | ORAL_TABLET | Freq: Three times a day (TID) | ORAL | Status: DC

## 2012-07-26 MED ORDER — RIVAROXABAN 20 MG PO TABS: 20.0000 mg | ORAL_TABLET | Freq: Every day | ORAL | Status: DC

## 2012-07-26 NOTE — Patient Instructions (Addendum)
Continue same medications.  Schedule echo in 6 months.  Follow up 1 week after echo .

## 2012-07-29 ENCOUNTER — Encounter: Payer: Self-pay | Admitting: Cardiovascular Disease

## 2012-07-29 NOTE — Assessment & Plan Note (Signed)
She has not had any further episodes of atrial fibrillation since the dose of Rythmol was increased to 300 mg 3 times daily. This will be continued at the current dose. The other issue I discussed with her extensively today is the issue of anticoagulation. The patient has been on Xarelto without any side effects or complications. This was initiated when she had an episode of atrial fibrillation while she was on vacation in [place]. I discussed with her that these kind of medications are approved for non-valvular atrial fibrillation. However, in her situation the atrial fibrillation is likely due to mitral stenosis. Thus, technically she should be on warfarin. The patient is very hesitant to go back on warfarin due to difficulty maintaining her INR in the past. I will obtain an echocardiogram in 6 months and reevaluate her valvular disease. I think at that time we should seriously consider switching her to warfarin.

## 2012-07-29 NOTE — Progress Notes (Signed)
HPI  This is a 66 year old female who is here today for followup visit. She has persistent atrial fibrillation currently maintained in sinus rhythm with Rythmol. She is on anticoagulation with Xarelto without any reported side effects. In the past when she was on warfarin she had significant difficulty in maintaining therapeutic INR. She also has moderate mitral stenosis with moderate pulmonary hypertension. No coronary artery disease on previous cardiac catheterization. She was hospitalized at [facility] in August of this year with atrial fibrillation with rapid ventricular response. She underwent successful DCCV. The dose of Rythmol was increased at that time to 300 mg 3 times daily. Diltiazem was subsequently stopped due to bradycardia. No episodes of atrial fibrillation since then. She is doing very well.  Allergies  Allergen Reactions  . Darvon   . Levofloxacin   . Nitrofuran Derivatives     (generic for Macrobid)  . Pentazocine Lactate   . Procaine Hcl   . Procaine Hcl   . Propoxyphene Hcl   . Septra (Sulfamethoxazole W/Trimethoprim (Co-Trimoxazole))      Current Outpatient Prescriptions on File Prior to Visit  Medication Sig Dispense Refill  . AZOPT 1 % ophthalmic suspension Place 1 drop into both eyes Twice daily.      . furosemide (LASIX) 20 MG tablet Take 1 tablet (20 mg total) by mouth as needed.  30 tablet  6  . nitroGLYCERIN (NITROSTAT) 0.4 MG SL tablet Place 1 tablet (0.4 mg total) under the tongue every 5 (five) minutes as needed.  25 tablet  6  . Rivaroxaban (XARELTO) 20 MG TABS Take 1 tablet (20 mg total) by mouth daily.  90 tablet  1  . propafenone (RYTHMOL) 300 MG tablet Take 1 tablet (300 mg total) by mouth every 8 (eight) hours.  90 tablet  3     Past Medical History  Diagnosis Date  . Chest pain     normal coronary angiogram in december 2008  . Mild aortic regurgitation with left ventricular dilation by prior echocardiogram   . Atherosclerotic  cerebrovascular disease     nonobstructive  . Breast mass, right   . Silicone leakage from breast implant   . Dyspnea     chronic exertional dyspnea related to intermittant atrial  . Carotid bruit     right   . Pneumonia   . Rheumatic fever     as a child  . Breast cancer     remote right breast mastectomy,bilaterial implants  . Breast cancer     radical mastectomy status post billaterial breast implants and right breast implat rupture  . Diabetes mellitus     diet controlled  . Mitral stenosis     moderate  . 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 Atrial fibrillation     post cardioversion maintain normal sinus rhythm     Past Surgical History  Procedure Date  . Appendectomy   . Cholecystectomy   . Total abdominal hysterectomy   . Cardiac catheterization 2008    no significant CAD  . Cardiac catheterization 04/2011    No significant CAD, moderate mitral stenosis (mean gradient: 12 mm Hg, MVA: 1.83), moderate pulmonary hypertension (PVR: 2.4 Woods units), Normal LVEDP.      No family history on file.   History   Social History  . Marital Status: Married    Spouse Name: N/A    Number of Children: N/A  . Years of Education: N/A  Occupational History  .      lives in [place] History Main Topics  . Smoking status: Former Smoker -- 0.3 packs/day for 10 years    Types: Cigarettes    Quit date: 08/09/1983  . Smokeless tobacco: Never Used  . Alcohol Use: No  . Drug Use: No  . Sexually Active: Not on file   Other Topics Concern  . Not on file   Social History Narrative  . No narrative on file     PHYSICAL EXAM   BP 131/84  Pulse 56  Ht 5' 8.5" (1.74 m)  Wt 212 lb 12.8 oz (96.525 kg)  BMI 31.89 kg/m2  SpO2 98%  Constitutional: She is oriented to person, place, and time. She appears well-developed and well-nourished. No distress.  HENT: No nasal discharge.  Head: Normocephalic and atraumatic.    Eyes: Pupils are equal, round, and reactive to light. Right eye exhibits no discharge. Left eye exhibits no discharge.  Neck: Normal range of motion. Neck supple. No JVD present. No thyromegaly present.  Cardiovascular: Normal rate, regular rhythm, normal heart sounds. Exam reveals no gallop and no friction rub.  Is a 2/6 systolic ejection murmur at the left sternal border. Pulmonary/Chest: Effort normal and breath sounds normal. No stridor. No respiratory distress. She has no wheezes. She has no rales. She exhibits no tenderness.  Abdominal: Soft. Bowel sounds are normal. She exhibits no distension. There is no tenderness. There is no rebound and no guarding.  Musculoskeletal: Normal range of motion. She exhibits no edema and no tenderness.  Neurological: She is alert and oriented to person, place, and time. Coordination normal.  Skin: Skin is warm and dry. No rash noted. She is not diaphoretic. No erythema. No pallor.  Psychiatric: She has a normal mood and affect. Her behavior is normal. Judgment and thought content normal.     EKG: Normal sinus rhythm with sinus arrhythmia. Heart rate is 59 beats per minute. Normal QT interval. No significant ST or T wave changes.   ASSESSMENT AND PLAN

## 2012-07-29 NOTE — Assessment & Plan Note (Signed)
I recommend a followup echocardiogram in 6 months.

## 2012-08-06 DIAGNOSIS — J019 Acute sinusitis, unspecified: Secondary | ICD-10-CM | POA: Diagnosis not present

## 2012-08-06 DIAGNOSIS — K089 Disorder of teeth and supporting structures, unspecified: Secondary | ICD-10-CM | POA: Diagnosis not present

## 2012-08-30 DIAGNOSIS — R350 Frequency of micturition: Secondary | ICD-10-CM | POA: Diagnosis not present

## 2012-08-30 DIAGNOSIS — R300 Dysuria: Secondary | ICD-10-CM | POA: Diagnosis not present

## 2012-08-30 DIAGNOSIS — F411 Generalized anxiety disorder: Secondary | ICD-10-CM | POA: Diagnosis not present

## 2012-08-31 DIAGNOSIS — E119 Type 2 diabetes mellitus without complications: Secondary | ICD-10-CM | POA: Diagnosis not present

## 2012-09-13 ENCOUNTER — Other Ambulatory Visit: Payer: Self-pay | Admitting: *Deleted

## 2012-09-13 MED ORDER — RIVAROXABAN 20 MG PO TABS: 20.0000 mg | ORAL_TABLET | Freq: Every day | ORAL | Status: DC

## 2012-09-13 MED ORDER — PROPAFENONE HCL 300 MG PO TABS: 300.0000 mg | ORAL_TABLET | Freq: Three times a day (TID) | ORAL | Status: DC

## 2012-09-16 DIAGNOSIS — Z79899 Other long term (current) drug therapy: Secondary | ICD-10-CM | POA: Diagnosis not present

## 2012-09-16 DIAGNOSIS — M542 Cervicalgia: Secondary | ICD-10-CM | POA: Diagnosis not present

## 2012-09-16 DIAGNOSIS — J039 Acute tonsillitis, unspecified: Secondary | ICD-10-CM | POA: Diagnosis not present

## 2012-09-16 DIAGNOSIS — I2789 Other specified pulmonary heart diseases: Secondary | ICD-10-CM | POA: Diagnosis not present

## 2012-09-16 DIAGNOSIS — Z853 Personal history of malignant neoplasm of breast: Secondary | ICD-10-CM | POA: Diagnosis not present

## 2012-09-17 DIAGNOSIS — J36 Peritonsillar abscess: Secondary | ICD-10-CM | POA: Diagnosis not present

## 2012-09-17 DIAGNOSIS — J029 Acute pharyngitis, unspecified: Secondary | ICD-10-CM | POA: Diagnosis not present

## 2012-09-19 ENCOUNTER — Telehealth: Payer: Self-pay | Admitting: Physician Assistant

## 2012-09-19 DIAGNOSIS — J029 Acute pharyngitis, unspecified: Secondary | ICD-10-CM | POA: Diagnosis not present

## 2012-09-19 NOTE — Telephone Encounter (Signed)
Patient missed dose of Rhythmol. She typically takes this at 2 PM and took it a 5 PM. She is now experiencing palpitations and knows she is back in a-fib. Advised to continue regularly scheduled Rhythmol dosing (8 PM tonight next dose) and take Cardizem PO 30 mg x 1. She has not missed Xarelto doses. She will continue to monitor her symptoms tonight. Advised she could take another Cardizem tablet in 4 hours if she is still in a-fib. If she remains in a-fib, and especially if HR increases, advised to either call the office in the morning or present to an ED. She understood and agreed.     Jacquelynn Cree, PA-C 09/19/2012 6:51 PM

## 2012-09-20 DIAGNOSIS — J8410 Pulmonary fibrosis, unspecified: Secondary | ICD-10-CM | POA: Diagnosis not present

## 2012-09-20 DIAGNOSIS — Z884 Allergy status to anesthetic agent status: Secondary | ICD-10-CM | POA: Diagnosis not present

## 2012-09-20 DIAGNOSIS — I079 Rheumatic tricuspid valve disease, unspecified: Secondary | ICD-10-CM | POA: Diagnosis not present

## 2012-09-20 DIAGNOSIS — R002 Palpitations: Secondary | ICD-10-CM | POA: Diagnosis not present

## 2012-09-20 DIAGNOSIS — R000 Tachycardia, unspecified: Secondary | ICD-10-CM | POA: Diagnosis not present

## 2012-09-20 DIAGNOSIS — Z883 Allergy status to other anti-infective agents status: Secondary | ICD-10-CM | POA: Diagnosis not present

## 2012-09-20 DIAGNOSIS — Z2821 Immunization not carried out because of patient refusal: Secondary | ICD-10-CM | POA: Diagnosis not present

## 2012-09-20 DIAGNOSIS — Z79899 Other long term (current) drug therapy: Secondary | ICD-10-CM | POA: Diagnosis not present

## 2012-09-20 DIAGNOSIS — Z853 Personal history of malignant neoplasm of breast: Secondary | ICD-10-CM | POA: Diagnosis not present

## 2012-09-20 DIAGNOSIS — I517 Cardiomegaly: Secondary | ICD-10-CM | POA: Diagnosis not present

## 2012-09-20 DIAGNOSIS — Z882 Allergy status to sulfonamides status: Secondary | ICD-10-CM | POA: Diagnosis not present

## 2012-09-20 DIAGNOSIS — I498 Other specified cardiac arrhythmias: Secondary | ICD-10-CM | POA: Diagnosis not present

## 2012-09-20 DIAGNOSIS — Z888 Allergy status to other drugs, medicaments and biological substances status: Secondary | ICD-10-CM | POA: Diagnosis not present

## 2012-09-20 DIAGNOSIS — I2789 Other specified pulmonary heart diseases: Secondary | ICD-10-CM | POA: Diagnosis not present

## 2012-09-20 DIAGNOSIS — D72829 Elevated white blood cell count, unspecified: Secondary | ICD-10-CM | POA: Diagnosis not present

## 2012-09-20 DIAGNOSIS — I050 Rheumatic mitral stenosis: Secondary | ICD-10-CM | POA: Diagnosis not present

## 2012-09-20 DIAGNOSIS — I369 Nonrheumatic tricuspid valve disorder, unspecified: Secondary | ICD-10-CM | POA: Diagnosis not present

## 2012-09-20 DIAGNOSIS — Z7901 Long term (current) use of anticoagulants: Secondary | ICD-10-CM | POA: Diagnosis not present

## 2012-09-21 DIAGNOSIS — J029 Acute pharyngitis, unspecified: Secondary | ICD-10-CM | POA: Diagnosis present

## 2012-09-21 DIAGNOSIS — Z5309 Procedure and treatment not carried out because of other contraindication: Secondary | ICD-10-CM | POA: Diagnosis not present

## 2012-09-21 DIAGNOSIS — I517 Cardiomegaly: Secondary | ICD-10-CM | POA: Diagnosis not present

## 2012-09-21 DIAGNOSIS — Z853 Personal history of malignant neoplasm of breast: Secondary | ICD-10-CM | POA: Diagnosis not present

## 2012-09-21 DIAGNOSIS — I050 Rheumatic mitral stenosis: Secondary | ICD-10-CM | POA: Diagnosis not present

## 2012-09-21 DIAGNOSIS — D72829 Elevated white blood cell count, unspecified: Secondary | ICD-10-CM | POA: Diagnosis not present

## 2012-09-21 DIAGNOSIS — I2789 Other specified pulmonary heart diseases: Secondary | ICD-10-CM | POA: Diagnosis present

## 2012-09-21 DIAGNOSIS — R918 Other nonspecific abnormal finding of lung field: Secondary | ICD-10-CM | POA: Diagnosis not present

## 2012-09-21 DIAGNOSIS — I498 Other specified cardiac arrhythmias: Secondary | ICD-10-CM | POA: Diagnosis not present

## 2012-09-21 DIAGNOSIS — R002 Palpitations: Secondary | ICD-10-CM | POA: Diagnosis not present

## 2012-09-21 DIAGNOSIS — F17200 Nicotine dependence, unspecified, uncomplicated: Secondary | ICD-10-CM | POA: Diagnosis present

## 2012-09-21 DIAGNOSIS — I359 Nonrheumatic aortic valve disorder, unspecified: Secondary | ICD-10-CM | POA: Diagnosis not present

## 2012-09-21 DIAGNOSIS — R0989 Other specified symptoms and signs involving the circulatory and respiratory systems: Secondary | ICD-10-CM | POA: Diagnosis not present

## 2012-09-21 DIAGNOSIS — I4891 Unspecified atrial fibrillation: Secondary | ICD-10-CM | POA: Diagnosis not present

## 2012-09-21 DIAGNOSIS — I495 Sick sinus syndrome: Secondary | ICD-10-CM | POA: Diagnosis not present

## 2012-09-21 DIAGNOSIS — J95821 Acute postprocedural respiratory failure: Secondary | ICD-10-CM | POA: Diagnosis not present

## 2012-09-21 DIAGNOSIS — I079 Rheumatic tricuspid valve disease, unspecified: Secondary | ICD-10-CM | POA: Diagnosis present

## 2012-09-21 DIAGNOSIS — I059 Rheumatic mitral valve disease, unspecified: Secondary | ICD-10-CM | POA: Diagnosis not present

## 2012-09-25 DIAGNOSIS — I517 Cardiomegaly: Secondary | ICD-10-CM | POA: Diagnosis not present

## 2012-10-03 DIAGNOSIS — I270 Primary pulmonary hypertension: Secondary | ICD-10-CM | POA: Diagnosis not present

## 2012-10-03 DIAGNOSIS — I4891 Unspecified atrial fibrillation: Secondary | ICD-10-CM | POA: Diagnosis not present

## 2012-10-03 DIAGNOSIS — J029 Acute pharyngitis, unspecified: Secondary | ICD-10-CM | POA: Diagnosis not present

## 2012-10-03 DIAGNOSIS — I050 Rheumatic mitral stenosis: Secondary | ICD-10-CM | POA: Diagnosis not present

## 2012-10-15 DIAGNOSIS — J029 Acute pharyngitis, unspecified: Secondary | ICD-10-CM | POA: Diagnosis not present

## 2012-10-25 DIAGNOSIS — R0602 Shortness of breath: Secondary | ICD-10-CM | POA: Diagnosis not present

## 2012-10-25 DIAGNOSIS — I4891 Unspecified atrial fibrillation: Secondary | ICD-10-CM | POA: Diagnosis not present

## 2012-10-25 DIAGNOSIS — Z9889 Other specified postprocedural states: Secondary | ICD-10-CM | POA: Diagnosis not present

## 2012-10-25 DIAGNOSIS — I059 Rheumatic mitral valve disease, unspecified: Secondary | ICD-10-CM | POA: Diagnosis not present

## 2012-11-30 ENCOUNTER — Ambulatory Visit (INDEPENDENT_AMBULATORY_CARE_PROVIDER_SITE_OTHER): Payer: Medicare Other | Admitting: Cardiovascular Disease

## 2012-11-30 ENCOUNTER — Encounter: Payer: Self-pay | Admitting: Cardiovascular Disease

## 2012-11-30 VITALS — BP 118/61 | HR 65 | Ht 68.5 in | Wt 212.0 lb

## 2012-11-30 DIAGNOSIS — I4891 Unspecified atrial fibrillation: Secondary | ICD-10-CM | POA: Diagnosis not present

## 2012-11-30 DIAGNOSIS — I050 Rheumatic mitral stenosis: Secondary | ICD-10-CM | POA: Diagnosis not present

## 2012-11-30 MED ORDER — AMIODARONE HCL 200 MG PO TABS: 200.0000 mg | ORAL_TABLET | Freq: Two times a day (BID) | ORAL | Status: DC

## 2012-11-30 MED ORDER — METOPROLOL TARTRATE 25 MG PO TABS: 25.0000 mg | ORAL_TABLET | Freq: Two times a day (BID) | ORAL | Status: DC
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
## 2012-11-30 NOTE — Assessment & Plan Note (Signed)
This was deemed to be moderate by recent evaluation at [facility] consistent with our previous evaluation in 2012. It is associated with moderate pulmonary hypertension.

## 2012-11-30 NOTE — Patient Instructions (Addendum)
Stop Multaq Start Amiodarone 200 mg twice daily for 1 week then 200 mg once daily.  Start Metoprolol 25 mg twice daily.  Follow up in 2 weeks in [place] (Add to my May 12 schedule).

## 2012-11-30 NOTE — Assessment & Plan Note (Signed)
Persistent atrial fibrillation currently in normal sinus rhythm with failure of multiple antiarrhythmic medications including flecainide, Rythmol and Multaq which she is on right now with very poor control of her atrial fibrillation. She is usually very symptomatic during episodes of A. fib and monitors her blood pressure and heart rate carefully. I suspect that she is extremely symptomatic during atrial fibrillation episodes due to the presence of moderate mitral stenosis and moderate pulmonary hypertension. Due to that, I think it's extremely important to keep her in sinus rhythm. I do think that atrial fibrillation ablation is reasonable in her situation but likely will be difficult due to mitral stenosis and atrial enlargement. I will be discussing with Dr. Rayann Heman and also with EP at [facility]. For now, she is not doing well on Multaq. Due to that, I will switch her to amiodarone 200 mg twice daily and also add metoprolol 25 mg twice daily. She will continue to monitor her blood pressure and heart rate. I had a prolonged discussion with her about all the side effects of amiodarone. Given her highly symptomatic burden of atrial fibrillation, I do feel that using amiodarone is justified in this situation until more definitive treatment is offered. Continue treatment with Xarelto. We have discussed before switching to warfarin given that she has moderate rheumatic mitral stenosis. However, she had very difficult time in the past maintaining therapeutic INR with warfarin and it might just make sense for now to continue with current treatment.

## 2012-12-04 ENCOUNTER — Telehealth: Payer: Self-pay | Admitting: *Deleted

## 2012-12-04 NOTE — Telephone Encounter (Signed)
Stated that she woke up in the middle of the night with pain on left side of face and head.  Heart rate was down to 43.  Was told to call office if dropped below 50.  This morning back up to 50 - 60's range.  States she feels fine now and no other complaints.

## 2012-12-04 NOTE — Telephone Encounter (Signed)
Decrease metoprolol to 12.5 mg bid.

## 2012-12-04 NOTE — Telephone Encounter (Signed)
Patient notified and verbalized understanding.  Follow up OV already scheduled for 5/12 with Dr. Fletcher Anon.

## 2012-12-06 ENCOUNTER — Telehealth: Payer: Self-pay

## 2012-12-06 NOTE — Telephone Encounter (Signed)
Attempted to contact patient again.  Patient states that her heart rate is going up and down.  Even as low as 40-50 sitting still.  States that she really can not tell if she is out of rhythm again.  Will send message to Dr. Fletcher Anon for advice.  In the meantime, scheduled patient for nurse visit in the morning for EKG & vitals.

## 2012-12-06 NOTE — Telephone Encounter (Signed)
12/06/2012 - 2:13 - LM on vm to return call.

## 2012-12-06 NOTE — Telephone Encounter (Signed)
I called her and left a message. Continue same medications for now. Agree with EKG and vitals.

## 2012-12-06 NOTE — Telephone Encounter (Signed)
Pt wanted to call and let us know her HR is in the 40s. States as of NOW it is 77, then she said 13. Pt states she has a question about her medication also.

## 2012-12-07 NOTE — Telephone Encounter (Signed)
Nurse visit cancelled by patient, message states that patient talked with Dr. Fletcher Anon last evening and he told her to cancell.  Has OV scheduled for 5/12 with Arida.

## 2012-12-17 ENCOUNTER — Ambulatory Visit (INDEPENDENT_AMBULATORY_CARE_PROVIDER_SITE_OTHER): Payer: Medicare Other | Admitting: Cardiovascular Disease

## 2012-12-17 ENCOUNTER — Encounter: Payer: Self-pay | Admitting: Cardiovascular Disease

## 2012-12-17 VITALS — BP 140/68 | HR 57 | Ht 68.0 in | Wt 209.5 lb

## 2012-12-17 DIAGNOSIS — R0600 Dyspnea, unspecified: Secondary | ICD-10-CM

## 2012-12-17 DIAGNOSIS — R0602 Shortness of breath: Secondary | ICD-10-CM

## 2012-12-17 DIAGNOSIS — I050 Rheumatic mitral stenosis: Secondary | ICD-10-CM

## 2012-12-17 DIAGNOSIS — I4891 Unspecified atrial fibrillation: Secondary | ICD-10-CM | POA: Diagnosis not present

## 2012-12-17 DIAGNOSIS — R0989 Other specified symptoms and signs involving the circulatory and respiratory systems: Secondary | ICD-10-CM

## 2012-12-17 DIAGNOSIS — R0609 Other forms of dyspnea: Secondary | ICD-10-CM | POA: Diagnosis not present

## 2012-12-17 NOTE — Patient Instructions (Addendum)
Your physician has requested that you have an echocardiogram. Echocardiography is a painless test that uses sound waves to create images of your heart. It provides your doctor with information about the size and shape of your heart and how well your heart's chambers and valves are working. This procedure takes approximately one hour. There are no restrictions for this procedure.  Do not take Metoprolol if heart rate is less than 50  Follow up in 3 months.

## 2012-12-17 NOTE — Progress Notes (Signed)
HPI  This is a 67 year old female who is here today for followup visit. She has persistent atrial fibrillation . She is on anticoagulation with Xarelto without any reported side effects. In the past when she was on warfarin she had significant difficulty in maintaining therapeutic INR. 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 No coronary artery disease on previous cardiac catheterization. She was hospitalized at [facility] in August of 2013 with atrial fibrillation with rapid ventricular response. She underwent successful DCCV. The dose of Rythmol was increased at that time to 300 mg 3 times daily. Diltiazem was subsequently stopped due to bradycardia.  She presented in February of 2014 to [facility] with palpitations and reported heart rate of 130 beats per minute. She is usually very symptomatic when she goes into atrial fibrillation. She took a dose of diltiazem and by the time she arrived to the emergency room she was in sinus bradycardia. This happened during the snowstorm and we were not available for coverage. She was seen by Dr. Dannielle Burn who repeated the echocardiogram and was concerned about possibility of severe mitral stenosis. A TEE was performed showed normal LV systolic function with moderately dilated left atrium, moderate to severe mitral stenosis with a calculated valve area of 1.3, severe tricuspid regurgitation with moderate pulmonary hypertension with an estimated systolic pulmonary pressure between 55-65 mm mercury. She was transferred to [facility] for consideration of mitral valve balloon valvuloplasty. It appears that the procedure was planned and they proceeded with that. However, after performing trans-septal puncture, it was noted that the mitral valve gradient was only 6 mmHg even with pacing at 130 beats per minute. Thus, they did not proceed with valvuloplasty. This hemodynamic evaluation is consistent with the  evaluation I performed in 2012 with a right and left cardiac catheterization. The patient was switched from Rythmol to [place].  We have tried Multaq before with no significant control of her atrial fibrillation.  There was plans of switching her from Xarelto to warfarin but the patient has a strong opinion against warfarin given the difficulty in the past in controlling her INR. During last visit, I switched Multaq to amiodarone. She reports no further tachycardia or palpitations since then. She did have bradycardia with heart rate less than 50 beats per minute but over all is asymptomatic. She feels that her dyspnea worsened after her hospitalization at [facility].  Allergies  Allergen Reactions  . Darvon   . Levofloxacin   . Nitrofuran Derivatives     (generic for Macrobid)  . Pentazocine Lactate   . Procaine Hcl   . Procaine Hcl   . Propoxyphene Hcl   . Septra (Sulfamethoxazole W/Trimethoprim (Co-Trimoxazole))   . Clodronic Acid Rash  . Epinephrine Palpitations    Pt is unsure of this reaction  . Lidocaine Palpitations  . Propoxyphene Palpitations     Current Outpatient Prescriptions on File Prior to Visit  Medication Sig Dispense Refill  . amiodarone (PACERONE) 200 MG tablet Take 1 tablet (200 mg total) by mouth 2 (two) times daily.  60 tablet  3  . AZOPT 1 % ophthalmic suspension Place 1 drop into both eyes Twice daily.      . furosemide (LASIX) 20 MG tablet Take 1 tablet (20 mg total) by mouth as needed.  30 tablet  6  . metoprolol tartrate (LOPRESSOR) 25 MG tablet Take 1 tablet (25 mg total) by mouth 2 (two) times daily.  60 tablet  6  .  nitroGLYCERIN (NITROSTAT) 0.4 MG SL tablet Place 1 tablet (0.4 mg total) under the tongue every 5 (five) minutes as needed.  25 tablet  6  . Rivaroxaban (XARELTO) 20 MG TABS Take 1 tablet (20 mg total) by mouth daily.  90 tablet  3   No current facility-administered medications on file prior to visit.     Past Medical History    Diagnosis Date  . Chest pain     normal coronary angiogram in december 2008  . Mild aortic regurgitation with left ventricular dilation by prior echocardiogram   . Atherosclerotic cerebrovascular disease     nonobstructive  . Breast mass, right   . Silicone leakage from breast implant   . Dyspnea     chronic exertional dyspnea related to intermittant atrial  . Carotid bruit     right   . Pneumonia   . Rheumatic fever     as a child  . Breast cancer     remote right breast mastectomy,bilaterial implants  . Breast cancer     radical mastectomy status post billaterial breast implants and right breast implat rupture  . Diabetes mellitus     diet controlled  . Mitral stenosis     moderate  . 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 Atrial fibrillation     post cardioversion maintain normal sinus rhythm     Past Surgical History  Procedure Laterality Date  . Appendectomy    . Cholecystectomy    . Total abdominal hysterectomy    . Cardiac catheterization  2008    no significant CAD  . Cardiac catheterization  04/2011    No significant CAD, moderate mitral stenosis (mean gradient: 12 mm Hg, MVA: 1.83), moderate pulmonary hypertension (PVR: 2.4 Woods units), Normal LVEDP.      History reviewed. No pertinent family history.   History   Social History  . Marital Status: Married    Spouse Name: N/A    Number of Children: N/A  . Years of Education: N/A   Occupational History  .      lives in [place] History Main Topics  . Smoking status: Former Smoker -- 0.30 packs/day for 10 years    Types: Cigarettes    Quit date: 08/09/1983  . Smokeless tobacco: Never Used  . Alcohol Use: No  . Drug Use: No  . Sexually Active: Not on file   Other Topics Concern  . Not on file   Social History Narrative  . No narrative on file     PHYSICAL EXAM   BP 140/68  Ht 5\' 8"  (1.727 m)  Wt 209 lb 8 oz (95.029 kg)  BMI 31.86  kg/m2  Constitutional: She is oriented to person, place, and time. She appears well-developed and well-nourished. No distress.  HENT: No nasal discharge.  Head: Normocephalic and atraumatic.  Eyes: Pupils are equal, round, and reactive to light. Right eye exhibits no discharge. Left eye exhibits no discharge.  Neck: Normal range of motion. Neck supple. No JVD present. No thyromegaly present.  Cardiovascular: Normal rate, regular rhythm, normal heart sounds. Exam reveals no gallop and no friction rub.  Is a 2/6 systolic ejection murmur at the base of the heart. Pulmonary/Chest: Effort normal and breath sounds normal. No stridor. No respiratory distress. She has no wheezes. She has no rales. She exhibits no tenderness.  Abdominal: Soft. Bowel sounds are normal. She exhibits no distension. There is no  tenderness. There is no rebound and no guarding.  Musculoskeletal: Normal range of motion. She exhibits no edema and no tenderness.  Neurological: She is alert and oriented to person, place, and time. Coordination normal.  Skin: Skin is warm and dry. No rash noted. She is not diaphoretic. No erythema. No pallor.  Psychiatric: She has a normal mood and affect. Her behavior is normal. Judgment and thought content normal.     [id number]  Bradycardia , normal QT interval WITHIN NORMAL LIMITS   ASSESSMENT AND PLAN

## 2012-12-17 NOTE — Assessment & Plan Note (Addendum)
She is maintaining a normal sinus rhythm with amiodarone. I discussed the case with Dr. Rayann Heman. Given her dilated left atrium and moderate mitral stenosis, this will be a difficult procedure for ablation. The plan for now is to keep her on amiodarone. If she fails this medication, the plan is to the floor her to [facility] for convergent ablation. Continue anticoagulation with Xarelto for now. Check liver and thyroid function in 3 months. I asked her not to take metoprolol if her heart rate is less than 50 beats per minute.

## 2012-12-17 NOTE — Assessment & Plan Note (Signed)
This was moderately most recent evaluation.

## 2012-12-17 NOTE — Assessment & Plan Note (Signed)
I am concerned about her worsening dyspnea after her recent hospitalization and procedure at [facility]. They did trans-septal puncture and thus I will obtain an echocardiogram to make sure there are no structural abnormalities related to that.

## 2012-12-19 ENCOUNTER — Other Ambulatory Visit (INDEPENDENT_AMBULATORY_CARE_PROVIDER_SITE_OTHER): Payer: Medicare Other

## 2012-12-19 ENCOUNTER — Other Ambulatory Visit: Payer: Self-pay

## 2012-12-19 DIAGNOSIS — R0602 Shortness of breath: Secondary | ICD-10-CM

## 2012-12-19 DIAGNOSIS — I4891 Unspecified atrial fibrillation: Secondary | ICD-10-CM

## 2012-12-20 DIAGNOSIS — H01009 Unspecified blepharitis unspecified eye, unspecified eyelid: Secondary | ICD-10-CM | POA: Diagnosis not present

## 2012-12-20 DIAGNOSIS — H2589 Other age-related cataract: Secondary | ICD-10-CM | POA: Diagnosis not present

## 2012-12-20 DIAGNOSIS — H18519 Endothelial corneal dystrophy, unspecified eye: Secondary | ICD-10-CM | POA: Diagnosis not present

## 2012-12-20 DIAGNOSIS — H5231 Anisometropia: Secondary | ICD-10-CM | POA: Diagnosis not present

## 2013-01-01 ENCOUNTER — Other Ambulatory Visit: Payer: Self-pay | Admitting: *Deleted

## 2013-01-01 DIAGNOSIS — I050 Rheumatic mitral stenosis: Secondary | ICD-10-CM | POA: Diagnosis not present

## 2013-01-01 DIAGNOSIS — Z79899 Other long term (current) drug therapy: Secondary | ICD-10-CM

## 2013-01-01 DIAGNOSIS — R0602 Shortness of breath: Secondary | ICD-10-CM | POA: Diagnosis not present

## 2013-01-01 DIAGNOSIS — I10 Essential (primary) hypertension: Secondary | ICD-10-CM

## 2013-01-01 DIAGNOSIS — I4891 Unspecified atrial fibrillation: Secondary | ICD-10-CM | POA: Diagnosis not present

## 2013-01-01 DIAGNOSIS — R0609 Other forms of dyspnea: Secondary | ICD-10-CM | POA: Diagnosis not present

## 2013-01-03 ENCOUNTER — Encounter: Payer: Self-pay | Admitting: *Deleted

## 2013-01-16 DIAGNOSIS — Z79899 Other long term (current) drug therapy: Secondary | ICD-10-CM | POA: Diagnosis not present

## 2013-01-16 DIAGNOSIS — R109 Unspecified abdominal pain: Secondary | ICD-10-CM | POA: Diagnosis not present

## 2013-01-16 DIAGNOSIS — I509 Heart failure, unspecified: Secondary | ICD-10-CM | POA: Diagnosis not present

## 2013-01-16 DIAGNOSIS — R42 Dizziness and giddiness: Secondary | ICD-10-CM | POA: Diagnosis not present

## 2013-01-16 DIAGNOSIS — I4891 Unspecified atrial fibrillation: Secondary | ICD-10-CM | POA: Diagnosis not present

## 2013-01-16 DIAGNOSIS — Z853 Personal history of malignant neoplasm of breast: Secondary | ICD-10-CM | POA: Diagnosis not present

## 2013-01-16 DIAGNOSIS — Z8249 Family history of ischemic heart disease and other diseases of the circulatory system: Secondary | ICD-10-CM | POA: Diagnosis not present

## 2013-01-16 DIAGNOSIS — Z7901 Long term (current) use of anticoagulants: Secondary | ICD-10-CM | POA: Diagnosis not present

## 2013-01-16 DIAGNOSIS — R112 Nausea with vomiting, unspecified: Secondary | ICD-10-CM | POA: Diagnosis not present

## 2013-01-16 DIAGNOSIS — I2789 Other specified pulmonary heart diseases: Secondary | ICD-10-CM | POA: Diagnosis not present

## 2013-01-17 DIAGNOSIS — H8110 Benign paroxysmal vertigo, unspecified ear: Secondary | ICD-10-CM | POA: Diagnosis not present

## 2013-01-31 DIAGNOSIS — Z9010 Acquired absence of unspecified breast and nipple: Secondary | ICD-10-CM | POA: Diagnosis not present

## 2013-01-31 DIAGNOSIS — Z1231 Encounter for screening mammogram for malignant neoplasm of breast: Secondary | ICD-10-CM | POA: Diagnosis not present

## 2013-01-31 DIAGNOSIS — Z853 Personal history of malignant neoplasm of breast: Secondary | ICD-10-CM | POA: Diagnosis not present

## 2013-02-08 ENCOUNTER — Other Ambulatory Visit: Payer: Self-pay | Admitting: Physician Assistant

## 2013-02-09 DIAGNOSIS — J029 Acute pharyngitis, unspecified: Secondary | ICD-10-CM | POA: Diagnosis not present

## 2013-02-09 DIAGNOSIS — H6120 Impacted cerumen, unspecified ear: Secondary | ICD-10-CM | POA: Diagnosis not present

## 2013-02-09 DIAGNOSIS — I10 Essential (primary) hypertension: Secondary | ICD-10-CM | POA: Diagnosis not present

## 2013-02-11 DIAGNOSIS — J019 Acute sinusitis, unspecified: Secondary | ICD-10-CM | POA: Diagnosis not present

## 2013-02-11 DIAGNOSIS — H6980 Other specified disorders of Eustachian tube, unspecified ear: Secondary | ICD-10-CM | POA: Diagnosis not present

## 2013-02-16 DIAGNOSIS — R5381 Other malaise: Secondary | ICD-10-CM | POA: Diagnosis not present

## 2013-02-16 DIAGNOSIS — R110 Nausea: Secondary | ICD-10-CM | POA: Diagnosis not present

## 2013-02-16 DIAGNOSIS — E039 Hypothyroidism, unspecified: Secondary | ICD-10-CM | POA: Diagnosis not present

## 2013-02-16 DIAGNOSIS — H8110 Benign paroxysmal vertigo, unspecified ear: Secondary | ICD-10-CM | POA: Diagnosis not present

## 2013-02-16 DIAGNOSIS — R7309 Other abnormal glucose: Secondary | ICD-10-CM | POA: Diagnosis not present

## 2013-02-21 DIAGNOSIS — Z5181 Encounter for therapeutic drug level monitoring: Secondary | ICD-10-CM | POA: Diagnosis not present

## 2013-02-21 DIAGNOSIS — R42 Dizziness and giddiness: Secondary | ICD-10-CM | POA: Diagnosis not present

## 2013-02-21 DIAGNOSIS — H903 Sensorineural hearing loss, bilateral: Secondary | ICD-10-CM | POA: Diagnosis not present

## 2013-02-21 DIAGNOSIS — H918X9 Other specified hearing loss, unspecified ear: Secondary | ICD-10-CM | POA: Diagnosis not present

## 2013-02-25 DIAGNOSIS — H9190 Unspecified hearing loss, unspecified ear: Secondary | ICD-10-CM | POA: Diagnosis not present

## 2013-03-07 DIAGNOSIS — H918X9 Other specified hearing loss, unspecified ear: Secondary | ICD-10-CM | POA: Diagnosis not present

## 2013-03-07 DIAGNOSIS — R42 Dizziness and giddiness: Secondary | ICD-10-CM | POA: Diagnosis not present

## 2013-03-07 DIAGNOSIS — H903 Sensorineural hearing loss, bilateral: Secondary | ICD-10-CM | POA: Diagnosis not present

## 2013-03-14 DIAGNOSIS — IMO0001 Reserved for inherently not codable concepts without codable children: Secondary | ICD-10-CM | POA: Diagnosis not present

## 2013-03-14 DIAGNOSIS — R42 Dizziness and giddiness: Secondary | ICD-10-CM | POA: Diagnosis not present

## 2013-03-18 DIAGNOSIS — R42 Dizziness and giddiness: Secondary | ICD-10-CM | POA: Diagnosis not present

## 2013-03-18 DIAGNOSIS — IMO0001 Reserved for inherently not codable concepts without codable children: Secondary | ICD-10-CM | POA: Diagnosis not present

## 2013-03-19 ENCOUNTER — Telehealth: Payer: Self-pay | Admitting: *Deleted

## 2013-03-19 NOTE — Telephone Encounter (Signed)
Patient called she is having symptoms of lbp, dizzy and ear problems has some questions of side effects of amiodarone 200mg 

## 2013-03-19 NOTE — Telephone Encounter (Signed)
Pt saw Dr Fletcher Anon 12/17/12.  Pt was started on Amiodarone 200mg  QD.  Pt is taking Amiodarone 200mg  QD and Metoprolol 12.5mg  BID.  Pt went to ED and treated for vertigo in June.  Pt states she has had dizziness and diaphoresis and mild CP associated with these vertigo episodes.  Pt has a partial hearing loss R ear.  Had MRI done, no cause for hearing loss.  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  Pt states she is now having problems with her eyes. Saw opthalmologist for eye exam prior to start of Amiodarone and no problems were detected.  Pt states her HR is running in the 40's daily after taking Metoprolol.  Pt thinks new problems are being caused by Amiodarone. Pt advised Dr Fletcher Anon is out of town will forward to Dr Rockey Situ to assess. Please advise.

## 2013-03-20 NOTE — Telephone Encounter (Signed)
She could try amiodarone 100 mg daily Metoprolol 12.5 mg daily If heart rate continues to run 40s to 50s, would  hold the metoprolol

## 2013-03-21 NOTE — Telephone Encounter (Signed)
Pt called stating that her HR is still in the 40-50 range.  She did not take her amiodarone yesterday or Tuesday.  Instructed pt to take a 100mg  amiodarone and hold off on the metoprolol.  She has an appt scheduled with Dr. Fletcher Anon on Monday, but will call if her symptoms continue.

## 2013-03-25 ENCOUNTER — Encounter: Payer: Self-pay | Admitting: Cardiovascular Disease

## 2013-03-25 ENCOUNTER — Ambulatory Visit (INDEPENDENT_AMBULATORY_CARE_PROVIDER_SITE_OTHER): Payer: Medicare Other | Admitting: Cardiovascular Disease

## 2013-03-25 VITALS — BP 132/78 | HR 59 | Ht 68.0 in | Wt 209.2 lb

## 2013-03-25 DIAGNOSIS — I050 Rheumatic mitral stenosis: Secondary | ICD-10-CM

## 2013-03-25 DIAGNOSIS — I4891 Unspecified atrial fibrillation: Secondary | ICD-10-CM

## 2013-03-25 DIAGNOSIS — R079 Chest pain, unspecified: Secondary | ICD-10-CM

## 2013-03-25 DIAGNOSIS — I2789 Other specified pulmonary heart diseases: Secondary | ICD-10-CM | POA: Diagnosis not present

## 2013-03-25 MED ORDER — RIVAROXABAN 20 MG PO TABS: 20.0000 mg | ORAL_TABLET | Freq: Every day | ORAL | Status: DC

## 2013-03-25 MED ORDER — PROPAFENONE HCL 150 MG PO TABS: 150.0000 mg | ORAL_TABLET | Freq: Three times a day (TID) | ORAL | Status: DC

## 2013-03-25 MED ORDER — PROPAFENONE HCL 300 MG PO TABS: 300.0000 mg | ORAL_TABLET | Freq: Three times a day (TID) | ORAL | Status: DC

## 2013-03-25 NOTE — Assessment & Plan Note (Signed)
She is maintaining normal sinus rhythm on amiodarone. However, she is having unexplained neurologic symptoms which could be due to amiodarone given the temporal association. I discussed with her 3 different management options: One of them would be to continue amiodarone and monitor her symptoms, the second is to switch back to Propafenone which reasonably controlled had A. fib. The third option which I recommended was to refer her to [facility] for convergent ablation. She is extremely hesitant to undergo any more procedures after her bad experience at [facility].  Thus, after a prolonged discussion I elected to switch her to Propafenone. She has been taking amiodarone 100 mg daily which was stopped today. Propafenone will be started on Friday at 150 mg every 8 hours and increased gradually to 300 mg every 8 hours she was on before. She is to resume metoprolol 25 mg twice daily. I will obtain an EKG within one week of starting propafenone. Continue anticoagulation with Xarelto.

## 2013-03-25 NOTE — Patient Instructions (Addendum)
Stop Amiodarone.  Resume Metoprolol 25 mg twice daily. Call us if heart rate goes below 50  Start Propafenone (Rythmol) 150 mg every 8 hours, increase to 225 mg every 8 hours after 4 days, increase to 300 mg every 8 hours after 4 days.   EKG to be done in [place] office in 10 days from now.   Follow up in 3 months.

## 2013-03-25 NOTE — Addendum Note (Signed)
Addended by: Merilyn Baba on: 03/25/2013 12:58 PM   Modules accepted: Orders

## 2013-03-25 NOTE — Assessment & Plan Note (Signed)
The degree of pulmonary hypertension seems to be out of proportion to severity of mitral stenosis. She reports no symptoms of sleep apnea but it's probably a good idea to evaluate her for that with a sleep study.

## 2013-03-25 NOTE — Assessment & Plan Note (Signed)
This was moderate by echocardiogram and cardiac catheterization.

## 2013-03-25 NOTE — Progress Notes (Signed)
HPI  This is a 67 year old female who is here today for followup visit. She has persistent atrial fibrillation . She is on anticoagulation with Xarelto without any reported side effects. In the past when she was on warfarin she had significant difficulty in maintaining therapeutic INR. 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 No coronary artery disease on previous cardiac catheterization. She was hospitalized at [facility] in August of 2013 with atrial fibrillation with rapid ventricular response. She underwent successful DCCV. The dose of Rythmol was increased at that time to 300 mg 3 times daily. Diltiazem was subsequently stopped due to bradycardia.  She presented in February of 2014 to [facility] with palpitations and reported heart rate of 130 beats per minute. She is usually very symptomatic when she goes into atrial fibrillation. She took a dose of diltiazem and by the time she arrived to the emergency room she was in sinus bradycardia.  She was seen by Dr. Dannielle Burn who repeated the echocardiogram and was concerned about possibility of severe mitral stenosis. A TEE was performed showed normal LV systolic function with moderately dilated left atrium, moderate to severe mitral stenosis with a calculated valve area of 1.3, severe tricuspid regurgitation with moderate pulmonary hypertension with an estimated systolic pulmonary pressure between 55-65 mm mercury. She was transferred to [facility] for consideration of mitral valve balloon valvuloplasty.  However, after performing trans-septal puncture, it was noted that the mitral valve gradient was only 6 mmHg even with pacing at 130 beats per minute. Thus, they did not proceed with valvuloplasty. This hemodynamic evaluation is consistent with the evaluation I performed in 2012 with a right and left cardiac catheterization. The patient was switched from Rythmol to [place].  We have  tried Multaq before with no significant control of her atrial fibrillation.  There was plans of switching her from Xarelto to warfarin but the patient has a strong opinion against warfarin given the difficulty in the past in controlling her INR. During last visit, I switched Multaq to amiodarone due to continued palpitations and suspected breakthrough atrial fibrillation .  She had worsening dyspnea after that hospitalization and thus I repeated echocardiogram which showed normal LV systolic function, moderate mitral stenosis and severe pulmonary hypertension. Over the last 2 months, she experienced symptoms of nausea, dizziness, balance problems, vertigo and right-sided hearing loss of unclear etiology. She was evaluated by Dr. Redmond Pulling in [place]. She had an MRI of the brain done which was unremarkable. She also reports blurred vision. No recurrent palpitations. She has noticed dizziness with bradycardia with heart rate going down to 44 beats per minute. She called our office and thus we decreased the amiodarone to 200 mg once daily and held metoprolol.  Allergies  Allergen Reactions  . Darvon   . Levofloxacin   . Nitrofuran Derivatives     (generic for Macrobid)  . Pentazocine Lactate   . Procaine Hcl   . Procaine Hcl   . Propoxyphene Hcl   . Septra [Sulfamethoxazole W/Trimethoprim (Co-Trimoxazole)]   . Clodronic Acid Rash  . Epinephrine Palpitations    Pt is unsure of this reaction  . Lidocaine Palpitations  . Propoxyphene Palpitations     Current Outpatient Prescriptions on File Prior to Visit  Medication Sig Dispense Refill  . acetaminophen (TYLENOL) 325 MG tablet 650 mg. Take 650 mg by mouth every 6 (six) hours as needed.      Marland Kitchen amiodarone (PACERONE) 200 MG tablet Take  100 mg by mouth daily.       . AZOPT 1 % ophthalmic suspension Place 1 drop into both eyes Twice daily.      . furosemide (LASIX) 20 MG tablet Take 1 tablet (20 mg total) by mouth as needed.  30 tablet  6  .  nitroGLYCERIN (NITROSTAT) 0.4 MG SL tablet Place 1 tablet (0.4 mg total) under the tongue every 5 (five) minutes as needed.  25 tablet  6  . Rivaroxaban (XARELTO) 20 MG TABS Take 1 tablet (20 mg total) by mouth daily.  90 tablet  3  . metoprolol tartrate (LOPRESSOR) 25 MG tablet Take 12.5 mg by mouth 2 (two) times daily.       No current facility-administered medications on file prior to visit.     Past Medical History  Diagnosis Date  . Chest pain     normal coronary angiogram in december 2008  . Mild aortic regurgitation with left ventricular dilation by prior echocardiogram   . Atherosclerotic cerebrovascular disease     nonobstructive  . Breast mass, right   . Silicone leakage from breast implant   . Dyspnea     chronic exertional dyspnea related to intermittant atrial  . Carotid bruit     right   . Pneumonia   . Rheumatic fever     as a child  . Breast cancer     remote right breast mastectomy,bilaterial implants  . Breast cancer     radical mastectomy status post billaterial breast implants and right breast implat rupture  . Diabetes mellitus     diet controlled  . Mitral stenosis     moderate  . 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 Atrial fibrillation     post cardioversion maintain normal sinus rhythm     Past Surgical History  Procedure Laterality Date  . Appendectomy    . Cholecystectomy    . Total abdominal hysterectomy    . Cardiac catheterization  2008    no significant CAD  . Cardiac catheterization  04/2011    No significant CAD, moderate mitral stenosis (mean gradient: 12 mm Hg, MVA: 1.83), moderate pulmonary hypertension (PVR: 2.4 Woods units), Normal LVEDP.      Family History  Problem Relation Age of Onset  . Family history unknown: Yes     History   Social History  . Marital Status: Married    Spouse Name: N/A    Number of Children: N/A  . Years of Education: N/A   Occupational History  .      lives  in [place] History Main Topics  . Smoking status: Former Smoker -- 0.30 packs/day for 10 years    Types: Cigarettes    Quit date: 08/09/1983  . Smokeless tobacco: Never Used  . Alcohol Use: No  . Drug Use: No  . Sexual Activity: Not on file   Other Topics Concern  . Not on file   Social History Narrative  . No narrative on file     PHYSICAL EXAM   BP 132/78  Ht 5\' 8"  (1.727 m)  Wt 209 lb 4 oz (94.915 kg)  BMI 31.82 kg/m2  Constitutional: She is oriented to person, place, and time. She appears well-developed and well-nourished. No distress.  HENT: No nasal discharge.  Head: Normocephalic and atraumatic.  Eyes: Pupils are equal, round, and reactive to light. Right eye exhibits no discharge. Left eye exhibits no  discharge.  Neck: Normal range of motion. Neck supple. No JVD present. No thyromegaly present.  Cardiovascular: Normal rate, regular rhythm, normal heart sounds. Exam reveals no gallop and no friction rub. There a 2/6 systolic ejection murmur at the base of the heart. Pulmonary/Chest: Effort normal and breath sounds normal. No stridor. No respiratory distress. She has no wheezes. She has no rales. She exhibits no tenderness.  Abdominal: Soft. Bowel sounds are normal. She exhibits no distension. There is no tenderness. There is no rebound and no guarding.  Musculoskeletal: Normal range of motion. She exhibits no edema and no tenderness.  Neurological: She is alert and oriented to person, place, and time. Coordination normal.  Skin: Skin is warm and dry. No rash noted. She is not diaphoretic. No erythema. No pallor.  Psychiatric: She has a normal mood and affect. Her behavior is normal. Judgment and thought content normal.     [id number]  Bradycardia , normal QT interval WITHIN NORMAL LIMITS    ASSESSMENT AND PLAN

## 2013-03-29 ENCOUNTER — Telehealth: Payer: Self-pay

## 2013-03-29 NOTE — Telephone Encounter (Signed)
Pt is in agreeance with these instructions.

## 2013-03-29 NOTE — Telephone Encounter (Signed)
Pt has questions about metoprolol. Please call. Pt hasnt taken any medication this morning and her pulse is 48.

## 2013-03-29 NOTE — Telephone Encounter (Signed)
Pt called wanting clarification on her meds.  On 8/18 visit, pt given instructions:  "Stop Amiodarone.  Resume Metoprolol 25 mg twice daily. Call us if heart rate goes below 50  Start Propafenone (Rythmol) 150 mg every 8 hours, increase to 225 mg every 8 hours after 4 days, increase to 300 mg every 8 hours after 4 days"  Pt states that she took a whole metoprolol this morning with her rythmol and her heart rate was 48.  She wants to know if you would like her to continue to try a whole metoprolol or if you want her to "go back to cutting it half".  Also, her prescription reads "Lasix as needed" but she states that she is "sure that you said to take it everyday".  She would like clarification on this, as well.  Thank you!

## 2013-03-29 NOTE — Telephone Encounter (Signed)
If heart rate is <50 then take 12.5 mg of Metoprolol instead of 25.  Lasix is everyday.

## 2013-04-02 ENCOUNTER — Telehealth: Payer: Self-pay

## 2013-04-02 NOTE — Telephone Encounter (Signed)
Rythmol does not slow down the heart as much as Metoprolol.  Increase the of dose of Rythmol to 225 mg q8 hours and do not increase to 300 mg as was planned before.  Continue Metoprolol 12.5 mg bid but hold if HR is less than 50

## 2013-04-02 NOTE — Telephone Encounter (Signed)
Pt states she started on Arythmol 150 and increase it, metoprolol 25 mg she took once yesterday, states the last 2 days she took 1/2 of metoprolol, states her HR was 51 after walking up the hill this morning to her mailbox, has been running 43-47, . Please Arnoldo Hooker

## 2013-04-02 NOTE — Telephone Encounter (Signed)
Spoke w/ pt.  She verbalizes understanding of new instructions and repeated them back to me.

## 2013-04-02 NOTE — Telephone Encounter (Signed)
Spoke w/ pt.  She is currently taking Rythmol 150mg  w/ instructions to increase to 225mg , but due to sx of dizziness, exhaustion, and HRs in 40-50s, she is afraid to increase the dose.  She was instructed to take 1/2 metoprolol 25mg , but said that she has been "self medicating" and took 25 mg yesterday.  She would like advise on whether she should increase her dose of Rhythmol as instructed or continue on 150mg .  Thank you.

## 2013-04-04 ENCOUNTER — Ambulatory Visit (INDEPENDENT_AMBULATORY_CARE_PROVIDER_SITE_OTHER): Payer: Medicare Other | Admitting: *Deleted

## 2013-04-04 ENCOUNTER — Encounter: Payer: Self-pay | Admitting: *Deleted

## 2013-04-04 VITALS — BP 129/83 | HR 54 | Ht 68.5 in | Wt 214.0 lb

## 2013-04-04 DIAGNOSIS — R42 Dizziness and giddiness: Secondary | ICD-10-CM | POA: Diagnosis not present

## 2013-04-04 DIAGNOSIS — H918X9 Other specified hearing loss, unspecified ear: Secondary | ICD-10-CM | POA: Diagnosis not present

## 2013-04-04 DIAGNOSIS — I4891 Unspecified atrial fibrillation: Secondary | ICD-10-CM

## 2013-04-10 ENCOUNTER — Telehealth: Payer: Self-pay | Admitting: Cardiology

## 2013-04-10 NOTE — Telephone Encounter (Signed)
Informed pt that Dr. April Holding reviewed EKG and said it looked good.

## 2013-04-15 ENCOUNTER — Other Ambulatory Visit: Payer: Self-pay

## 2013-04-15 MED ORDER — FUROSEMIDE 20 MG PO TABS: 20.0000 mg | ORAL_TABLET | Freq: Every day | ORAL | Status: DC

## 2013-04-15 MED ORDER — PROPAFENONE HCL 225 MG PO TABS: 225.0000 mg | ORAL_TABLET | Freq: Three times a day (TID) | ORAL | Status: DC

## 2013-04-15 NOTE — Telephone Encounter (Signed)
Refilled Furosemide and Rythmol 225 mg pt could not take 300 mg  bc she could not tolerate asked to stay on 225 mg tid.

## 2013-04-15 NOTE — Telephone Encounter (Signed)
Pt has question regarding rx for Rythmol 225mg  please call

## 2013-05-16 DIAGNOSIS — I4891 Unspecified atrial fibrillation: Secondary | ICD-10-CM | POA: Diagnosis not present

## 2013-05-16 DIAGNOSIS — R1084 Generalized abdominal pain: Secondary | ICD-10-CM | POA: Diagnosis not present

## 2013-05-16 DIAGNOSIS — Z23 Encounter for immunization: Secondary | ICD-10-CM | POA: Diagnosis not present

## 2013-05-16 DIAGNOSIS — K5900 Constipation, unspecified: Secondary | ICD-10-CM | POA: Diagnosis not present

## 2013-05-16 DIAGNOSIS — H8110 Benign paroxysmal vertigo, unspecified ear: Secondary | ICD-10-CM | POA: Diagnosis not present

## 2013-05-29 DIAGNOSIS — R42 Dizziness and giddiness: Secondary | ICD-10-CM | POA: Diagnosis not present

## 2013-05-29 DIAGNOSIS — H9190 Unspecified hearing loss, unspecified ear: Secondary | ICD-10-CM | POA: Diagnosis not present

## 2013-06-02 ENCOUNTER — Other Ambulatory Visit: Payer: Self-pay | Admitting: Cardiovascular Disease

## 2013-06-03 ENCOUNTER — Other Ambulatory Visit: Payer: Self-pay | Admitting: *Deleted

## 2013-06-03 MED ORDER — FUROSEMIDE 20 MG PO TABS: 20.0000 mg | ORAL_TABLET | Freq: Every day | ORAL | Status: DC

## 2013-06-03 NOTE — Telephone Encounter (Signed)
Requested Prescriptions   Signed Prescriptions Disp Refills  . furosemide (LASIX) 20 MG tablet 30 tablet 3    Sig: Take 1 tablet (20 mg total) by mouth daily.    Authorizing Provider: Kathlyn Sacramento A    Ordering User: Britt Bottom

## 2013-06-27 ENCOUNTER — Ambulatory Visit: Payer: Medicare Other | Admitting: Cardiovascular Disease

## 2013-06-28 ENCOUNTER — Ambulatory Visit: Payer: Medicare Other | Admitting: Cardiovascular Disease

## 2013-07-01 ENCOUNTER — Encounter: Payer: Self-pay | Admitting: Cardiovascular Disease

## 2013-07-01 ENCOUNTER — Ambulatory Visit (INDEPENDENT_AMBULATORY_CARE_PROVIDER_SITE_OTHER): Payer: Medicare Other | Admitting: Cardiovascular Disease

## 2013-07-01 VITALS — BP 128/64 | HR 50 | Ht 68.5 in | Wt 215.5 lb

## 2013-07-01 DIAGNOSIS — I2789 Other specified pulmonary heart diseases: Secondary | ICD-10-CM | POA: Diagnosis not present

## 2013-07-01 DIAGNOSIS — I050 Rheumatic mitral stenosis: Secondary | ICD-10-CM

## 2013-07-01 DIAGNOSIS — I4891 Unspecified atrial fibrillation: Secondary | ICD-10-CM | POA: Diagnosis not present

## 2013-07-01 MED ORDER — RIVAROXABAN 20 MG PO TABS: 20.0000 mg | ORAL_TABLET | Freq: Every day | ORAL | Status: DC

## 2013-07-01 MED ORDER — METOPROLOL TARTRATE 25 MG PO TABS: 12.5000 mg | ORAL_TABLET | Freq: Two times a day (BID) | ORAL | Status: DC

## 2013-07-01 MED ORDER — PROPAFENONE HCL 225 MG PO TABS: 225.0000 mg | ORAL_TABLET | Freq: Three times a day (TID) | ORAL | Status: DC

## 2013-07-01 NOTE — Progress Notes (Signed)
HPI  This is a 67 year old female who is here today for followup visit. She has persistent atrial fibrillation . She is on anticoagulation with Xarelto without any reported side effects. In the past when she was on warfarin she had significant difficulty in maintaining therapeutic INR. 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 No coronary artery disease on previous cardiac catheterization. She presented in February of 2014 to [facility] with palpitations and reported heart rate of 130 beats per minute. She is usually very symptomatic when she goes into atrial fibrillation. She took a dose of diltiazem and by the time she arrived to the emergency room she was in sinus bradycardia.  She was seen by Dr. Dannielle Burn who repeated the echocardiogram and was concerned about possibility of severe mitral stenosis. A TEE was performed showed normal LV systolic function with moderately dilated left atrium, moderate to severe mitral stenosis with a calculated valve area of 1.3, severe tricuspid regurgitation with moderate pulmonary hypertension with an estimated systolic pulmonary pressure between 55-65 mm mercury. She was transferred to [facility] for consideration of mitral valve balloon valvuloplasty.  However, after performing trans-septal puncture, it was noted that the mitral valve gradient was only 6 mmHg even with pacing at 130 beats per minute. Thus, they did not proceed with valvuloplasty. This hemodynamic evaluation is consistent with the evaluation I performed in 2012 with a right and left cardiac catheterization.  There was plans of switching her from Xarelto to warfarin but the patient has a strong opinion against warfarin given the difficulty in the past in controlling her INR. Amiodarone was discontinued due to multiple reported side effects including  nausea, dizziness, balance problems, vertigo and right-sided hearing loss of unclear  etiology. I switched her back to Rythmol. She reports no significant episodes of atrial fibrillation since then. She has baseline bradycardia with heart rates running in the upper 40s and low 50s.  Allergies  Allergen Reactions  . Darvon   . Levofloxacin   . Nitrofuran Derivatives     (generic for Macrobid)  . Pentazocine Lactate   . Procaine Hcl   . Procaine Hcl   . Propoxyphene Hcl   . Septra [Sulfamethoxazole W/Trimethoprim (Co-Trimoxazole)]   . Clodronic Acid Rash  . Epinephrine Palpitations    Pt is unsure of this reaction  . Lidocaine Palpitations  . Propoxyphene Palpitations     Current Outpatient Prescriptions on File Prior to Visit  Medication Sig Dispense Refill  . acetaminophen (TYLENOL) 325 MG tablet 650 mg. Take 650 mg by mouth every 6 (six) hours as needed.      . AZOPT 1 % ophthalmic suspension Place 1 drop into both eyes Twice daily.      . furosemide (LASIX) 20 MG tablet TAKE 1 TABLET BY MOUTH DAILY  30 tablet  0  . meclizine (ANTIVERT) 25 MG tablet Take 25 mg by mouth as needed.       . metoprolol tartrate (LOPRESSOR) 25 MG tablet Take 12.5 mg by mouth daily. Take if heart rate is 50 or greater.      . nitroGLYCERIN (NITROSTAT) 0.4 MG SL tablet Place 1 tablet (0.4 mg total) under the tongue every 5 (five) minutes as needed.  25 tablet  6  . ondansetron (ZOFRAN) 8 MG tablet Take 8 mg by mouth as needed.       . propafenone (RYTHMOL) 225 MG tablet Take 1 tablet (225 mg total) by mouth every 8 (  eight) hours.  90 tablet  3  . Rivaroxaban (XARELTO) 20 MG TABS tablet Take 1 tablet (20 mg total) by mouth daily.  90 tablet  3   No current facility-administered medications on file prior to visit.     Past Medical History  Diagnosis Date  . Chest pain     normal coronary angiogram in december 2008  . Mild aortic regurgitation with left ventricular dilation by prior echocardiogram   . Atherosclerotic cerebrovascular disease     nonobstructive  . Breast mass, right     . Silicone leakage from breast implant   . Dyspnea     chronic exertional dyspnea related to intermittant atrial  . Carotid bruit     right   . Pneumonia   . Rheumatic fever     as a child  . Breast cancer     remote right breast mastectomy,bilaterial implants  . Breast cancer     radical mastectomy status post billaterial breast implants and right breast implat rupture  . Diabetes mellitus     diet controlled  . Mitral stenosis     moderate  . 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 Atrial fibrillation     post cardioversion maintain normal sinus rhythm  . Vertigo      Past Surgical History  Procedure Laterality Date  . Appendectomy    . Cholecystectomy    . Total abdominal hysterectomy    . Cardiac catheterization  2008    no significant CAD  . Cardiac catheterization  04/2011    No significant CAD, moderate mitral stenosis (mean gradient: 12 mm Hg, MVA: 1.83), moderate pulmonary hypertension (PVR: 2.4 Woods units), Normal LVEDP.      History reviewed. No pertinent family history.   History   Social History  . Marital Status: Married    Spouse Name: N/A    Number of Children: N/A  . Years of Education: N/A   Occupational History  .      lives in [place] History Main Topics  . Smoking status: Former Smoker -- 0.30 packs/day for 10 years    Types: Cigarettes    Quit date: 08/09/1983  . Smokeless tobacco: Never Used  . Alcohol Use: No  . Drug Use: No  . Sexual Activity: Not on file   Other Topics Concern  . Not on file   Social History Narrative  . No narrative on file     PHYSICAL EXAM   BP 128/64  Pulse 50  Ht 5' 8.5" (1.74 m)  Wt 215 lb 8 oz (97.75 kg)  BMI 32.29 kg/m2  Constitutional: She is oriented to person, place, and time. She appears well-developed and well-nourished. No distress.  HENT: No nasal discharge.  Head: Normocephalic and atraumatic.  Eyes: Pupils are equal, round, and  reactive to light. Right eye exhibits no discharge. Left eye exhibits no discharge.  Neck: Normal range of motion. Neck supple. No JVD present. No thyromegaly present.  Cardiovascular: Normal rate, regular rhythm, normal heart sounds. Exam reveals no gallop and no friction rub. There a 2/6 systolic ejection murmur at the base of the heart. Pulmonary/Chest: Effort normal and breath sounds normal. No stridor. No respiratory distress. She has no wheezes. She has no rales. She exhibits no tenderness.  Abdominal: Soft. Bowel sounds are normal. She exhibits no distension. There is no tenderness. There is no rebound and no guarding.  Musculoskeletal: Normal range  of motion. She exhibits no edema and no tenderness.  Neurological: She is alert and oriented to person, place, and time. Coordination normal.  Skin: Skin is warm and dry. No rash noted. She is not diaphoretic. No erythema. No pallor.  Psychiatric: She has a normal mood and affect. Her behavior is normal. Judgment and thought content normal.     [id number]  Bradycardia , normal QT interval WITHIN NORMAL LIMITS    ASSESSMENT AND PLAN

## 2013-07-01 NOTE — Patient Instructions (Signed)
Continue same medications.   Follow up for 4 months.

## 2013-07-02 NOTE — Assessment & Plan Note (Signed)
This was moderate by echocardiogram and cardiac catheterization. Followup with echocardiogram every 2 years.

## 2013-07-02 NOTE — Assessment & Plan Note (Signed)
She is maintaining normal sinus rhythm on Rythmol.  If she develops frequent recurrent atrial fibrillation, the plan is to refer her to [facility] for convergent ablation.  Continue anticoagulation with Xarelto. She understands that this is an off label use due to moderate mitral stenosis. However, she does not want to consider warfarin.

## 2013-08-21 ENCOUNTER — Telehealth: Payer: Self-pay | Admitting: *Deleted

## 2013-08-21 ENCOUNTER — Telehealth: Payer: Self-pay

## 2013-08-21 NOTE — Telephone Encounter (Signed)
Informed patient that per Dr. Fletcher Anon "That is fine. She should call us if heart rate goes below 40 or above 80. Some fluctuations are expected." Patient verbalized understanding. Patient instructed to call if heart rate sustains low or high or if she feels symptomatic or out of rhythm.

## 2013-08-21 NOTE — Telephone Encounter (Signed)
That is fine. She should call us if heart rate goes below 40 or above 80. Some fluctuations are expected.

## 2013-08-21 NOTE — Telephone Encounter (Signed)
Patient c/o HR fluctuating from 40s,50s to 60 with a BP of 115/89; patient not having any symptoms. Patient has taken her medications today. Please call patient with what to do.

## 2013-08-21 NOTE — Telephone Encounter (Signed)
Patient c/o HR fluctuating from 40s,50s to 60 with a BP of 115/89; patient not having any symptoms and feels "perfectly normal" . Patient has taken her medications today. She stated that she has fluctuations and drops to 50's but usually never to 40's.

## 2013-10-08 DIAGNOSIS — N644 Mastodynia: Secondary | ICD-10-CM | POA: Diagnosis not present

## 2013-10-08 DIAGNOSIS — Z853 Personal history of malignant neoplasm of breast: Secondary | ICD-10-CM | POA: Diagnosis not present

## 2013-10-08 DIAGNOSIS — R928 Other abnormal and inconclusive findings on diagnostic imaging of breast: Secondary | ICD-10-CM | POA: Diagnosis not present

## 2013-10-18 NOTE — Telephone Encounter (Signed)
Error

## 2013-10-23 ENCOUNTER — Other Ambulatory Visit: Payer: Self-pay | Admitting: *Deleted

## 2013-10-23 MED ORDER — FUROSEMIDE 20 MG PO TABS: ORAL_TABLET | ORAL | Status: DC

## 2013-10-23 NOTE — Telephone Encounter (Signed)
Requested Prescriptions   Signed Prescriptions Disp Refills  . furosemide (LASIX) 20 MG tablet 90 tablet 1    Sig: TAKE 1 TABLET BY MOUTH DAILY    Authorizing Provider: Kathlyn Sacramento A    Ordering User: Britt Bottom

## 2013-10-29 ENCOUNTER — Ambulatory Visit: Payer: Medicare Other | Admitting: Cardiovascular Disease

## 2013-11-02 DIAGNOSIS — J45901 Unspecified asthma with (acute) exacerbation: Secondary | ICD-10-CM | POA: Diagnosis not present

## 2013-11-02 DIAGNOSIS — R05 Cough: Secondary | ICD-10-CM | POA: Diagnosis not present

## 2013-11-02 DIAGNOSIS — R059 Cough, unspecified: Secondary | ICD-10-CM | POA: Diagnosis not present

## 2013-11-02 DIAGNOSIS — J019 Acute sinusitis, unspecified: Secondary | ICD-10-CM | POA: Diagnosis not present

## 2013-11-14 ENCOUNTER — Ambulatory Visit (INDEPENDENT_AMBULATORY_CARE_PROVIDER_SITE_OTHER): Payer: Medicare Other | Admitting: Cardiovascular Disease

## 2013-11-14 ENCOUNTER — Encounter: Payer: Self-pay | Admitting: Cardiovascular Disease

## 2013-11-14 VITALS — BP 138/78 | HR 51 | Ht 68.5 in | Wt 213.5 lb

## 2013-11-14 DIAGNOSIS — I272 Pulmonary hypertension, unspecified: Secondary | ICD-10-CM

## 2013-11-14 DIAGNOSIS — I050 Rheumatic mitral stenosis: Secondary | ICD-10-CM

## 2013-11-14 DIAGNOSIS — I2789 Other specified pulmonary heart diseases: Secondary | ICD-10-CM | POA: Diagnosis not present

## 2013-11-14 DIAGNOSIS — I4891 Unspecified atrial fibrillation: Secondary | ICD-10-CM | POA: Diagnosis not present

## 2013-11-14 NOTE — Progress Notes (Signed)
HPI  This is a 68 year old female who is here today for followup visit. She has persistent atrial fibrillation . She is on anticoagulation with Xarelto without any reported side effects. In the past when she was on warfarin she had significant difficulty in maintaining therapeutic INR. 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 No coronary artery disease on previous cardiac catheterization. She has been maintaining in sinus rhythm on Rythmol. She reports no significant episodes of atrial fibrillation since last visit. She has baseline bradycardia with heart rates running in the upper 40s and low 50s. No chest pain. She denies significant dyspnea or lower extremity edema.  Allergies  Allergen Reactions  . Darvon   . Levofloxacin   . Nitrofuran Derivatives     (generic for Macrobid)  . Pentazocine Lactate   . Procaine Hcl   . Procaine Hcl   . Propoxyphene Hcl   . Septra [Sulfamethoxazole W/Trimethoprim (Co-Trimoxazole)]   . Clodronic Acid Rash  . Epinephrine Palpitations    Pt is unsure of this reaction  . Lidocaine Palpitations  . Propoxyphene Palpitations     Current Outpatient Prescriptions on File Prior to Visit  Medication Sig Dispense Refill  . acetaminophen (TYLENOL) 325 MG tablet 650 mg. Take 650 mg by mouth every 6 (six) hours as needed.      . AZOPT 1 % ophthalmic suspension Place 1 drop into both eyes Twice daily.      . furosemide (LASIX) 20 MG tablet TAKE 1 TABLET BY MOUTH DAILY  90 tablet  1  . meclizine (ANTIVERT) 25 MG tablet Take 25 mg by mouth as needed.       . metoprolol tartrate (LOPRESSOR) 25 MG tablet Take 0.5 tablets (12.5 mg total) by mouth 2 (two) times daily. Take if heart rate is 50 or greater.  90 tablet  3  . nitroGLYCERIN (NITROSTAT) 0.4 MG SL tablet Place 1 tablet (0.4 mg total) under the tongue every 5 (five) minutes as needed.  25 tablet  6  . ondansetron (ZOFRAN) 8 MG tablet Take 8 mg by  mouth as needed.       . propafenone (RYTHMOL) 225 MG tablet Take 1 tablet (225 mg total) by mouth every 8 (eight) hours.  270 tablet  3  . Rivaroxaban (XARELTO) 20 MG TABS tablet Take 1 tablet (20 mg total) by mouth daily.  90 tablet  3   No current facility-administered medications on file prior to visit.     Past Medical History  Diagnosis Date  . Chest pain     normal coronary angiogram in december 2008  . Mild aortic regurgitation with left ventricular dilation by prior echocardiogram   . Atherosclerotic cerebrovascular disease     nonobstructive  . Breast mass, right   . Silicone leakage from breast implant   . Dyspnea     chronic exertional dyspnea related to intermittant atrial  . Carotid bruit     right   . Pneumonia   . Rheumatic fever     as a child  . Breast cancer     remote right breast mastectomy,bilaterial implants  . Breast cancer     radical mastectomy status post billaterial breast implants and right breast implat rupture  . Diabetes mellitus     diet controlled  . Mitral stenosis     moderate  . 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 Atrial fibrillation  post cardioversion maintain normal sinus rhythm  . Vertigo      Past Surgical History  Procedure Laterality Date  . Appendectomy    . Cholecystectomy    . Total abdominal hysterectomy    . Cardiac catheterization  2008    no significant CAD  . Cardiac catheterization  04/2011    No significant CAD, moderate mitral stenosis (mean gradient: 12 mm Hg, MVA: 1.83), moderate pulmonary hypertension (PVR: 2.4 Woods units), Normal LVEDP.      Family History  Problem Relation Age of Onset  . Family history unknown: Yes     History   Social History  . Marital Status: Married    Spouse Name: N/A    Number of Children: N/A  . Years of Education: N/A   Occupational History  .      lives in [place] History Main Topics  . Smoking status:  Former Smoker -- 0.30 packs/day for 10 years    Types: Cigarettes    Quit date: 08/09/1983  . Smokeless tobacco: Never Used  . Alcohol Use: No  . Drug Use: No  . Sexual Activity: Not on file   Other Topics Concern  . Not on file   Social History Narrative  . No narrative on file     PHYSICAL EXAM   BP 138/78  Pulse 51  Ht 5' 8.5" (1.74 m)  Wt 213 lb 8 oz (96.843 kg)  BMI 31.99 kg/m2  Constitutional: She is oriented to person, place, and time. She appears well-developed and well-nourished. No distress.  HENT: No nasal discharge.  Head: Normocephalic and atraumatic.  Eyes: Pupils are equal, round, and reactive to light. Right eye exhibits no discharge. Left eye exhibits no discharge.  Neck: Normal range of motion. Neck supple. No JVD present. No thyromegaly present.  Cardiovascular: Normal rate, regular rhythm, normal heart sounds. Exam reveals no gallop and no friction rub. There a 2/6 systolic ejection murmur at the base of the heart. Pulmonary/Chest: Effort normal and breath sounds normal. No stridor. No respiratory distress. She has no wheezes. She has no rales. She exhibits no tenderness.  Abdominal: Soft. Bowel sounds are normal. She exhibits no distension. There is no tenderness. There is no rebound and no guarding.  Musculoskeletal: Normal range of motion. She exhibits no edema and no tenderness.  Neurological: She is alert and oriented to person, place, and time. Coordination normal.  Skin: Skin is warm and dry. No rash noted. She is not diaphoretic. No erythema. No pallor.  Psychiatric: She has a normal mood and affect. Her behavior is normal. Judgment and thought content normal.     [id number]  Bradycardia , normal QT interval WITHIN NORMAL LIMITS    ASSESSMENT AND PLAN

## 2013-11-14 NOTE — Patient Instructions (Signed)
Continue same medications.   Your physician wants you to follow-up in: 6 months.  You will receive a reminder letter in the mail two months in advance. If you don't receive a letter, please call our office to schedule the follow-up appointment.  

## 2013-11-14 NOTE — Assessment & Plan Note (Signed)
She is maintaining normal sinus rhythm on Rythmol.  If she develops frequent recurrent atrial fibrillation, the plan is to refer her to [facility] for convergent ablation.  Continue anticoagulation with Xarelto. She understands that this is an off label use due to moderate mitral stenosis. However, she does not want to consider warfarin.

## 2013-11-14 NOTE — Assessment & Plan Note (Signed)
This has been moderate to severe and likely due to diastolic heart failure and moderate mitral stenosis. She does not appear to be significantly symptomatic at this time.

## 2013-11-14 NOTE — Assessment & Plan Note (Signed)
This was moderate on Most recent evaluation was in May of 2014. Given the associated pulmonary hypertension, I am planning on repeating echocardiogram in 6 months from now.

## 2014-01-13 DIAGNOSIS — H918X9 Other specified hearing loss, unspecified ear: Secondary | ICD-10-CM | POA: Diagnosis not present

## 2014-01-13 DIAGNOSIS — H8110 Benign paroxysmal vertigo, unspecified ear: Secondary | ICD-10-CM | POA: Diagnosis not present

## 2014-02-05 NOTE — Telephone Encounter (Signed)
This encounter was created in error - please disregard.

## 2014-02-13 ENCOUNTER — Other Ambulatory Visit: Payer: Self-pay | Admitting: Cardiovascular Disease

## 2014-02-21 ENCOUNTER — Telehealth: Payer: Self-pay

## 2014-02-21 DIAGNOSIS — R072 Precordial pain: Secondary | ICD-10-CM | POA: Diagnosis not present

## 2014-02-21 DIAGNOSIS — Z7901 Long term (current) use of anticoagulants: Secondary | ICD-10-CM | POA: Diagnosis not present

## 2014-02-21 DIAGNOSIS — R002 Palpitations: Secondary | ICD-10-CM | POA: Diagnosis not present

## 2014-02-21 DIAGNOSIS — R079 Chest pain, unspecified: Secondary | ICD-10-CM | POA: Diagnosis not present

## 2014-02-21 DIAGNOSIS — I4891 Unspecified atrial fibrillation: Secondary | ICD-10-CM | POA: Diagnosis not present

## 2014-02-21 DIAGNOSIS — Z8249 Family history of ischemic heart disease and other diseases of the circulatory system: Secondary | ICD-10-CM | POA: Diagnosis not present

## 2014-02-21 DIAGNOSIS — R0789 Other chest pain: Secondary | ICD-10-CM | POA: Diagnosis not present

## 2014-02-21 DIAGNOSIS — Z87891 Personal history of nicotine dependence: Secondary | ICD-10-CM | POA: Diagnosis not present

## 2014-02-21 DIAGNOSIS — Z9119 Patient's noncompliance with other medical treatment and regimen: Secondary | ICD-10-CM | POA: Diagnosis not present

## 2014-02-21 DIAGNOSIS — Z79899 Other long term (current) drug therapy: Secondary | ICD-10-CM | POA: Diagnosis not present

## 2014-02-21 DIAGNOSIS — Z853 Personal history of malignant neoplasm of breast: Secondary | ICD-10-CM | POA: Diagnosis not present

## 2014-02-21 DIAGNOSIS — I509 Heart failure, unspecified: Secondary | ICD-10-CM | POA: Diagnosis not present

## 2014-02-21 DIAGNOSIS — Z91199 Patient's noncompliance with other medical treatment and regimen due to unspecified reason: Secondary | ICD-10-CM | POA: Diagnosis not present

## 2014-02-21 NOTE — Telephone Encounter (Signed)
Pt called stating that she had CP last night w/ HR 117.  She states that she called the answering service and was advised to take Tylenol.  Reports that she did as instructed and laid down, but CP returned.  She went to the hospital in [place], dx w/ afib.  Was given cardizem 5mg , HR dropped to 40s, BP 63/30s.  She states that they were going to give her cardizem drip, but she was concerned that this would drop her vitals too much.  Pt was sched for cardioversion while inpt, but she left AMA, as she did not want procedure done there, she would like for this to be done by Dr. Fletcher Anon if he feels she needs this.  Pt's HR continues to be 49-52  Patient is afraid to take her metoprolol and propafenone  She wants to know when to hold it and when to take it   I asked patient if she could come in and be seen by Christell Faith PA  She stated that she could not but can come on Monday  I scheduled her with Thurmond Butts on Monday   Advised her to contact EMS if she became symptomatic or felt her situation became emergent

## 2014-02-21 NOTE — Telephone Encounter (Signed)
Dr. Fletcher Anon stated that he will call patient himself today

## 2014-02-21 NOTE — Telephone Encounter (Signed)
Pt called stating that she had CP last night w/ HR 117. She states that she called the answering service and was advised to take Tylenol. Reports that she did as instructed and laid down, but CP returned.  She went to the hospital in [place], dx w/ afib. Was given cardizem 5mg , HR dropped to 40s, BP 63/30s. She states that they were going to give her cardizem drip, but she was concerned that this would drop her vitals too much.  Pt was sched for cardioversion while inpt, but she left AMA, as she did not want procedure done there, she would like for this to be done by Dr. Fletcher Anon if he feels she needs this.  Pt's HA continues to be 49-52 and she would like to speak to Dr. Tyrell Antonio nurse as to whether she should take her am meds or if Dr. Fletcher Anon has any other suggestions for her.

## 2014-02-24 ENCOUNTER — Encounter: Payer: Self-pay | Admitting: Physician Assistant

## 2014-02-24 ENCOUNTER — Encounter (INDEPENDENT_AMBULATORY_CARE_PROVIDER_SITE_OTHER): Payer: Self-pay

## 2014-02-24 ENCOUNTER — Ambulatory Visit (INDEPENDENT_AMBULATORY_CARE_PROVIDER_SITE_OTHER): Payer: Medicare Other | Admitting: Physician Assistant

## 2014-02-24 VITALS — BP 132/70 | HR 56 | Ht 68.0 in | Wt 218.0 lb

## 2014-02-24 DIAGNOSIS — I498 Other specified cardiac arrhythmias: Secondary | ICD-10-CM | POA: Diagnosis not present

## 2014-02-24 DIAGNOSIS — R001 Bradycardia, unspecified: Secondary | ICD-10-CM

## 2014-02-24 DIAGNOSIS — I050 Rheumatic mitral stenosis: Secondary | ICD-10-CM

## 2014-02-24 DIAGNOSIS — I2789 Other specified pulmonary heart diseases: Secondary | ICD-10-CM | POA: Diagnosis not present

## 2014-02-24 DIAGNOSIS — R079 Chest pain, unspecified: Secondary | ICD-10-CM

## 2014-02-24 DIAGNOSIS — I4891 Unspecified atrial fibrillation: Secondary | ICD-10-CM | POA: Diagnosis not present

## 2014-02-24 DIAGNOSIS — I272 Pulmonary hypertension, unspecified: Secondary | ICD-10-CM

## 2014-02-24 MED ORDER — DILTIAZEM HCL 30 MG PO TABS: 30.0000 mg | ORAL_TABLET | Freq: Three times a day (TID) | ORAL | Status: DC

## 2014-02-24 MED ORDER — DILTIAZEM HCL 30 MG PO TABS: 30.0000 mg | ORAL_TABLET | ORAL | Status: DC | PRN

## 2014-02-24 MED ORDER — FUROSEMIDE 20 MG PO TABS: ORAL_TABLET | ORAL | Status: DC

## 2014-02-24 NOTE — Patient Instructions (Addendum)
Your physician has requested that you have an echocardiogram. Echocardiography is a painless test that uses sound waves to create images of your heart. It provides your doctor with information about the size and shape of your heart and how well your heart's chambers and valves are working. This procedure takes approximately one hour. There are no restrictions for this procedure.  Your physician has recommended that you wear a holter monitor. Holter monitors are medical devices that record the heart's electrical activity. Doctors most often use these monitors to diagnose arrhythmias. Arrhythmias are problems with the speed or rhythm of the heartbeat. The monitor is a small, portable device. You can wear one while you do your normal daily activities. This is usually used to diagnose what is causing palpitations/syncope (passing out).  [place]  Your caregiver has ordered a Stress Test with nuclear imaging. The purpose of this test is to evaluate the blood supply to your heart muscle. This procedure is referred to as a "Non-Invasive Stress Test." This is because other than having an IV started in your vein, nothing is inserted or "invades" your body. Cardiac stress tests are done to find areas of poor blood flow to the heart by determining the extent of coronary artery disease (CAD). Some patients exercise on a treadmill, which naturally increases the blood flow to your heart, while others who are  unable to walk on a treadmill due to physical limitations have a pharmacologic/chemical stress agent called Lexiscan . This medicine will mimic walking on a treadmill by temporarily increasing your coronary blood flow.   Please note: these test may take anywhere between 2-4 hours to complete  PLEASE REPORT TO [facility] AT THE FIRST DESK WILL DIRECT YOU WHERE TO GO  Date of Procedure:_________7/23/15____________________________  Arrival Time for Procedure:_______1015  am_______________________  Instructions regarding medication:    __x__:  Hold betablocker(s) night before procedure and morning of procedure: Metoprolol    PLEASE NOTIFY THE OFFICE AT LEAST 24 HOURS IN ADVANCE IF YOU ARE UNABLE TO KEEP YOUR APPOINTMENT.  [phone] AND  PLEASE NOTIFY NUCLEAR MEDICINE AT [facility] AT LEAST 24 HOURS IN ADVANCE IF YOU ARE UNABLE TO KEEP YOUR APPOINTMENT. [phone]  How to prepare for your Myoview test:  1. Do not eat or drink after midnight 2. No caffeine for 24 hours prior to test 3. No smoking 24 hours prior to test. 4. Your medication may be taken with water.  If your doctor stopped a medication because of this test, do not take that medication. 5. Ladies, please do not wear dresses.  Skirts or pants are appropriate. Please wear a short sleeve shirt. 6. No perfume, cologne or lotion. 7. Wear comfortable walking shoes. No heels!       Your physician has recommended you make the following change in your medication:  Take Cardizem 30 mg three times daily as needed for Afib Your lasix has been refilled   Your physician recommends that you schedule a follow-up appointment in:  1 month with Christell Faith PA

## 2014-02-24 NOTE — Progress Notes (Signed)
[place], [place] [address]  Date:  02/24/2014   Patient ID:  Taylor Weber, Taylor Weber [birth year], MRN [id number]   PCP:  Jeri Modena  Cardiologist:  Dr. Fletcher Anon, MD      Patient Profile: 68 y.o. female with history of persistent a fib s/p cardioversion x 2, most recently 2013 at [facility] presents with complaint of irregular heart rate and blood pressure on 02/20/2014.     Problem List:  Past Medical History  Diagnosis Date  . Chest pain     normal coronary angiogram in december 2008  . Mild aortic regurgitation with left ventricular dilation by prior echocardiogram   . Atherosclerotic cerebrovascular disease     nonobstructive  . Breast mass, right   . Silicone leakage from breast implant   . Dyspnea     chronic exertional dyspnea related to intermittant atrial  . Carotid bruit     right   . Pneumonia   . Rheumatic fever     as a child  . Breast cancer     remote right breast mastectomy,bilaterial implants  . Breast cancer     radical mastectomy status post billaterial breast implants and right breast implat rupture  . Diabetes mellitus     diet controlled  . Mitral stenosis     moderate  . 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 Atrial fibrillation     post cardioversion maintain normal sinus rhythm  . Vertigo    Past Surgical History  Procedure Laterality Date  . Appendectomy    . Cholecystectomy    . Total abdominal hysterectomy    . Cardiac catheterization  2008    no significant CAD  . Cardiac catheterization  04/2011    No significant CAD, moderate mitral stenosis (mean gradient: 12 mm Hg, MVA: 1.83), moderate pulmonary hypertension (PVR: 2.4 Woods units), Normal LVEDP.      Allergies:  Allergies  Allergen Reactions  . Amoxicillin-Pot Clavulanate     Other reaction(s): Other (See Comments) Other Reaction: GI Upset  . Darvon   . Levofloxacin   . Nitrofuran Derivatives     (generic for  Macrobid)  . Pentazocine Lactate   . Procaine Hcl   . Procaine Hcl   . Propoxyphene Hcl   . Septra [Sulfamethoxazole W/Trimethoprim (Co-Trimoxazole)]   . Clodronic Acid Rash  . Epinephrine Palpitations    Pt is unsure of this reaction  . Lidocaine Palpitations  . Propoxyphene Palpitations     History of Present Illness: 68 y.o. female with the above problem list including persistent a fib s/p cardioversion presents with complaint of irregular heart rate on 02/20/2014.  Patient with persistent atrial fib s/p cardioversion x 2, most recently 2013 at [place]. Currently taking Rhythmol 225 q 8 hours, metoprolol 12.5 mg bid, and xarelto 20 mg with supper. Has previously taken amiodarone, cardizem  Patient initially presented to [facility] with elevated HR of 117-120. Reportedly was found to be in a fib at that time and received cardizem 5 mg IV. She reports declining cardizem drip at [facility]. Patient reports troponin negative x 2. There is some question of if she was discharged or is she left AMA.   She reports the police had just been to her house prior to the onset of the above episode to discuss the case of her her two missing grandchildren and that caused a lot of anxiety/stress.   She reports with this above  episode she did have some retrosternal chest pain, which was new for her with her a fib. She notes on occasion she can walk into the kitchen and develop this retrosternal chest pain as well. It lasts several minutes, then self resolves. It comes on at rest at times as well. No associated nausea, vomiting, or diaphoresis. Previous heart caths 2008 and 2012 without occlusion.    She felt fine over the weekend with blood pressures in the 120's/70's. Pulse regular and in the 50's to 60's although she does report at times it will drop to the 40's, preventing her from taking her metoprolol.   Last echo on 12/19/2012 indicated 55-60%, no diagnostic WMA (possibility cannot be excluded  on this study), grade 2 DD, mild aortic regurgitation without stenosis, mitral valve moderately thickened (especially towards the tips), mildly calcified leaflets, findings c/w moderate stenosis (likely from rheumatic fever during childhood).   She has been seen by [facility] for consideration of mitral valve balloon valvuloplasty, but was noted this was unnecessary with a gradient of 6 mmHg with pacing at 130 BPM.     Home Medications:  Prior to Admission medications   Medication Sig Start Date End Date Taking? Authorizing Provider  acetaminophen (TYLENOL) 325 MG tablet 650 mg. Take 650 mg by mouth every 6 (six) hours as needed.    Historical Provider, MD  AZOPT 1 % ophthalmic suspension Place 1 drop into both eyes Twice daily. 05/24/11   Historical Provider, MD  furosemide (LASIX) 20 MG tablet TAKE 1 TABLET BY MOUTH DAILY 10/23/13   Wellington Hampshire, MD  furosemide (LASIX) 20 MG tablet TAKE 1 TABLET BY MOUTH DAILY 02/13/14   Wellington Hampshire, MD  meclizine (ANTIVERT) 25 MG tablet Take 25 mg by mouth as needed.  02/16/13   Historical Provider, MD  metoprolol tartrate (LOPRESSOR) 25 MG tablet Take 0.5 tablets (12.5 mg total) by mouth 2 (two) times daily. Take if heart rate is 50 or greater. 07/01/13   Wellington Hampshire, MD  nitroGLYCERIN (NITROSTAT) 0.4 MG SL tablet Place 1 tablet (0.4 mg total) under the tongue every 5 (five) minutes as needed. 03/12/12   Donney Dice, PA-C  ondansetron (ZOFRAN) 8 MG tablet Take 8 mg by mouth as needed.  02/16/13   Historical Provider, MD  propafenone (RYTHMOL) 225 MG tablet Take 1 tablet (225 mg total) by mouth every 8 (eight) hours. 07/01/13   Wellington Hampshire, MD  Rivaroxaban (XARELTO) 20 MG TABS tablet Take 1 tablet (20 mg total) by mouth daily. 07/01/13   Wellington Hampshire, MD     Family History:  The patient's Family history is unknown by patient.    Social History:  The patient  reports that she quit smoking about 30 years ago. Her smoking use included  Cigarettes. She has a 3 pack-year smoking history. She has never used smokeless tobacco. She reports that she does not drink alcohol or use illicit drugs.   Recent Labs: No results found for requested labs within last 365 days.   Weights: Filed Weights   02/24/14 1358  Weight: 218 lb (98.884 kg)       ROS:  Please see the history of present illness. All other systems reviewed and negative.    PHYSICAL EXAM:  VS:  BP 132/70  Pulse 56  Ht 5\' 8"  (1.727 m)  Wt 218 lb (98.884 kg)  BMI 33.15 kg/m2 Well nourished, well developed, in no acute distress HEENT: normal Neck: no JVD Cardiac:  normal S1, S2; RRR; 2/6 systolic ejection murmur at the base of the heart Lungs:  clear to auscultation bilaterally, no wheezing, rhonchi or rales Abd: soft, nontender, no hepatomegaly Ext: no edema Skin: warm and dry Neuro:  moves all extremities spontaneously, no focal abnormalities noted  EKG:  Sinus bradycardia, 56, marked sinus arrhythmia   ASSESSMENT AND PLAN:  1. Atrial fibrillation: Status post cardioversion x 2, several years ago. Back in sinus rhythm (bradycardic) on rythmol 225 q 8 hours, and metoprolol 12.5 mg bid. This is possibly her first episode of a fib (awaiting records from [place]) in years she reports. She would like to try a low dose of cardizem 30 mg prn if she goes into a fib again prior to going to [facility] for convergent ablation. Discussed with patient risks of this medication. She agrees to these.        2. Chest pain: These is a new symptom for her to have with her a fib. However, she has also had some exertional and non-exertional chest pain. EKG without any acute st/t changes today. Previously with nonobstructive disease on cath in 2008 and 2012. Will go ahead and set her up for a Lexiscan.   3. Sinus arrhythmia/bradycardia: She has previous EKGs on file with bradycardia and mild sinus arrythmia, however none to this extent. Given her new symptoms it is reasonable to move  forward with a Holter monitor.   4. Mitral stenosis: She is due for recheck in 3 months from now. However, will get a new study at this time 2/2 her recent exacerbation.   5. Pulmonary hypertension: Stable.   6. Dispo: Follow up 1 month   Christell Faith, MHS, PA-C [place], [place] [address] [phone]

## 2014-02-25 DIAGNOSIS — H2589 Other age-related cataract: Secondary | ICD-10-CM | POA: Diagnosis not present

## 2014-02-25 DIAGNOSIS — H5231 Anisometropia: Secondary | ICD-10-CM | POA: Diagnosis not present

## 2014-02-25 DIAGNOSIS — H01009 Unspecified blepharitis unspecified eye, unspecified eyelid: Secondary | ICD-10-CM | POA: Diagnosis not present

## 2014-02-25 DIAGNOSIS — H18519 Endothelial corneal dystrophy, unspecified eye: Secondary | ICD-10-CM | POA: Diagnosis not present

## 2014-02-27 ENCOUNTER — Ambulatory Visit: Payer: Self-pay | Admitting: Cardiovascular Disease

## 2014-02-27 ENCOUNTER — Other Ambulatory Visit (INDEPENDENT_AMBULATORY_CARE_PROVIDER_SITE_OTHER): Payer: Medicare Other

## 2014-02-27 ENCOUNTER — Other Ambulatory Visit: Payer: Self-pay

## 2014-02-27 DIAGNOSIS — I272 Pulmonary hypertension, unspecified: Secondary | ICD-10-CM

## 2014-02-27 DIAGNOSIS — I4891 Unspecified atrial fibrillation: Secondary | ICD-10-CM | POA: Diagnosis not present

## 2014-02-27 DIAGNOSIS — I059 Rheumatic mitral valve disease, unspecified: Secondary | ICD-10-CM | POA: Diagnosis not present

## 2014-02-27 DIAGNOSIS — R001 Bradycardia, unspecified: Secondary | ICD-10-CM

## 2014-02-27 DIAGNOSIS — R079 Chest pain, unspecified: Secondary | ICD-10-CM | POA: Diagnosis not present

## 2014-02-27 DIAGNOSIS — I999 Unspecified disorder of circulatory system: Secondary | ICD-10-CM | POA: Diagnosis not present

## 2014-02-27 DIAGNOSIS — I259 Chronic ischemic heart disease, unspecified: Secondary | ICD-10-CM | POA: Diagnosis not present

## 2014-02-28 ENCOUNTER — Encounter: Payer: Self-pay | Admitting: Cardiovascular Disease

## 2014-02-28 ENCOUNTER — Other Ambulatory Visit: Payer: Self-pay

## 2014-02-28 DIAGNOSIS — R001 Bradycardia, unspecified: Secondary | ICD-10-CM

## 2014-02-28 DIAGNOSIS — R079 Chest pain, unspecified: Secondary | ICD-10-CM

## 2014-02-28 DIAGNOSIS — I272 Pulmonary hypertension, unspecified: Secondary | ICD-10-CM

## 2014-02-28 DIAGNOSIS — I4891 Unspecified atrial fibrillation: Secondary | ICD-10-CM

## 2014-03-13 ENCOUNTER — Telehealth: Payer: Self-pay

## 2014-03-13 NOTE — Telephone Encounter (Signed)
Pt called, states she has still not received her monitor. Pt ok to leave detailed msg on home or cell #.

## 2014-03-14 NOTE — Telephone Encounter (Signed)
LMTCB

## 2014-03-14 NOTE — Telephone Encounter (Signed)
Spoke with patient regarding holter montior; patient is scheduled for Monday, 03/17/2014 in [place] but would like to go have monitor placed in the [place] office. Gabriel Cirri is calling to have patient's monitor placed in the [place] office.

## 2014-03-21 DIAGNOSIS — H2510 Age-related nuclear cataract, unspecified eye: Secondary | ICD-10-CM | POA: Diagnosis not present

## 2014-03-24 ENCOUNTER — Encounter (INDEPENDENT_AMBULATORY_CARE_PROVIDER_SITE_OTHER): Payer: Medicare Other

## 2014-03-24 ENCOUNTER — Encounter: Payer: Self-pay | Admitting: *Deleted

## 2014-03-24 DIAGNOSIS — I4891 Unspecified atrial fibrillation: Secondary | ICD-10-CM | POA: Diagnosis not present

## 2014-03-24 DIAGNOSIS — I498 Other specified cardiac arrhythmias: Secondary | ICD-10-CM

## 2014-03-24 DIAGNOSIS — R079 Chest pain, unspecified: Secondary | ICD-10-CM

## 2014-03-24 DIAGNOSIS — I272 Pulmonary hypertension, unspecified: Secondary | ICD-10-CM

## 2014-03-24 DIAGNOSIS — R001 Bradycardia, unspecified: Secondary | ICD-10-CM

## 2014-03-24 NOTE — Progress Notes (Signed)
Patient ID: Taylor Weber, female   DOB: 08/23/1945, 68 y.o.   MRN: [id number] E-Cardio 48 hour holter monitor applied to patient.

## 2014-04-01 ENCOUNTER — Ambulatory Visit: Payer: Medicare Other | Admitting: Cardiovascular Disease

## 2014-04-03 ENCOUNTER — Ambulatory Visit: Payer: Medicare Other | Admitting: Cardiovascular Disease

## 2014-04-07 ENCOUNTER — Other Ambulatory Visit: Payer: Self-pay | Admitting: Cardiovascular Disease

## 2014-04-07 NOTE — Telephone Encounter (Signed)
This encounter was created in error - please disregard.

## 2014-04-15 ENCOUNTER — Ambulatory Visit: Payer: Medicare Other | Admitting: Cardiovascular Disease

## 2014-04-15 ENCOUNTER — Other Ambulatory Visit: Payer: Self-pay | Admitting: Cardiovascular Disease

## 2014-04-18 DIAGNOSIS — R1084 Generalized abdominal pain: Secondary | ICD-10-CM | POA: Diagnosis not present

## 2014-04-18 DIAGNOSIS — R110 Nausea: Secondary | ICD-10-CM | POA: Diagnosis not present

## 2014-04-29 ENCOUNTER — Ambulatory Visit: Payer: Medicare Other | Admitting: Cardiovascular Disease

## 2014-05-08 ENCOUNTER — Ambulatory Visit: Payer: Medicare Other | Admitting: Cardiovascular Disease

## 2014-05-10 DIAGNOSIS — R110 Nausea: Secondary | ICD-10-CM | POA: Diagnosis not present

## 2014-05-10 DIAGNOSIS — I480 Paroxysmal atrial fibrillation: Secondary | ICD-10-CM | POA: Diagnosis not present

## 2014-05-10 DIAGNOSIS — J40 Bronchitis, not specified as acute or chronic: Secondary | ICD-10-CM | POA: Diagnosis not present

## 2014-05-16 ENCOUNTER — Telehealth: Payer: Self-pay | Admitting: *Deleted

## 2014-05-16 ENCOUNTER — Encounter: Payer: Self-pay | Admitting: *Deleted

## 2014-05-16 ENCOUNTER — Encounter: Payer: Self-pay | Admitting: Cardiovascular Disease

## 2014-05-16 ENCOUNTER — Ambulatory Visit (INDEPENDENT_AMBULATORY_CARE_PROVIDER_SITE_OTHER): Payer: Medicare Other | Admitting: Cardiovascular Disease

## 2014-05-16 ENCOUNTER — Ambulatory Visit: Payer: Self-pay | Admitting: Cardiovascular Disease

## 2014-05-16 VITALS — BP 110/82 | HR 92 | Ht 68.5 in | Wt 215.0 lb

## 2014-05-16 DIAGNOSIS — I4891 Unspecified atrial fibrillation: Secondary | ICD-10-CM

## 2014-05-16 DIAGNOSIS — I050 Rheumatic mitral stenosis: Secondary | ICD-10-CM | POA: Diagnosis not present

## 2014-05-16 LAB — CBC WITH DIFFERENTIAL/PLATELET
BASOS PCT: 0.9 %
Basophil #: 0.1 10*3/uL (ref 0.0–0.1)
Eosinophil #: 0.4 10*3/uL (ref 0.0–0.7)
Eosinophil %: 4.8 %
HCT: 42.8 % (ref 35.0–47.0)
HGB: 14 g/dL (ref 12.0–16.0)
LYMPHS ABS: 2.3 10*3/uL (ref 1.0–3.6)
Lymphocyte %: 28.8 %
MCH: 29.2 pg (ref 26.0–34.0)
MCHC: 32.8 g/dL (ref 32.0–36.0)
MCV: 89 fL (ref 80–100)
MONOS PCT: 9 %
Monocyte #: 0.7 x10 3/mm (ref 0.2–0.9)
Neutrophil #: 4.6 10*3/uL (ref 1.4–6.5)
Neutrophil %: 56.5 %
Platelet: 195 10*3/uL (ref 150–440)
RBC: 4.8 10*6/uL (ref 3.80–5.20)
RDW: 13.6 % (ref 11.5–14.5)
WBC: 8.1 10*3/uL (ref 3.6–11.0)

## 2014-05-16 LAB — BASIC METABOLIC PANEL
ANION GAP: 9 (ref 7–16)
BUN: 15 mg/dL (ref 7–18)
CHLORIDE: 104 mmol/L (ref 98–107)
Calcium, Total: 9 mg/dL (ref 8.5–10.1)
Co2: 28 mmol/L (ref 21–32)
Creatinine: 1.02 mg/dL (ref 0.60–1.30)
EGFR (African American): >60
GFR CALC NON AF AMER: 57 — AB
Glucose: 97 mg/dL (ref 65–99)
OSMOLALITY: 282 (ref 275–301)
Potassium: 3.9 mmol/L (ref 3.5–5.1)
Sodium: 141 mmol/L (ref 136–145)

## 2014-05-16 LAB — PROTIME-INR
INR: 1.6
PROTHROMBIN TIME: 18.5 s — AB (ref 11.5–14.7)

## 2014-05-16 NOTE — Telephone Encounter (Signed)
Cardioversion faxed to [facility]

## 2014-05-16 NOTE — Patient Instructions (Signed)
Your physician has recommended that you have a Cardioversion (DCCV). Electrical Cardioversion uses a jolt of electricity to your heart either through paddles or wired patches attached to your chest. This is a controlled, usually prescheduled, procedure. Defibrillation is done under light anesthesia in the hospital, and you usually go home the day of the procedure. This is done to get your heart back into a normal rhythm. You are not awake for the procedure. Please see the instruction sheet given to you today.  Your physician recommends that you have labs today:  BMP CBC INR

## 2014-05-16 NOTE — Assessment & Plan Note (Signed)
The patient is currently back in atrial fibrillation likely a few days duration. She is usually very symptomatic during these episodes. I discussed different management options including attempted cardioversion versus focusing on rate control may be with digoxin given that she tends to have relatively low blood pressure. Given previous difficulty in symptoms when she was in A. fib, I favor proceeding with cardioversion. She has been anticoagulated with Xarelto long-term. Risks and benefits were explained. We'll proceed on Monday.

## 2014-05-16 NOTE — Progress Notes (Signed)
HPI  This is a 68 year old female who is here today for followup visit. She has persistent atrial fibrillation . She is on anticoagulation with Xarelto without any reported side effects. In the past when she was on warfarin she had significant difficulty in maintaining therapeutic INR. 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 No coronary artery disease on previous cardiac catheterization. She has been maintaining in sinus rhythm on Rythmol. She reports no significant episodes of atrial fibrillation since last visit. She has baseline bradycardia with heart rates running in the upper 40s and low 50s. She has been on multiple antiarrhythmic medications in the past. She is usually aware when she goes into atrial fibrillation. She noted that she was out for them few days ago. She complains of increased dyspnea and palpitations.  Allergies  Allergen Reactions  . Amoxicillin-Pot Clavulanate     Other reaction(s): Other (See Comments) Other Reaction: GI Upset  . Darvon   . Levofloxacin   . Nitrofuran Derivatives     (generic for Macrobid)  . Pentazocine Lactate   . Procaine Hcl   . Procaine Hcl   . Propoxyphene Hcl   . Septra [Sulfamethoxazole W/Trimethoprim (Co-Trimoxazole)]   . Clodronic Acid Rash  . Epinephrine Palpitations    Pt is unsure of this reaction  . Lidocaine Palpitations  . Propoxyphene Palpitations     Current Outpatient Prescriptions on File Prior to Visit  Medication Sig Dispense Refill  . acetaminophen (TYLENOL) 325 MG tablet 650 mg. Take 650 mg by mouth every 6 (six) hours as needed.      . AZOPT 1 % ophthalmic suspension Place 1 drop into both eyes Twice daily.      . furosemide (LASIX) 20 MG tablet TAKE 1 TABLET BY MOUTH DAILY  90 tablet  4  . meclizine (ANTIVERT) 25 MG tablet Take 25 mg by mouth as needed.       . metoprolol tartrate (LOPRESSOR) 25 MG tablet Take 0.5 tablets (12.5 mg total) by mouth 2 (two)  times daily. Take if heart rate is 50 or greater.  90 tablet  3  . nitroGLYCERIN (NITROSTAT) 0.4 MG SL tablet Place 1 tablet (0.4 mg total) under the tongue every 5 (five) minutes as needed.  25 tablet  6  . ondansetron (ZOFRAN) 8 MG tablet Take 8 mg by mouth as needed.       . propafenone (RYTHMOL) 225 MG tablet TAKE 1 TABLET BY MOUTH BY MOUTH EVERY 8 HOURS  270 tablet  3  . XARELTO 20 MG TABS tablet TAKE 1 TABLET BY MOUTH DAILY  90 tablet  0   No current facility-administered medications on file prior to visit.     Past Medical History  Diagnosis Date  . Chest pain     normal coronary angiogram in december 2008  . Mild aortic regurgitation with left ventricular dilation by prior echocardiogram   . Atherosclerotic cerebrovascular disease     nonobstructive  . Breast mass, right   . Silicone leakage from breast implant   . Dyspnea     chronic exertional dyspnea related to intermittant atrial  . Carotid bruit     right   . Pneumonia   . Rheumatic fever     as a child  . Breast cancer     remote right breast mastectomy,bilaterial implants  . Breast cancer     radical mastectomy status post billaterial breast implants and right breast implat rupture  .  Diabetes mellitus     diet controlled  . Mitral stenosis     moderate  . 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 Atrial fibrillation     post cardioversion maintain normal sinus rhythm  . Vertigo      Past Surgical History  Procedure Laterality Date  . Appendectomy    . Cholecystectomy    . Total abdominal hysterectomy    . Cardiac catheterization  2008    no significant CAD  . Cardiac catheterization  04/2011    No significant CAD, moderate mitral stenosis (mean gradient: 12 mm Hg, MVA: 1.83), moderate pulmonary hypertension (PVR: 2.4 Woods units), Normal LVEDP.      History reviewed. No pertinent family history.   History   Social History  . Marital Status: Married    Spouse Name:  N/A    Number of Children: N/A  . Years of Education: N/A   Occupational History  .      lives in [place] History Main Topics  . Smoking status: Former Smoker -- 0.30 packs/day for 10 years    Types: Cigarettes    Quit date: 08/09/1983  . Smokeless tobacco: Never Used  . Alcohol Use: No  . Drug Use: No  . Sexual Activity: Not on file   Other Topics Concern  . Not on file   Social History Narrative  . No narrative on file     PHYSICAL EXAM   BP 110/82  Pulse 92  Ht 5' 8.5" (1.74 m)  Wt 215 lb (97.523 kg)  BMI 32.21 kg/m2  Constitutional: She is oriented to person, place, and time. She appears well-developed and well-nourished. No distress.  HENT: No nasal discharge.  Head: Normocephalic and atraumatic.  Eyes: Pupils are equal, round, and reactive to light. Right eye exhibits no discharge. Left eye exhibits no discharge.  Neck: Normal range of motion. Neck supple. No JVD present. No thyromegaly present.  Cardiovascular: Normal rate, irregular rhythm, normal heart sounds. Exam reveals no gallop and no friction rub. There a 2/6 systolic ejection murmur at the base of the heart. Pulmonary/Chest: Effort normal and breath sounds normal. No stridor. No respiratory distress. She has no wheezes. She has no rales. She exhibits no tenderness.  Abdominal: Soft. Bowel sounds are normal. She exhibits no distension. There is no tenderness. There is no rebound and no guarding.  Musculoskeletal: Normal range of motion. She exhibits no edema and no tenderness.  Neurological: She is alert and oriented to person, place, and time. Coordination normal.  Skin: Skin is warm and dry. No rash noted. She is not diaphoretic. No erythema. No pallor.  Psychiatric: She has a normal mood and affect. Her behavior is normal. Judgment and thought content normal.     EKG: Possible atrial fibrillation  -Nonspecific QRS widening and anterior fascicular block.   ABNORMAL     ASSESSMENT AND PLAN

## 2014-05-16 NOTE — Assessment & Plan Note (Signed)
Most recent echo in July of 2015 showed mild to moderate mitral stenosis with a valve area of 1.52 and pulmonary pressure of 45 mm Hg.

## 2014-05-19 ENCOUNTER — Ambulatory Visit: Payer: Self-pay | Admitting: Cardiovascular Disease

## 2014-05-19 ENCOUNTER — Encounter: Payer: Self-pay | Admitting: Cardiovascular Disease

## 2014-05-19 DIAGNOSIS — I272 Other secondary pulmonary hypertension: Secondary | ICD-10-CM | POA: Diagnosis not present

## 2014-05-19 DIAGNOSIS — Z8701 Personal history of pneumonia (recurrent): Secondary | ICD-10-CM | POA: Diagnosis not present

## 2014-05-19 DIAGNOSIS — Z9882 Breast implant status: Secondary | ICD-10-CM | POA: Diagnosis not present

## 2014-05-19 DIAGNOSIS — Z7901 Long term (current) use of anticoagulants: Secondary | ICD-10-CM | POA: Diagnosis not present

## 2014-05-19 DIAGNOSIS — I2510 Atherosclerotic heart disease of native coronary artery without angina pectoris: Secondary | ICD-10-CM | POA: Diagnosis not present

## 2014-05-19 DIAGNOSIS — I050 Rheumatic mitral stenosis: Secondary | ICD-10-CM | POA: Diagnosis not present

## 2014-05-19 DIAGNOSIS — Z9011 Acquired absence of right breast and nipple: Secondary | ICD-10-CM | POA: Diagnosis not present

## 2014-05-19 DIAGNOSIS — Z87891 Personal history of nicotine dependence: Secondary | ICD-10-CM | POA: Diagnosis not present

## 2014-05-19 DIAGNOSIS — E119 Type 2 diabetes mellitus without complications: Secondary | ICD-10-CM | POA: Diagnosis not present

## 2014-05-19 DIAGNOSIS — R0609 Other forms of dyspnea: Secondary | ICD-10-CM | POA: Diagnosis not present

## 2014-05-19 DIAGNOSIS — Z79899 Other long term (current) drug therapy: Secondary | ICD-10-CM | POA: Diagnosis not present

## 2014-05-19 DIAGNOSIS — I481 Persistent atrial fibrillation: Secondary | ICD-10-CM | POA: Diagnosis not present

## 2014-05-19 DIAGNOSIS — I4891 Unspecified atrial fibrillation: Secondary | ICD-10-CM

## 2014-05-19 DIAGNOSIS — Z853 Personal history of malignant neoplasm of breast: Secondary | ICD-10-CM | POA: Diagnosis not present

## 2014-06-02 ENCOUNTER — Encounter: Payer: Self-pay | Admitting: Cardiovascular Disease

## 2014-06-02 ENCOUNTER — Ambulatory Visit (INDEPENDENT_AMBULATORY_CARE_PROVIDER_SITE_OTHER): Payer: Medicare Other | Admitting: Cardiovascular Disease

## 2014-06-02 VITALS — BP 128/77 | HR 44 | Ht 68.0 in | Wt 215.5 lb

## 2014-06-02 DIAGNOSIS — I270 Primary pulmonary hypertension: Secondary | ICD-10-CM

## 2014-06-02 DIAGNOSIS — I4891 Unspecified atrial fibrillation: Secondary | ICD-10-CM

## 2014-06-02 DIAGNOSIS — I050 Rheumatic mitral stenosis: Secondary | ICD-10-CM | POA: Diagnosis not present

## 2014-06-02 DIAGNOSIS — I272 Pulmonary hypertension, unspecified: Secondary | ICD-10-CM

## 2014-06-02 NOTE — Progress Notes (Signed)
HPI  This is a 68 year old female who is here today for followup visit. She has persistent atrial fibrillation . She is on anticoagulation with Xarelto without any reported side effects. In the past when she was on warfarin she had significant difficulty in maintaining therapeutic INR. 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 No coronary artery disease on previous cardiac catheterization. Most recent echocardiogram in July 2015 showed normal LV systolic function, mild to moderate mitral stenosis with a valve area of 1.52, mildly dilated left atrium and mild pulmonary hypertension with a peak systolic artery pressure 45 mm Hg.  She has been maintaining in sinus rhythm on Rythmol. She was seen recently for symptomatic atrial fibrillation. I proceeded with cardioversion with a deceleration of normal sinus rhythm. She reports feeling better. She has chronic sinus bradycardia which has been relatively stable.  Allergies  Allergen Reactions  . Amoxicillin-Pot Clavulanate     Other reaction(s): Other (See Comments) Other Reaction: GI Upset  . Darvon   . Levofloxacin   . Nitrofuran Derivatives     (generic for Macrobid)  . Pentazocine Lactate   . Procaine Hcl   . Procaine Hcl   . Propoxyphene Hcl   . Septra [Sulfamethoxazole W/Trimethoprim (Co-Trimoxazole)]   . Clodronic Acid Rash  . Epinephrine Palpitations    Pt is unsure of this reaction  . Lidocaine Palpitations  . Propoxyphene Palpitations     Current Outpatient Prescriptions on File Prior to Visit  Medication Sig Dispense Refill  . acetaminophen (TYLENOL) 325 MG tablet 650 mg. Take 650 mg by mouth every 6 (six) hours as needed.      . AZOPT 1 % ophthalmic suspension Place 1 drop into both eyes Twice daily.      . furosemide (LASIX) 20 MG tablet TAKE 1 TABLET BY MOUTH DAILY  90 tablet  4  . meclizine (ANTIVERT) 25 MG tablet Take 25  mg by mouth as needed.       . metoprolol tartrate (LOPRESSOR) 25 MG tablet Take 0.5 tablets (12.5 mg total) by mouth 2 (two) times daily. Take if heart rate is 50 or greater.  90 tablet  3  . nitroGLYCERIN (NITROSTAT) 0.4 MG SL tablet Place 1 tablet (0.4 mg total) under the tongue every 5 (five) minutes as needed.  25 tablet  6  . ondansetron (ZOFRAN) 8 MG tablet Take 8 mg by mouth as needed.       . propafenone (RYTHMOL) 225 MG tablet TAKE 1 TABLET BY MOUTH BY MOUTH EVERY 8 HOURS  270 tablet  3  . XARELTO 20 MG TABS tablet TAKE 1 TABLET BY MOUTH DAILY  90 tablet  0   No current facility-administered medications on file prior to visit.     Past Medical History  Diagnosis Date  . Chest pain     normal coronary angiogram in december 2008  . Mild aortic regurgitation with left ventricular dilation by prior echocardiogram   . Atherosclerotic cerebrovascular disease     nonobstructive  . Breast mass, right   . Silicone leakage from breast implant   . Dyspnea     chronic exertional dyspnea related to intermittant atrial  . Carotid bruit     right   . Pneumonia   . Rheumatic fever     as a child  . Breast cancer     remote right breast mastectomy,bilaterial implants  . Breast cancer  radical mastectomy status post billaterial breast implants and right breast implat rupture  . Diabetes mellitus     diet controlled  . Mitral stenosis     moderate  . 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 Atrial fibrillation     post cardioversion maintain normal sinus rhythm  . Vertigo      Past Surgical History  Procedure Laterality Date  . Appendectomy    . Cholecystectomy    . Total abdominal hysterectomy    . Cardiac catheterization  2008    no significant CAD  . Cardiac catheterization  04/2011    No significant CAD, moderate mitral stenosis (mean gradient: 12 mm Hg, MVA: 1.83), moderate pulmonary hypertension (PVR: 2.4 Woods units), Normal LVEDP.       History reviewed. No pertinent family history.   History   Social History  . Marital Status: Married    Spouse Name: N/A    Number of Children: N/A  . Years of Education: N/A   Occupational History  .      lives in [place] History Main Topics  . Smoking status: Former Smoker -- 0.30 packs/day for 10 years    Types: Cigarettes    Quit date: 08/09/1983  . Smokeless tobacco: Never Used  . Alcohol Use: No  . Drug Use: No  . Sexual Activity: Not on file   Other Topics Concern  . Not on file   Social History Narrative  . No narrative on file     PHYSICAL EXAM   BP 128/77  Pulse 44  Ht 5\' 8"  (1.727 m)  Wt 215 lb 8 oz (97.75 kg)  BMI 32.77 kg/m2  Constitutional: She is oriented to person, place, and time. She appears well-developed and well-nourished. No distress.  HENT: No nasal discharge.  Head: Normocephalic and atraumatic.  Eyes: Pupils are equal, round, and reactive to light. Right eye exhibits no discharge. Left eye exhibits no discharge.  Neck: Normal range of motion. Neck supple. No JVD present. No thyromegaly present.  Cardiovascular: Bradycardic, regular rhythm, normal heart sounds. Exam reveals no gallop and no friction rub. There a 2/6 systolic ejection murmur at the base of the heart. Pulmonary/Chest: Effort normal and breath sounds normal. No stridor. No respiratory distress. She has no wheezes. She has no rales. She exhibits no tenderness.  Abdominal: Soft. Bowel sounds are normal. She exhibits no distension. There is no tenderness. There is no rebound and no guarding.  Musculoskeletal: Normal range of motion. She exhibits no edema and no tenderness.  Neurological: She is alert and oriented to person, place, and time. Coordination normal.  Skin: Skin is warm and dry. No rash noted. She is not diaphoretic. No erythema. No pallor.  Psychiatric: She has a normal mood and affect. Her behavior is normal. Judgment and thought content  normal.     EKG: Marked sinus  Bradycardia  BORDERLINE RHYTHM   ASSESSMENT AND PLAN

## 2014-06-02 NOTE — Assessment & Plan Note (Signed)
This actually has improved gradually with most recent systolic pulmonary pressure of 49 mmHg.

## 2014-06-02 NOTE — Assessment & Plan Note (Signed)
This was mild to moderate in most recent echocardiogram in July. Continue observation.

## 2014-06-02 NOTE — Patient Instructions (Signed)
Continue same medications.   Your physician wants you to follow-up in: 6 months.  You will receive a reminder letter in the mail two months in advance. If you don't receive a letter, please call our office to schedule the follow-up appointment.  Your next appointment will be scheduled in our new office located at :  [facility]  [address], [address]  [place], [place] [address]  

## 2014-06-02 NOTE — Assessment & Plan Note (Signed)
She is maintaining a normal sinus rhythm after recent successful cardioversion. Continue current medications. She is chronically bradycardic but overall is asymptomatic.

## 2014-06-19 DIAGNOSIS — M542 Cervicalgia: Secondary | ICD-10-CM | POA: Diagnosis not present

## 2014-06-19 DIAGNOSIS — R599 Enlarged lymph nodes, unspecified: Secondary | ICD-10-CM | POA: Diagnosis not present

## 2014-06-30 ENCOUNTER — Other Ambulatory Visit: Payer: Self-pay | Admitting: Cardiovascular Disease

## 2014-07-15 DIAGNOSIS — E785 Hyperlipidemia, unspecified: Secondary | ICD-10-CM | POA: Diagnosis not present

## 2014-07-15 DIAGNOSIS — M79621 Pain in right upper arm: Secondary | ICD-10-CM | POA: Diagnosis not present

## 2014-07-15 DIAGNOSIS — R946 Abnormal results of thyroid function studies: Secondary | ICD-10-CM | POA: Diagnosis not present

## 2014-07-16 DIAGNOSIS — I270 Primary pulmonary hypertension: Secondary | ICD-10-CM | POA: Diagnosis not present

## 2014-07-16 DIAGNOSIS — R739 Hyperglycemia, unspecified: Secondary | ICD-10-CM | POA: Diagnosis not present

## 2014-07-16 DIAGNOSIS — I480 Paroxysmal atrial fibrillation: Secondary | ICD-10-CM | POA: Diagnosis not present

## 2014-07-16 DIAGNOSIS — E785 Hyperlipidemia, unspecified: Secondary | ICD-10-CM | POA: Diagnosis not present

## 2014-07-19 ENCOUNTER — Telehealth: Payer: Self-pay | Admitting: Internal Medicine

## 2014-07-19 NOTE — Telephone Encounter (Signed)
She called because at 2am today she felt palpitations and her HR is 106 and irregular. She is due to take her rhythmol and metoprolol at [facility] but she also has some diltiazem and wanted to know if she should take that instead. I advised her to take her usual propafenone and metoprolol 1 hr earlier at 5a and see if it helped. Otherwise I counseled her to come to the ED if she had new or worsening sxs. Otherwise she should call Dr. Tyrell Antonio office Monday morning to inquire about a better rhythm control strategy.  Raliegh Ip, MD MPH

## 2014-08-14 ENCOUNTER — Other Ambulatory Visit: Payer: Self-pay | Admitting: Cardiovascular Disease

## 2014-09-02 DIAGNOSIS — L304 Erythema intertrigo: Secondary | ICD-10-CM | POA: Diagnosis not present

## 2014-09-02 DIAGNOSIS — R1084 Generalized abdominal pain: Secondary | ICD-10-CM | POA: Diagnosis not present

## 2014-09-02 DIAGNOSIS — R739 Hyperglycemia, unspecified: Secondary | ICD-10-CM | POA: Diagnosis not present

## 2014-10-03 DIAGNOSIS — H2511 Age-related nuclear cataract, right eye: Secondary | ICD-10-CM | POA: Diagnosis not present

## 2014-10-30 DIAGNOSIS — H2589 Other age-related cataract: Secondary | ICD-10-CM | POA: Diagnosis not present

## 2014-10-30 DIAGNOSIS — H4011X1 Primary open-angle glaucoma, mild stage: Secondary | ICD-10-CM | POA: Diagnosis not present

## 2014-10-30 DIAGNOSIS — H2511 Age-related nuclear cataract, right eye: Secondary | ICD-10-CM | POA: Diagnosis not present

## 2014-11-26 DIAGNOSIS — I480 Paroxysmal atrial fibrillation: Secondary | ICD-10-CM | POA: Diagnosis not present

## 2014-11-26 DIAGNOSIS — I270 Primary pulmonary hypertension: Secondary | ICD-10-CM | POA: Diagnosis not present

## 2014-11-26 DIAGNOSIS — I340 Nonrheumatic mitral (valve) insufficiency: Secondary | ICD-10-CM | POA: Diagnosis not present

## 2014-11-26 DIAGNOSIS — Z1389 Encounter for screening for other disorder: Secondary | ICD-10-CM | POA: Diagnosis not present

## 2014-11-26 DIAGNOSIS — H8110 Benign paroxysmal vertigo, unspecified ear: Secondary | ICD-10-CM | POA: Diagnosis not present

## 2014-11-26 DIAGNOSIS — N644 Mastodynia: Secondary | ICD-10-CM | POA: Diagnosis not present

## 2014-11-28 DIAGNOSIS — R05 Cough: Secondary | ICD-10-CM | POA: Diagnosis not present

## 2014-11-28 DIAGNOSIS — J209 Acute bronchitis, unspecified: Secondary | ICD-10-CM | POA: Diagnosis not present

## 2014-12-08 ENCOUNTER — Encounter: Payer: Medicare Other | Admitting: Cardiovascular Disease

## 2014-12-08 ENCOUNTER — Ambulatory Visit (INDEPENDENT_AMBULATORY_CARE_PROVIDER_SITE_OTHER): Payer: Medicare Other | Admitting: Physician Assistant

## 2014-12-08 ENCOUNTER — Encounter: Payer: Self-pay | Admitting: Physician Assistant

## 2014-12-08 VITALS — BP 110/74 | HR 52 | Ht 68.0 in | Wt 213.0 lb

## 2014-12-08 DIAGNOSIS — I050 Rheumatic mitral stenosis: Secondary | ICD-10-CM | POA: Diagnosis not present

## 2014-12-08 DIAGNOSIS — I270 Primary pulmonary hypertension: Secondary | ICD-10-CM

## 2014-12-08 DIAGNOSIS — R0600 Dyspnea, unspecified: Secondary | ICD-10-CM

## 2014-12-08 DIAGNOSIS — I4891 Unspecified atrial fibrillation: Secondary | ICD-10-CM | POA: Diagnosis not present

## 2014-12-08 DIAGNOSIS — I272 Pulmonary hypertension, unspecified: Secondary | ICD-10-CM

## 2014-12-08 DIAGNOSIS — R5381 Other malaise: Secondary | ICD-10-CM

## 2014-12-08 NOTE — Patient Instructions (Signed)
Medication Instructions:  Continue with your current medications  Labwork: Today: CBC, TSH, BMET  Testing/Procedures: Echocardiogram   Follow-Up: 3 months with Dr. Fletcher Anon  Any Other Special Instructions Will Be Listed Below (If Applicable).

## 2014-12-08 NOTE — Progress Notes (Signed)
Cardiology Office Note:  Date of Encounter: 12/08/2014  ID: Taylor Weber, DOB 07/15/1946, MRN [id number]  PCP:  Jeri Modena Primary Cardiologist:  Dr. Fletcher Anon, MD  Chief Complaint  Patient presents with  . other    6 month f/u c/o low hr and edema ankles. Meds reveiwed verbally with pt.    HPI:  69 year old female who is here today for followup visit. She has history of persistent atrial fibrillation on Xarelto without any reported adverse events. In the past when she was on warfarin she had significant difficulty in maintaining therapeutic INR. 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 No coronary artery disease on previous cardiac catheterization.  Most recent echocardiogram in July 2015 showed normal LV systolic function, mild to moderate mitral stenosis with a valve area of 1.52, mildly dilated left atrium and mild pulmonary hypertension with a peak systolic artery pressure 45 mm Hg.    At her last office visit she has been maintaining in sinus rhythm on Rythmol. She was seen 05/2014 for symptomatic atrial fibrillation. She underwent successful cardioversion with a deceleration of normal sinus rhythm. At follow up on 06/02/2014 she reported feeling better. She has chronic sinus bradycardia which has been relatively stable.   She comes in today stating she has been having bradycardia x 6 months, asymptomatic and subjective ankle edema in the evening time only. She has known long standing asymptomatic bradycardia that dates back to 07/2012. At times she does get SOB with exertion, though this is not new for her. She reports not being able to walk for extended distances 2/2 SOB. No chest pain or palpitations. She also notes intermittent lower extremity ankle edema that is present in the evenings. Weight has been stable. No orthopnea, increased salt or fluid intake. No early satiety.  She reports her pulse ox at home runs around 95% on room air.       Past Medical History  Diagnosis Date  . Chest pain     normal coronary angiogram in december 2008  . Mild aortic regurgitation with left ventricular dilation by prior echocardiogram   . Atherosclerotic cerebrovascular disease     nonobstructive  . Breast mass, right   . Silicone leakage from breast implant   . Dyspnea     chronic exertional dyspnea related to intermittant atrial  . Carotid bruit     right   . Pneumonia   . Rheumatic fever     as a child  . Breast cancer     remote right breast mastectomy,bilaterial implants  . Breast cancer     radical mastectomy status post billaterial breast implants and right breast implat rupture  . Diabetes mellitus     diet controlled  . Mitral stenosis     moderate  . 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 Atrial fibrillation     post cardioversion maintain normal sinus rhythm  . Vertigo   :  Past Surgical History  Procedure Laterality Date  . Appendectomy    . Cholecystectomy    . Total abdominal hysterectomy    . Cardiac catheterization  2008    no significant CAD  . Cardiac catheterization  04/2011    No significant CAD, moderate mitral stenosis (mean gradient: 12 mm Hg, MVA: 1.83), moderate pulmonary hypertension (PVR: 2.4 Woods units), Normal LVEDP.   . Cataract extraction Right   :  Social History:  The patient  reports that she quit smoking about 31 years ago. Her smoking use included Cigarettes. She has a 3 pack-year smoking history. She has never used smokeless tobacco. She reports that she does not drink alcohol or use illicit drugs.   Family History  Problem Relation Age of Onset  . Family history unknown: Yes     Allergies:  Allergies  Allergen Reactions  . Amoxicillin-Pot Clavulanate     Other reaction(s): Other (See Comments) Other Reaction: GI Upset  . Darvon   . Levofloxacin   . Nitrofuran Derivatives      (generic for Macrobid)  . Pentazocine Lactate   . Procaine Hcl   . Procaine Hcl   . Propoxyphene Hcl   . Septra [Sulfamethoxazole W/Trimethoprim (Co-Trimoxazole)]   . Clodronic Acid Rash  . Epinephrine Palpitations    Pt is unsure of this reaction  . Lidocaine Palpitations  . Propoxyphene Palpitations     Home Medications:  Current Outpatient Prescriptions  Medication Sig Dispense Refill  . acetaminophen (TYLENOL) 325 MG tablet 650 mg. Take 650 mg by mouth every 6 (six) hours as needed.    . AZOPT 1 % ophthalmic suspension Place 1 drop into both eyes Twice daily.    . furosemide (LASIX) 20 MG tablet TAKE 1 TABLET BY MOUTH DAILY 90 tablet 4  . meclizine (ANTIVERT) 25 MG tablet Take 25 mg by mouth as needed.     . metoprolol tartrate (LOPRESSOR) 25 MG tablet TAKE 1/2 TABLET BY MOUTH TWICE DAILY(IF HEART RATE IS 50 OR GREATER) 90 tablet 3  . nitroGLYCERIN (NITROSTAT) 0.4 MG SL tablet Place 1 tablet (0.4 mg total) under the tongue every 5 (five) minutes as needed. 25 tablet 6  . ondansetron (ZOFRAN) 8 MG tablet Take 8 mg by mouth as needed.     . propafenone (RYTHMOL) 225 MG tablet TAKE 1 TABLET BY MOUTH BY MOUTH EVERY 8 HOURS 270 tablet 3  . XARELTO 20 MG TABS tablet TAKE 1 TABLET BY MOUTH DAILY 90 tablet 3   No current facility-administered medications for this visit.     Review of Systems:  Review of Systems  Constitutional: Positive for malaise/fatigue. Negative for fever, chills, weight loss and diaphoresis.  HENT: Negative for hearing loss.   Eyes: Negative for blurred vision, double vision and photophobia.  Respiratory: Positive for shortness of breath. Negative for cough, hemoptysis, sputum production and wheezing.   Cardiovascular: Negative for chest pain, palpitations, orthopnea, claudication, leg swelling and PND.  Gastrointestinal: Negative for heartburn, nausea, vomiting and abdominal pain.  Musculoskeletal: Negative for myalgias.  Skin: Negative for itching and  rash.  Neurological: Positive for headaches. Negative for dizziness, tingling, tremors and weakness.  Psychiatric/Behavioral: The patient is nervous/anxious.      Physical Exam:  Blood pressure 110/74, pulse 52, height 5\' 8"  (1.727 m), weight 213 lb (96.616 kg). Body mass index is 32.39 kg/(m^2). General: Pleasant, NAD Psych: Normal affect. Neuro: Alert and oriented X 3. Moves all extremities spontaneously. HEENT: Normal  Neck: Supple without bruits or JVD. Lungs:  Resp regular and unlabored, CTA. Heart: RRR no s3, s4, or murmurs. Abdomen: Obese, soft, non-tender, non-distended, BS + x 4.  Extremities: No clubbing, cyanosis or edema. DP/PT/Radials 2+ and equal bilaterally.   Accessory Clinical Findings:  EKG - sinus bradycardia, 52 bpm, frequent PACs, no significant st/t changes   Other studies Reviewed: Additional studies/ records that were reviewed today include: PCP office visit.   Recent Labs: 05/16/2014: BUN  15; Creatinine 1.02; Hemoglobin 14.0; Platelets 195; Potassium 3.9; Sodium 141    Weights: Wt Readings from Last 3 Encounters:  12/08/14 213 lb (96.616 kg)  06/02/14 215 lb 8 oz (97.75 kg)  05/16/14 215 lb (97.523 kg)     Assessment & Plan:  1. A-fib:  -She is maintaining NSR today status post successful DCCV 05/2014 with chronic bradycardia -Discussed in detail with her risks and benefits of medication management/changes regarding her bradycardia and the risks of possibly going back into a-fib with lesser medication. She prefers to remain on her current regimen at this time -Continue Lopressor 12.5 mg bid (if heart rate is 50 bpm or greater) -Continue Rythmol 225 mg q 8 hours  -Continue Xarelto 20 mg q supper  2. SOB/reported lower extremity edema: -There is no lower extremity edema appreciated on physical exam today -Check echo to evaluate LV function, wall motion, mitral valve, and pulmonary HTN -SOB is quite possibly multifactorial including: obesity,  lack of endurance, and possibly bradycardia/medication induced lack of chronotropic response   3. Bradycardia: -Overall asymptomatic  -Long standing issue, unchanged -Risks of going back into a-fib outweigh medication adjustments at this time  4. Mitral stenosis:  -Check echo  5. Pulmonary HTN:  -Check echo -Continue Lasix 20 mg daily (has not taken dose today) -Further management pending echo   Dispo: -Follow up with Dr. Fletcher Anon, MD in 3 months   Christell Faith, PA-C [employer] [employer] [place] [place], [place] [address] [phone] [facility] 12/08/2014, 3:45 PM

## 2014-12-09 LAB — CBC
HEMATOCRIT: 38 % (ref 34.0–46.6)
HEMOGLOBIN: 12.8 g/dL (ref 11.1–15.9)
MCH: 29.4 pg (ref 26.6–33.0)
MCHC: 33.7 g/dL (ref 31.5–35.7)
MCV: 87 fL (ref 79–97)
Platelets: 203 10*3/uL (ref 150–379)
RBC: 4.36 x10E6/uL (ref 3.77–5.28)
RDW: 14 % (ref 12.3–15.4)
WBC: 8.6 10*3/uL (ref 3.4–10.8)

## 2014-12-09 LAB — BASIC METABOLIC PANEL
BUN / CREAT RATIO: 22 (ref 11–26)
BUN: 18 mg/dL (ref 8–27)
CALCIUM: 9.1 mg/dL (ref 8.7–10.3)
CO2: 21 mmol/L (ref 18–29)
Chloride: 102 mmol/L (ref 97–108)
Creatinine, Ser: 0.83 mg/dL (ref 0.57–1.00)
GFR calc Af Amer: 82 mL/min/{1.73_m2} (ref >59)
GFR calc non Af Amer: 71 mL/min/{1.73_m2} (ref >59)
GLUCOSE: 116 mg/dL — AB (ref 65–99)
Potassium: 4.5 mmol/L (ref 3.5–5.2)
Sodium: 139 mmol/L (ref 134–144)

## 2014-12-09 LAB — TSH: TSH: 1.83 u[IU]/mL (ref 0.450–4.500)

## 2014-12-10 ENCOUNTER — Telehealth: Payer: Self-pay

## 2014-12-10 NOTE — Telephone Encounter (Signed)
Left msg for pt to call back regarding lab results

## 2014-12-11 DIAGNOSIS — H2512 Age-related nuclear cataract, left eye: Secondary | ICD-10-CM | POA: Diagnosis not present

## 2014-12-27 ENCOUNTER — Telehealth: Payer: Self-pay | Admitting: Physician Assistant

## 2014-12-27 DIAGNOSIS — K088 Other specified disorders of teeth and supporting structures: Secondary | ICD-10-CM | POA: Diagnosis not present

## 2014-12-27 NOTE — Telephone Encounter (Signed)
Patient called weekend answering service. Having a lot of trouble with tooth pain. She plans to seek dental care to see about extraction. She was wondering if she should come off Xarelto for this. She has not yet discussed with dentist about what plans are for extraction. Since we typically take direction from the dentist about whether or not they think they can extract on blood thinner, I asked her to please discuss with dentist first about the plan before stopping blood thinner. If he has concerns about needing to hold this, she should call back to discuss further. She verbalized understanding and gratitude. Hadar Elgersma PA-C

## 2015-01-01 ENCOUNTER — Other Ambulatory Visit: Payer: Medicare Other

## 2015-01-01 ENCOUNTER — Encounter: Payer: Medicare Other | Admitting: Cardiovascular Disease

## 2015-01-02 ENCOUNTER — Other Ambulatory Visit: Payer: Medicare Other

## 2015-01-09 ENCOUNTER — Ambulatory Visit (INDEPENDENT_AMBULATORY_CARE_PROVIDER_SITE_OTHER): Payer: Medicare Other

## 2015-01-09 ENCOUNTER — Other Ambulatory Visit: Payer: Self-pay

## 2015-01-09 DIAGNOSIS — I4891 Unspecified atrial fibrillation: Secondary | ICD-10-CM | POA: Diagnosis not present

## 2015-01-12 ENCOUNTER — Ambulatory Visit: Payer: Medicare Other | Admitting: Cardiovascular Disease

## 2015-01-12 ENCOUNTER — Telehealth: Payer: Self-pay

## 2015-01-12 ENCOUNTER — Telehealth: Payer: Self-pay | Admitting: Cardiology

## 2015-01-12 NOTE — Telephone Encounter (Signed)
Pt woke up in AF with rates 90s to 110s. She wants to know about PRN rate control meds. I instructed her to take an extra 12.5mg  lopressor now.She can take her usual AM does in a few hours.She will call Dr. Tyrell Antonio office in AM to schedule a DCCV if needed.

## 2015-01-12 NOTE — Telephone Encounter (Signed)
Pt states she woke up in afib, now her HR is running in the 130s. States she called the Dr. On call. See note from on call. Dr. Quintella Baton since 5:00 am she feel awful. Please call.

## 2015-01-12 NOTE — Telephone Encounter (Signed)
S/w pt who indicates she is in afib, rate 130s. Took extra metoprolol at 5:30am and morning dose 7:00am yet still continues to be in afib. States she had cardioversion Nov 2015. Will continue to monitor heart rate and will see Dr. Fletcher Anon today at 3:30pm

## 2015-01-14 ENCOUNTER — Telehealth: Payer: Self-pay

## 2015-01-14 NOTE — Telephone Encounter (Signed)
see result note

## 2015-01-16 ENCOUNTER — Telehealth: Payer: Self-pay | Admitting: *Deleted

## 2015-01-16 NOTE — Telephone Encounter (Signed)
S/w patient to notify her that cardiac clearance has been faxed to [facility]

## 2015-01-16 NOTE — Telephone Encounter (Signed)
Pt calling trying to find out if we did the clearance for her eye surgery,  She was pretty addiment about [place] having it done by now.  Please call.

## 2015-01-22 DIAGNOSIS — H2512 Age-related nuclear cataract, left eye: Secondary | ICD-10-CM | POA: Diagnosis not present

## 2015-01-22 DIAGNOSIS — H4011X1 Primary open-angle glaucoma, mild stage: Secondary | ICD-10-CM | POA: Diagnosis not present

## 2015-01-23 ENCOUNTER — Telehealth: Payer: Self-pay | Admitting: Cardiovascular Disease

## 2015-01-23 NOTE — Telephone Encounter (Signed)
S/w patient who indicates she went back into afib this afternoon. States she had eye surgery yesterday and went back to eye doc today because she was unable to see and states they put needle in her eye to release the pressure. In a lot of pain. States HR 117 and very irregular Took metoprolol this morning and rythmol at 3:30pm Will forward to Dr. Fletcher Anon.

## 2015-01-23 NOTE — Telephone Encounter (Signed)
Asked her to take a dose of metoprolol 25 mg in addition to an extra dose of propafenone 225 mg.

## 2015-01-23 NOTE — Telephone Encounter (Signed)
S/w patient regarding Dr. Tyrell Antonio recommendations. Pt verbalized understanding with no further questions.

## 2015-01-23 NOTE — Telephone Encounter (Signed)
Patient says she has converted back to afib any suggestions?

## 2015-01-28 ENCOUNTER — Telehealth: Payer: Self-pay | Admitting: *Deleted

## 2015-01-28 NOTE — Telephone Encounter (Signed)
Increase Metoprolol to 25 mg bid. If HR remains elevated after few days, bring her to see Thurmond Butts. Cardioversion might be needed.

## 2015-01-28 NOTE — Telephone Encounter (Signed)
Pt is calling stating she had her eye surgery and it did not work Since she still can't see.  She is still in pains here and there.  Her HR is high and knows its because this stress.  But would like to maybe up her medication, Please advise

## 2015-01-28 NOTE — Telephone Encounter (Signed)
S/w patient with Dr. Tyrell Antonio recommendations. Pt states she will take metoprolol 25mg  BID and call us Monday if HR remains elevated.

## 2015-01-28 NOTE — Telephone Encounter (Signed)
S/w pt who states eye surgery was not successful. Working on getting pressure down in her eye as she can not see out of it now and states she may lose her eye.  Indicates HR runs upper 90s to 120 when she's up exerting energy. States she then becomes short of breath and dizzy.  Taking all meds as prescribed as well eye drops for pressure. Symptoms began last Friday. Called office and was advised to take extra metoprolol and rythmol which she states she did but HR continues to be elevated.  Will forward to Dr. Fletcher Anon for review.

## 2015-01-29 ENCOUNTER — Encounter: Payer: Self-pay | Admitting: *Deleted

## 2015-02-06 ENCOUNTER — Telehealth: Payer: Self-pay

## 2015-02-06 NOTE — Telephone Encounter (Signed)
Pt called, states her HR is "still running up". Today it was 125-130, states it eventually comes back down. Please call.

## 2015-02-06 NOTE — Telephone Encounter (Signed)
Patient has been under increased stress lately and experiencing paroxysmal Afib with RVR. Would continue to advise along the same lines as Dr. Fletcher Anon. Have patient continue metoprolol 25 mg bid and Rythmol 225 mg q 8 hours. If she develops Afib with RVR she can go up on her metoprolol to 50 mg to aid in rate control then go back to 25 mg bid scheduled as she as a tendency to go bradycardic in the past. If she is staying in Afib, just rate controlled at times, she certainly may need another DCCV.   -Advise follow up first of next week with me

## 2015-02-06 NOTE — Telephone Encounter (Signed)
Spoke w/ pt.  Advised her of Ryan's recommendation.  She verbalizes understanding, though she is hesitant to increase her metoprolol due to potentially dropping her BP.  Advised that she may call our number after hours & speak w/ NP/PA on call if she is concerned about taking meds. Pt sched to see Christell Faith, PA on Tues 7/5.

## 2015-02-10 ENCOUNTER — Ambulatory Visit: Payer: Medicare Other | Admitting: Physician Assistant

## 2015-02-27 ENCOUNTER — Telehealth: Payer: Self-pay | Admitting: Cardiovascular Disease

## 2015-02-27 ENCOUNTER — Ambulatory Visit (INDEPENDENT_AMBULATORY_CARE_PROVIDER_SITE_OTHER): Payer: Medicare Other | Admitting: Cardiovascular Disease

## 2015-02-27 ENCOUNTER — Encounter: Payer: Self-pay | Admitting: Cardiovascular Disease

## 2015-02-27 VITALS — BP 90/80 | HR 95 | Ht 68.0 in | Wt 217.0 lb

## 2015-02-27 DIAGNOSIS — I4891 Unspecified atrial fibrillation: Secondary | ICD-10-CM | POA: Diagnosis not present

## 2015-02-27 DIAGNOSIS — Z9011 Acquired absence of right breast and nipple: Secondary | ICD-10-CM | POA: Diagnosis not present

## 2015-02-27 DIAGNOSIS — E119 Type 2 diabetes mellitus without complications: Secondary | ICD-10-CM | POA: Diagnosis not present

## 2015-02-27 DIAGNOSIS — R0602 Shortness of breath: Secondary | ICD-10-CM

## 2015-02-27 DIAGNOSIS — I270 Primary pulmonary hypertension: Secondary | ICD-10-CM

## 2015-02-27 DIAGNOSIS — I272 Other secondary pulmonary hypertension: Secondary | ICD-10-CM | POA: Diagnosis not present

## 2015-02-27 DIAGNOSIS — I481 Persistent atrial fibrillation: Secondary | ICD-10-CM | POA: Diagnosis not present

## 2015-02-27 DIAGNOSIS — Z853 Personal history of malignant neoplasm of breast: Secondary | ICD-10-CM | POA: Diagnosis not present

## 2015-02-27 DIAGNOSIS — I050 Rheumatic mitral stenosis: Secondary | ICD-10-CM | POA: Diagnosis not present

## 2015-02-27 DIAGNOSIS — R42 Dizziness and giddiness: Secondary | ICD-10-CM | POA: Diagnosis not present

## 2015-02-27 DIAGNOSIS — I739 Peripheral vascular disease, unspecified: Secondary | ICD-10-CM | POA: Diagnosis not present

## 2015-02-27 DIAGNOSIS — I10 Essential (primary) hypertension: Secondary | ICD-10-CM | POA: Diagnosis not present

## 2015-02-27 DIAGNOSIS — Z79899 Other long term (current) drug therapy: Secondary | ICD-10-CM | POA: Diagnosis not present

## 2015-02-27 DIAGNOSIS — J449 Chronic obstructive pulmonary disease, unspecified: Secondary | ICD-10-CM | POA: Diagnosis not present

## 2015-02-27 DIAGNOSIS — I4819 Other persistent atrial fibrillation: Secondary | ICD-10-CM

## 2015-02-27 DIAGNOSIS — Z87891 Personal history of nicotine dependence: Secondary | ICD-10-CM | POA: Diagnosis not present

## 2015-02-27 DIAGNOSIS — Z7901 Long term (current) use of anticoagulants: Secondary | ICD-10-CM | POA: Diagnosis not present

## 2015-02-27 NOTE — Telephone Encounter (Signed)
Pt seeing Dr. Fletcher Anon at this time

## 2015-02-27 NOTE — Assessment & Plan Note (Signed)
This has increased significantly on echocardiogram from last month with a peak systolic pressure of 75 mmHg. This will require further evaluation with a right and left cardiac catheterization which I plan on doing 4 weeks after cardioversion.

## 2015-02-27 NOTE — Patient Instructions (Addendum)
Medication Instructions:  Your physician recommends that you continue on your current medications as directed. Please refer to the Current Medication list given to you today. Take your Xarelto with food   Labwork: Your physician recommends that you have labs today: CBC, BMET, PT/INR   Testing/Procedures: Your physician has recommended that you have a Cardioversion (DCCV). Electrical Cardioversion uses a jolt of electricity to your heart either through paddles or wired patches attached to your chest. This is a controlled, usually prescheduled, procedure. Defibrillation is done under light anesthesia in the hospital, and you usually go home the day of the procedure. This is done to get your heart back into a normal rhythm. You are not awake for the procedure. Please see the instruction sheet given to you today.  You are scheduled for a Cardioversion on  Monday, July 25th at 7:30am with Dr. Fletcher Anon Please arrive at the [place] [facility] at  6:45a.m. on the day of your procedure.  DIET INSTRUCTIONS:  Nothing to eat or drink after midnight except your medications with a sip of water.        1) Medications:  YOU MAY TAKE ALL of your remaining medications with a small amount of water.  2) Must have a responsible person to drive you home.  3) Bring a current list of your medications and current insurance cards.    If you have any questions after you get home, please call the office at [phone].  Follow-Up: Your physician recommends that you schedule a follow-up appointment in: three weeks with Dr. Fletcher Anon.    Any Other Special Instructions Will Be Listed Below (If Applicable).

## 2015-02-27 NOTE — Telephone Encounter (Signed)
Patient in office for appt and states she is out of rhythm and SOB .

## 2015-02-27 NOTE — Assessment & Plan Note (Signed)
This was moderate on most recent echocardiogram last month.

## 2015-02-27 NOTE — Progress Notes (Signed)
HPI  This is a 69 year old female who is here today for followup visit. She has persistent atrial fibrillation . She is on anticoagulation with Xarelto without any reported side effects. In the past when she was on warfarin she had significant difficulty in maintaining therapeutic INR. 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 No coronary artery disease on previous cardiac catheterization. She has been on multiple antiarrhythmics medications most recently Rythmol. She has required multiple cardioversions most recently in October of last year.  She is extremely symptomatic when she goes into atrial fibrillation. She went into atrial fibrillation last month and has been feeling awful. She complains of severe exertional palpitations and shortness of breath. Echocardiogram last month showed an ejection fraction of 55-60%, mild aortic regurgitation, rheumatic stenosis of the mitral valve with moderate stenosis. Valve area was 1.21. Left atrium was moderately dilated. There was moderate to severe tricuspid regurgitation with severe pulmonary pressure. Estimated systolic pulmonary artery pressure was 75 mmHg.  Allergies  Allergen Reactions  . Amoxicillin-Pot Clavulanate     Other reaction(s): Other (See Comments) Other Reaction: GI Upset  . Darvon   . Levofloxacin   . Nitrofuran Derivatives     (generic for Macrobid)  . Pentazocine Lactate   . Procaine Hcl   . Procaine Hcl   . Propoxyphene Hcl   . Septra [Sulfamethoxazole W/Trimethoprim (Co-Trimoxazole)]   . Clodronic Acid Rash  . Epinephrine Palpitations    Pt is unsure of this reaction  . Lidocaine Palpitations  . Propoxyphene Palpitations     Current Outpatient Prescriptions on File Prior to Visit  Medication Sig Dispense Refill  . acetaminophen (TYLENOL) 325 MG tablet 650 mg. Take 650 mg by mouth every 6 (six) hours as needed.    . AZOPT 1 % ophthalmic suspension Place 1 drop  into both eyes Twice daily.    . furosemide (LASIX) 20 MG tablet TAKE 1 TABLET BY MOUTH DAILY 90 tablet 4  . meclizine (ANTIVERT) 25 MG tablet Take 25 mg by mouth as needed.     . metoprolol tartrate (LOPRESSOR) 25 MG tablet TAKE 1/2 TABLET BY MOUTH TWICE DAILY(IF HEART RATE IS 50 OR GREATER) (Patient taking differently: TAKE 1 TABLET BY MOUTH TWICE DAILY(IF HEART RATE IS 50 OR GREATER)) 90 tablet 3  . nitroGLYCERIN (NITROSTAT) 0.4 MG SL tablet Place 1 tablet (0.4 mg total) under the tongue every 5 (five) minutes as needed. 25 tablet 6  . ondansetron (ZOFRAN) 8 MG tablet Take 8 mg by mouth as needed.     . propafenone (RYTHMOL) 225 MG tablet TAKE 1 TABLET BY MOUTH BY MOUTH EVERY 8 HOURS 270 tablet 3  . XARELTO 20 MG TABS tablet TAKE 1 TABLET BY MOUTH DAILY 90 tablet 3   No current facility-administered medications on file prior to visit.     Past Medical History  Diagnosis Date  . Chest pain     normal coronary angiogram in december 2008  . Mild aortic regurgitation with left ventricular dilation by prior echocardiogram   . Atherosclerotic cerebrovascular disease     nonobstructive  . Breast mass, right   . Silicone leakage from breast implant   . Dyspnea     chronic exertional dyspnea related to intermittant atrial  . Carotid bruit     right   . Pneumonia   . Rheumatic fever     as a child  . Breast cancer     remote right breast  mastectomy,bilaterial implants  . Breast cancer     radical mastectomy status post billaterial breast implants and right breast implat rupture  . Diabetes mellitus     diet controlled  . Mitral stenosis     moderate  . 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 Atrial fibrillation     post cardioversion maintain normal sinus rhythm  . Vertigo      Past Surgical History  Procedure Laterality Date  . Appendectomy    . Cholecystectomy    . Total abdominal hysterectomy    . Cardiac catheterization  2008    no  significant CAD  . Cardiac catheterization  04/2011    No significant CAD, moderate mitral stenosis (mean gradient: 12 mm Hg, MVA: 1.83), moderate pulmonary hypertension (PVR: 2.4 Woods units), Normal LVEDP.   . Cataract extraction Right      Family History  Problem Relation Age of Onset  . Family history unknown: Yes     History   Social History  . Marital Status: Married    Spouse Name: N/A  . Number of Children: N/A  . Years of Education: N/A   Occupational History  .      lives in [place] History Main Topics  . Smoking status: Former Smoker -- 0.30 packs/day for 10 years    Types: Cigarettes    Quit date: 08/09/1983  . Smokeless tobacco: Never Used  . Alcohol Use: No  . Drug Use: No  . Sexual Activity: Not on file   Other Topics Concern  . Not on file   Social History Narrative     PHYSICAL EXAM   BP 90/80 mmHg  Pulse 95  Ht 5\' 8"  (1.727 m)  Wt 217 lb (98.431 kg)  BMI 33.00 kg/m2  Constitutional: She is oriented to person, place, and time. She appears well-developed and well-nourished. No distress.  HENT: No nasal discharge.  Head: Normocephalic and atraumatic.  Eyes: Pupils are equal, round, and reactive to light. Right eye exhibits no discharge. Left eye exhibits no discharge.  Neck: Normal range of motion. Neck supple. No JVD present. No thyromegaly present.  Cardiovascular:Normal rate, irregular rhythm, normal heart sounds. Exam reveals no gallop and no friction rub. There a 2/6 systolic ejection murmur at the base of the heart. Pulmonary/Chest: Effort normal and breath sounds normal. No stridor. No respiratory distress. She has no wheezes. She has no rales. She exhibits no tenderness.  Abdominal: Soft. Bowel sounds are normal. She exhibits no distension. There is no tenderness. There is no rebound and no guarding.  Musculoskeletal: Normal range of motion. She exhibits no edema and no tenderness.  Neurological: She is alert and  oriented to person, place, and time. Coordination normal.  Skin: Skin is warm and dry. No rash noted. She is not diaphoretic. No erythema. No pallor.  Psychiatric: She has a normal mood and affect. Her behavior is normal. Judgment and thought content normal.     EKG: Possible atrial fibrillation  -Nonspecific QRS widening and anterior fascicular block.   -  Diffuse nonspecific T-abnormality.   ABNORMAL    ASSESSMENT AND PLAN

## 2015-02-27 NOTE — Assessment & Plan Note (Addendum)
The patient is back in atrial fibrillation and she is significantly symptomatic likely due to underlying moderate mitral stenosis. Blood pressure is low and does not allow uptitrating the dose of beta blocker. Thus, I recommend proceeding with cardioversion. She is already on anticoagulation. She has not missed any doses. I explained risks and benefits including the risk of stroke. The patient might ultimately require mitral valve surgery with maze procedure.

## 2015-02-28 LAB — BASIC METABOLIC PANEL
BUN/Creatinine Ratio: 24 (ref 11–26)
BUN: 26 mg/dL (ref 8–27)
CO2: 19 mmol/L (ref 18–29)
CREATININE: 1.08 mg/dL — AB (ref 0.57–1.00)
Calcium: 8.9 mg/dL (ref 8.7–10.3)
Chloride: 102 mmol/L (ref 97–108)
GFR calc Af Amer: 60 mL/min/{1.73_m2} (ref >59)
GFR, EST NON AFRICAN AMERICAN: 52 mL/min/{1.73_m2} — AB (ref >59)
Glucose: 109 mg/dL — ABNORMAL HIGH (ref 65–99)
Potassium: 4.3 mmol/L (ref 3.5–5.2)
SODIUM: 139 mmol/L (ref 134–144)

## 2015-02-28 LAB — CBC
Hematocrit: 36.9 % (ref 34.0–46.6)
Hemoglobin: 12.2 g/dL (ref 11.1–15.9)
MCH: 29.6 pg (ref 26.6–33.0)
MCHC: 33.1 g/dL (ref 31.5–35.7)
MCV: 90 fL (ref 79–97)
Platelets: 177 10*3/uL (ref 150–379)
RBC: 4.12 x10E6/uL (ref 3.77–5.28)
RDW: 15 % (ref 12.3–15.4)
WBC: 8.2 10*3/uL (ref 3.4–10.8)

## 2015-02-28 LAB — PROTIME-INR
INR: 1.5 — ABNORMAL HIGH (ref 0.8–1.2)
Prothrombin Time: 15.4 s — ABNORMAL HIGH (ref 9.1–12.0)

## 2015-03-02 ENCOUNTER — Telehealth: Payer: Self-pay | Admitting: Cardiovascular Disease

## 2015-03-02 ENCOUNTER — Ambulatory Visit: Payer: Medicare Other | Admitting: Registered Nurse

## 2015-03-02 ENCOUNTER — Encounter: Payer: Self-pay | Admitting: *Deleted

## 2015-03-02 ENCOUNTER — Other Ambulatory Visit: Payer: Self-pay

## 2015-03-02 ENCOUNTER — Encounter
Admission: RE | Disposition: A | Discharge status: Home / Self Care | Payer: Self-pay | Source: Ambulatory Visit | Attending: Cardiovascular Disease

## 2015-03-02 ENCOUNTER — Ambulatory Visit
Admission: RE | Admit: 2015-03-02 | Discharge: 2015-03-02 | Disposition: A | Discharge status: Home / Self Care | Payer: Medicare Other | Source: Ambulatory Visit | Attending: Cardiovascular Disease | Admitting: Cardiovascular Disease

## 2015-03-02 DIAGNOSIS — I050 Rheumatic mitral stenosis: Secondary | ICD-10-CM | POA: Insufficient documentation

## 2015-03-02 DIAGNOSIS — I739 Peripheral vascular disease, unspecified: Secondary | ICD-10-CM | POA: Insufficient documentation

## 2015-03-02 DIAGNOSIS — I272 Other secondary pulmonary hypertension: Secondary | ICD-10-CM | POA: Insufficient documentation

## 2015-03-02 DIAGNOSIS — Z87891 Personal history of nicotine dependence: Secondary | ICD-10-CM | POA: Insufficient documentation

## 2015-03-02 DIAGNOSIS — Z853 Personal history of malignant neoplasm of breast: Secondary | ICD-10-CM | POA: Insufficient documentation

## 2015-03-02 DIAGNOSIS — I4891 Unspecified atrial fibrillation: Secondary | ICD-10-CM | POA: Diagnosis not present

## 2015-03-02 DIAGNOSIS — R42 Dizziness and giddiness: Secondary | ICD-10-CM | POA: Diagnosis not present

## 2015-03-02 DIAGNOSIS — E119 Type 2 diabetes mellitus without complications: Secondary | ICD-10-CM | POA: Diagnosis not present

## 2015-03-02 DIAGNOSIS — Z9011 Acquired absence of right breast and nipple: Secondary | ICD-10-CM | POA: Insufficient documentation

## 2015-03-02 DIAGNOSIS — I10 Essential (primary) hypertension: Secondary | ICD-10-CM | POA: Diagnosis not present

## 2015-03-02 DIAGNOSIS — J449 Chronic obstructive pulmonary disease, unspecified: Secondary | ICD-10-CM | POA: Insufficient documentation

## 2015-03-02 DIAGNOSIS — Z7901 Long term (current) use of anticoagulants: Secondary | ICD-10-CM | POA: Insufficient documentation

## 2015-03-02 DIAGNOSIS — I481 Persistent atrial fibrillation: Secondary | ICD-10-CM | POA: Insufficient documentation

## 2015-03-02 DIAGNOSIS — Z79899 Other long term (current) drug therapy: Secondary | ICD-10-CM | POA: Insufficient documentation

## 2015-03-02 HISTORY — DX: Disorder of arteries and arterioles, unspecified: I77.9

## 2015-03-02 HISTORY — PX: ELECTROPHYSIOLOGIC STUDY: SHX172A

## 2015-03-02 HISTORY — DX: Peripheral vascular disease, unspecified: I73.9

## 2015-03-02 SURGERY — CARDIOVERSION (CATH LAB)
Anesthesia: General

## 2015-03-02 MED ORDER — SODIUM CHLORIDE 0.9 % IV SOLN
INTRAVENOUS | Status: DC | PRN
  Administered 2015-03-02: 08:00:00 via INTRAVENOUS

## 2015-03-02 MED ORDER — ONDANSETRON HCL 4 MG/2ML IJ SOLN
INTRAMUSCULAR | Status: AC
  Administered 2015-03-02: 4 mg via INTRAVENOUS
  Filled 2015-03-02: qty 2

## 2015-03-02 MED ORDER — ONDANSETRON HCL 4 MG PO TABS: 4.0000 mg | ORAL_TABLET | Freq: Three times a day (TID) | ORAL | Status: DC | PRN

## 2015-03-02 MED ORDER — PROPOFOL 10 MG/ML IV BOLUS
INTRAVENOUS | Status: DC | PRN
  Administered 2015-03-02: 50 mg via INTRAVENOUS
  Administered 2015-03-02: 20 mg via INTRAVENOUS

## 2015-03-02 MED ORDER — SODIUM CHLORIDE 0.9 % IV SOLN
INTRAVENOUS | Status: DC
  Administered 2015-03-02: 07:00:00 via INTRAVENOUS

## 2015-03-02 MED ORDER — EPHEDRINE SULFATE 50 MG/ML IJ SOLN
INTRAMUSCULAR | Status: DC | PRN
  Administered 2015-03-02: 10 mg via INTRAVENOUS

## 2015-03-02 MED ORDER — ONDANSETRON HCL 4 MG/2ML IJ SOLN
4.0000 mg | Freq: Once | INTRAMUSCULAR | Status: AC
  Administered 2015-03-02: 4 mg via INTRAVENOUS

## 2015-03-02 MED ORDER — SODIUM CHLORIDE 0.9 % IV SOLN
10000.0000 ug | INTRAVENOUS | Status: DC | PRN
  Administered 2015-03-02: 50 ug via INTRAVENOUS

## 2015-03-02 NOTE — Anesthesia Postprocedure Evaluation (Signed)
  Anesthesia Post-op Note  Patient: Taylor Weber  Procedure(s) Performed: Procedure(s): Cardioversion (N/A)  Anesthesia type:General  Patient location: PACU  Post pain: Pain level controlled  Post assessment: Post-op Vital signs reviewed, Patient's Cardiovascular Status Stable, Respiratory Function Stable, Patent Airway and No signs of Nausea or vomiting  Post vital signs: Reviewed and stable  Last Vitals:  Filed Vitals:   03/02/15 0742  BP: 128/92  Pulse: 103  Temp:   Resp: 17    Level of consciousness: awake, alert  and patient cooperative  Complications: No apparent anesthesia complications

## 2015-03-02 NOTE — Transfer of Care (Signed)
Immediate Anesthesia Transfer of Care Note  Patient: Taylor Weber  Procedure(s) Performed: Procedure(s): Cardioversion (N/A)  Patient Location: PACU and Cath Lab  Anesthesia Type:General  Level of Consciousness: oriented and sedated  Airway & Oxygen Therapy: Patient connected to nasal cannula oxygen  Post-op Assessment: Report given to RN and Post -op Vital signs reviewed and stable  Post vital signs: Reviewed and stable  Last Vitals:  Filed Vitals:   03/02/15 0742  BP: 128/92  Pulse: 103  Temp:   Resp: 17    Complications: No apparent anesthesia complications

## 2015-03-02 NOTE — Interval H&P Note (Signed)
History and Physical Interval Note:  03/02/2015 9:06 AM  Taylor Weber  has presented today for surgery, with the diagnosis of Afib  The various methods of treatment have been discussed with the patient and family. After consideration of risks, benefits and other options for treatment, the patient has consented to  Procedure(s): Cardioversion (N/A) as a surgical intervention .  The patient's history has been reviewed, patient examined, no change in status, stable for surgery.  I have reviewed the patient's chart and labs.  Questions were answered to the patient's satisfaction.     Kathlyn Sacramento

## 2015-03-02 NOTE — Anesthesia Preprocedure Evaluation (Addendum)
Anesthesia Evaluation  Patient identified by MRN, date of birth, ID band Patient awake    Reviewed: Allergy & Precautions, NPO status , Patient's Chart, lab work & pertinent test results, reviewed documented beta blocker date and time   Airway Mallampati: II  TM Distance: <3 FB Neck ROM: full    Dental no notable dental hx. (+) Teeth Intact, Missing, Caps   Pulmonary shortness of breath and with exertion, COPDformer smoker,  Pulmonary HTN   Pulmonary exam normal       Cardiovascular hypertension, Pt. on home beta blockers and Pt. on medications + angina + Peripheral Vascular Disease + Valvular Problems/Murmurs (mitral stenosis) Rhythm:irregular Rate:Tachycardia  Past heart caths   Neuro/Psych negative neurological ROS  negative psych ROS   GI/Hepatic negative GI ROS, Neg liver ROS,   Endo/Other  negative endocrine ROSdiabetes  Renal/GU negative Renal ROS  negative genitourinary   Musculoskeletal negative musculoskeletal ROS (+)   Abdominal   Peds negative pediatric ROS (+)  Hematology negative hematology ROS (+)   Anesthesia Other Findings   Reproductive/Obstetrics negative OB ROS                          Anesthesia Physical Anesthesia Plan  ASA: II  Anesthesia Plan: General   Post-op Pain Management:    Induction: Intravenous  Airway Management Planned: Nasal Cannula  Additional Equipment:   Intra-op Plan:   Post-operative Plan:   Informed Consent: I have reviewed the patients History and Physical, chart, labs and discussed the procedure including the risks, benefits and alternatives for the proposed anesthesia with the patient or authorized representative who has indicated his/her understanding and acceptance.     Plan Discussed with:   Anesthesia Plan Comments:        Anesthesia Quick Evaluation

## 2015-03-02 NOTE — Anesthesia Procedure Notes (Signed)
Date/Time: 03/02/2015 8:00 AM Performed by: Nelda Marseille Oxygen Delivery Method: Nasal cannula

## 2015-03-02 NOTE — CV Procedure (Signed)
Cardioversion note: A standard informed consent was obtained. Timeout was performed. The pads were placed in the anterior posterior fashion. The patient was given propofol by the anesthesia team.  Successful cardioversion was performed with a 200 J. The patient converted to sinus bradycardia. Pre-and post EKGs were reviewed. The patient tolerated the procedure with no immediate complications.  Recommendations: Continue same medications and follow-up in 3-4 weeks.

## 2015-03-02 NOTE — OR Nursing (Signed)
Discharge instructions reviewed with pt and sister. Sister signed for pt, since pt had anesthesia

## 2015-03-02 NOTE — Discharge Instructions (Signed)
cardio

## 2015-03-02 NOTE — Telephone Encounter (Signed)
Patient wants something for nausea s/p cardioversion and Taylor Weber said he would call something in if she continues to have nausea. Please call to [facility] patient does not want sublingual.

## 2015-03-02 NOTE — Telephone Encounter (Signed)
No VM on home phone, unable to LM Left message on cell phone for pt to CB

## 2015-03-02 NOTE — H&P (View-Only) (Signed)
HPI  This is a 69 year old female who is here today for followup visit. She has persistent atrial fibrillation . She is on anticoagulation with Xarelto without any reported side effects. In the past when she was on warfarin she had significant difficulty in maintaining therapeutic INR. 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 No coronary artery disease on previous cardiac catheterization. She has been on multiple antiarrhythmics medications most recently Rythmol. She has required multiple cardioversions most recently in October of last year.  She is extremely symptomatic when she goes into atrial fibrillation. She went into atrial fibrillation last month and has been feeling awful. She complains of severe exertional palpitations and shortness of breath. Echocardiogram last month showed an ejection fraction of 55-60%, mild aortic regurgitation, rheumatic stenosis of the mitral valve with moderate stenosis. Valve area was 1.21. Left atrium was moderately dilated. There was moderate to severe tricuspid regurgitation with severe pulmonary pressure. Estimated systolic pulmonary artery pressure was 75 mmHg.  Allergies  Allergen Reactions  . Amoxicillin-Pot Clavulanate     Other reaction(s): Other (See Comments) Other Reaction: GI Upset  . Darvon   . Levofloxacin   . Nitrofuran Derivatives     (generic for Macrobid)  . Pentazocine Lactate   . Procaine Hcl   . Procaine Hcl   . Propoxyphene Hcl   . Septra [Sulfamethoxazole W/Trimethoprim (Co-Trimoxazole)]   . Clodronic Acid Rash  . Epinephrine Palpitations    Pt is unsure of this reaction  . Lidocaine Palpitations  . Propoxyphene Palpitations     Current Outpatient Prescriptions on File Prior to Visit  Medication Sig Dispense Refill  . acetaminophen (TYLENOL) 325 MG tablet 650 mg. Take 650 mg by mouth every 6 (six) hours as needed.    . AZOPT 1 % ophthalmic suspension Place 1 drop  into both eyes Twice daily.    . furosemide (LASIX) 20 MG tablet TAKE 1 TABLET BY MOUTH DAILY 90 tablet 4  . meclizine (ANTIVERT) 25 MG tablet Take 25 mg by mouth as needed.     . metoprolol tartrate (LOPRESSOR) 25 MG tablet TAKE 1/2 TABLET BY MOUTH TWICE DAILY(IF HEART RATE IS 50 OR GREATER) (Patient taking differently: TAKE 1 TABLET BY MOUTH TWICE DAILY(IF HEART RATE IS 50 OR GREATER)) 90 tablet 3  . nitroGLYCERIN (NITROSTAT) 0.4 MG SL tablet Place 1 tablet (0.4 mg total) under the tongue every 5 (five) minutes as needed. 25 tablet 6  . ondansetron (ZOFRAN) 8 MG tablet Take 8 mg by mouth as needed.     . propafenone (RYTHMOL) 225 MG tablet TAKE 1 TABLET BY MOUTH BY MOUTH EVERY 8 HOURS 270 tablet 3  . XARELTO 20 MG TABS tablet TAKE 1 TABLET BY MOUTH DAILY 90 tablet 3   No current facility-administered medications on file prior to visit.     Past Medical History  Diagnosis Date  . Chest pain     normal coronary angiogram in december 2008  . Mild aortic regurgitation with left ventricular dilation by prior echocardiogram   . Atherosclerotic cerebrovascular disease     nonobstructive  . Breast mass, right   . Silicone leakage from breast implant   . Dyspnea     chronic exertional dyspnea related to intermittant atrial  . Carotid bruit     right   . Pneumonia   . Rheumatic fever     as a child  . Breast cancer     remote right breast  mastectomy,bilaterial implants  . Breast cancer     radical mastectomy status post billaterial breast implants and right breast implat rupture  . Diabetes mellitus     diet controlled  . Mitral stenosis     moderate  . 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 Atrial fibrillation     post cardioversion maintain normal sinus rhythm  . Vertigo      Past Surgical History  Procedure Laterality Date  . Appendectomy    . Cholecystectomy    . Total abdominal hysterectomy    . Cardiac catheterization  2008    no  significant CAD  . Cardiac catheterization  04/2011    No significant CAD, moderate mitral stenosis (mean gradient: 12 mm Hg, MVA: 1.83), moderate pulmonary hypertension (PVR: 2.4 Woods units), Normal LVEDP.   . Cataract extraction Right      Family History  Problem Relation Age of Onset  . Family history unknown: Yes     History   Social History  . Marital Status: Married    Spouse Name: N/A  . Number of Children: N/A  . Years of Education: N/A   Occupational History  .      lives in [place] History Main Topics  . Smoking status: Former Smoker -- 0.30 packs/day for 10 years    Types: Cigarettes    Quit date: 08/09/1983  . Smokeless tobacco: Never Used  . Alcohol Use: No  . Drug Use: No  . Sexual Activity: Not on file   Other Topics Concern  . Not on file   Social History Narrative     PHYSICAL EXAM   BP 90/80 mmHg  Pulse 95  Ht 5\' 8"  (1.727 m)  Wt 217 lb (98.431 kg)  BMI 33.00 kg/m2  Constitutional: She is oriented to person, place, and time. She appears well-developed and well-nourished. No distress.  HENT: No nasal discharge.  Head: Normocephalic and atraumatic.  Eyes: Pupils are equal, round, and reactive to light. Right eye exhibits no discharge. Left eye exhibits no discharge.  Neck: Normal range of motion. Neck supple. No JVD present. No thyromegaly present.  Cardiovascular:Normal rate, irregular rhythm, normal heart sounds. Exam reveals no gallop and no friction rub. There a 2/6 systolic ejection murmur at the base of the heart. Pulmonary/Chest: Effort normal and breath sounds normal. No stridor. No respiratory distress. She has no wheezes. She has no rales. She exhibits no tenderness.  Abdominal: Soft. Bowel sounds are normal. She exhibits no distension. There is no tenderness. There is no rebound and no guarding.  Musculoskeletal: Normal range of motion. She exhibits no edema and no tenderness.  Neurological: She is alert and  oriented to person, place, and time. Coordination normal.  Skin: Skin is warm and dry. No rash noted. She is not diaphoretic. No erythema. No pallor.  Psychiatric: She has a normal mood and affect. Her behavior is normal. Judgment and thought content normal.     EKG: Possible atrial fibrillation  -Nonspecific QRS widening and anterior fascicular block.   -  Diffuse nonspecific T-abnormality.   ABNORMAL    ASSESSMENT AND PLAN

## 2015-03-02 NOTE — Sedation Documentation (Signed)
Anesthesia present and will be monitoring pt during case. VS recorded by them. Care from SPR to them starts at TIME out

## 2015-03-02 NOTE — Telephone Encounter (Signed)
S/w pt who states she needs something for nausea since cardioversion. States sublingual zofran is too expensive. Prefers PO meds.  Per Dr. Fletcher Anon verbal order: zofran 4mg  PO q8hrs as needed.  Pt will pick up at pharmacy today.

## 2015-03-05 ENCOUNTER — Ambulatory Visit: Payer: Medicare Other | Admitting: Cardiovascular Disease

## 2015-03-09 ENCOUNTER — Telehealth: Payer: Self-pay | Admitting: *Deleted

## 2015-03-09 NOTE — Telephone Encounter (Signed)
Patient c/o Palpitations:  High priority if patient c/o lightheadedness and shortness of breath.  1. How long have you been having palpitations? Afib started an hour ago  2. Are you currently experiencing lightheadedness and shortness of breath? SOB with excertion   3. Have you checked your BP and heart rate? (document readings) 120/78 hr 124  4. Are you experiencing any other symptoms? Just feels out of rythm

## 2015-03-10 NOTE — Telephone Encounter (Signed)
S/w pt who states she has been in afib since yesterday afternoon and is short of breath.  Cardioversion July 25 She is currently at [facility] with her daughter until Saturday.  States HR got as high as 142, currently 98. She is taking medications as directed and staying inside, not exerting herself at this time. Will forward to MD but advised pt to go to ER if HR stays elevated or increased SOB.  Pt verbalized understanding. States she does not like the hospital at [facility] but would consider going if symptoms become worse.

## 2015-03-10 NOTE — Telephone Encounter (Signed)
Pt called back, regarding her phone call yesterday, please call.

## 2015-03-10 NOTE — Telephone Encounter (Signed)
Would stay on Rythmol 3 times per day Try to take full dose metoprolol at least twice per day if blood pressure tolerates Lasix for shortness of breath Would send message to Dr. Fletcher Anon. May need referral to EP for alternate antiarrhythmics

## 2015-03-11 NOTE — Telephone Encounter (Signed)
S/w pt regarding Dr. Donivan Scull recommendations. Pt states she has BP cuff [place] the beach; she is still afib Instructed pt to take BP before taking full dose of metoprolol Pt verbalized understanding and will report back to [place] if this helps.  Forward to Dr. Fletcher Anon for further recommendations.

## 2015-03-17 ENCOUNTER — Telehealth: Payer: Self-pay

## 2015-03-17 NOTE — Telephone Encounter (Signed)
I want to refer her to the A-fib clinic in [place] with Dr. Rayann Heman.

## 2015-03-17 NOTE — Telephone Encounter (Signed)
S/w pt who has returned from the beach and states she is still in afib. Reports rates 80-90s to 114. Taking Rhytmol TID but states she is unable to take 25mg  metoprolol BID because her BP drops too low and is "all over the place" Had cardioversion 7/25 Would like to know what Dr. Fletcher Anon is going to do. Forward to MD

## 2015-03-17 NOTE — Telephone Encounter (Signed)
Pt is calling back regarding recommendations from telephone call last week. Please advise.

## 2015-03-18 NOTE — Telephone Encounter (Signed)
S/w pt regarding Dr. Tyrell Antonio recommendations. Pt is agreeable for appt with Dr. Rayann Heman. Forward to scheduling to make appt.

## 2015-03-26 DIAGNOSIS — R599 Enlarged lymph nodes, unspecified: Secondary | ICD-10-CM | POA: Diagnosis not present

## 2015-03-26 DIAGNOSIS — I480 Paroxysmal atrial fibrillation: Secondary | ICD-10-CM | POA: Diagnosis not present

## 2015-03-26 DIAGNOSIS — I340 Nonrheumatic mitral (valve) insufficiency: Secondary | ICD-10-CM | POA: Diagnosis not present

## 2015-03-26 DIAGNOSIS — R739 Hyperglycemia, unspecified: Secondary | ICD-10-CM | POA: Diagnosis not present

## 2015-03-26 DIAGNOSIS — I270 Primary pulmonary hypertension: Secondary | ICD-10-CM | POA: Diagnosis not present

## 2015-03-26 DIAGNOSIS — E785 Hyperlipidemia, unspecified: Secondary | ICD-10-CM | POA: Diagnosis not present

## 2015-04-01 ENCOUNTER — Other Ambulatory Visit: Payer: Self-pay | Admitting: Physician Assistant

## 2015-04-01 DIAGNOSIS — J209 Acute bronchitis, unspecified: Secondary | ICD-10-CM | POA: Diagnosis not present

## 2015-04-01 DIAGNOSIS — R05 Cough: Secondary | ICD-10-CM | POA: Diagnosis not present

## 2015-04-01 DIAGNOSIS — I480 Paroxysmal atrial fibrillation: Secondary | ICD-10-CM | POA: Diagnosis not present

## 2015-04-01 DIAGNOSIS — F419 Anxiety disorder, unspecified: Secondary | ICD-10-CM | POA: Diagnosis not present

## 2015-04-03 ENCOUNTER — Ambulatory Visit: Payer: Medicare Other | Admitting: Cardiovascular Disease

## 2015-04-08 ENCOUNTER — Ambulatory Visit (INDEPENDENT_AMBULATORY_CARE_PROVIDER_SITE_OTHER): Payer: Medicare Other | Admitting: Internal Medicine

## 2015-04-08 ENCOUNTER — Encounter: Payer: Self-pay | Admitting: Internal Medicine

## 2015-04-08 ENCOUNTER — Encounter: Payer: Self-pay | Admitting: *Deleted

## 2015-04-08 VITALS — BP 114/78 | HR 105 | Ht 68.0 in | Wt 213.1 lb

## 2015-04-08 DIAGNOSIS — I4819 Other persistent atrial fibrillation: Secondary | ICD-10-CM

## 2015-04-08 DIAGNOSIS — I481 Persistent atrial fibrillation: Secondary | ICD-10-CM

## 2015-04-08 DIAGNOSIS — I480 Paroxysmal atrial fibrillation: Secondary | ICD-10-CM

## 2015-04-08 DIAGNOSIS — I270 Primary pulmonary hypertension: Secondary | ICD-10-CM | POA: Diagnosis not present

## 2015-04-08 DIAGNOSIS — I050 Rheumatic mitral stenosis: Secondary | ICD-10-CM

## 2015-04-08 DIAGNOSIS — I272 Pulmonary hypertension, unspecified: Secondary | ICD-10-CM

## 2015-04-08 MED ORDER — METOPROLOL TARTRATE 25 MG PO TABS: 25.0000 mg | ORAL_TABLET | Freq: Two times a day (BID) | ORAL | Status: DC

## 2015-04-08 NOTE — Telephone Encounter (Signed)
This encounter was created in error - please disregard.

## 2015-04-08 NOTE — Patient Instructions (Addendum)
Medication Instructions:  Your physician has recommended you make the following change in your medication:  1) Stop Propafenone 2) Increase Metoprolol to 25 mg twice daily   Labwork: Your physician recommends that you return for lab work today: BMP/CBC    Testing/Procedures: Your physician has requested that you have a TEE. During a TEE, sound waves are used to create images of your heart. It provides your doctor with information about the size and shape of your heart and how well your heart's chambers and valves are working. In this test, a transducer is attached to the end of a flexible tube that's guided down your throat and into your esophagus (the tube leading from you mouth to your stomach) to get a more detailed image of your heart. You are not awake for the procedure. Please see the instruction sheet given to you today. For further information please visit [email or web address].   Your physician has requested that you have a cardiac catheterization. Cardiac catheterization is used to diagnose and/or treat various heart conditions. Doctors may recommend this procedure for a number of different reasons. The most common reason is to evaluate chest pain. Chest pain can be a symptom of coronary artery disease (CAD), and cardiac catheterization can show whether plaque is narrowing or blocking your heart's arteries. This procedure is also used to evaluate the valves, as well as measure the blood flow and oxygen levels in different parts of your heart. For further information please visit [email or web address]. Please follow instruction sheet, as given.     Follow-Up: Your physician recommends that you schedule a follow-up appointment as needed with Dr Rayann Heman  You have been referred to Dr Roxy Manns ---to discuss MAZE procedure     Any Other Special Instructions Will Be Listed Below (If Applicable).

## 2015-04-08 NOTE — Progress Notes (Signed)
Electrophysiology Office Note   Date:  04/08/2015   ID:  Taylor Weber, DOB 23-Apr-1946, MRN [id number]  PCP:  Taylor Weber with [facility]  Cardiologist:  Dr Taylor Weber Primary Electrophysiologist: Taylor Grayer, MD    Chief Complaint  Patient presents with  . Atrial Fibrillation     History of Present Illness: Taylor Weber is a 69 y.o. female who presents today for electrophysiology evaluation.   She has persistent afib in the setting of pulmonary hypertension, mitral stenosis, and tricuspid regurgitation.  She had rheumatic fever at age 51.  She reports having atypical chest pain"years ago" for which she had "tests" and was told to return but did not.  She did well until several years ago when she had recurrent chest pain and SOB.  She was initially followed by Dr Taylor Weber but has more recently been followed by Dr Taylor Weber.  She has rheumatic heart disease.  She has had intermittent episodes of afib for years.  Typically, she could manage this with cardizem.  She has had several prior cardioversions.  More recently her afib has increased in frequency and duration of atrial fibrillation.  She has failed medical therapy with flecainide and propafenone.  She has also tried amiodarone but found this to make her feel "deathly ill" without successful management of her afib.  During afib, she reports symptoms of fatigue and decreased exercise tolerance.  She has significant SOB during afib. She has 3 pillow orthopnea in afib.  Her most recent cardioversion was 7/16.  She reports maintaining sinus rhythm for only a week.  She has been on xarelto for several years.  She was previously on coumadin and "did all right" with this medicine.  Today, she denies symptoms of lower extremity edema, claudication, dizziness, presyncope, syncope, bleeding, or neurologic sequela. The patient is tolerating medications without difficulties and is otherwise without complaint today.    Past Medical History    Diagnosis Date  . Chest pain     normal coronary angiogram in december 2008  . Mild aortic regurgitation with left ventricular dilation by prior echocardiogram   . Atherosclerotic cerebrovascular disease     nonobstructive  . Silicone leakage from breast implant   . Dyspnea     chronic exertional dyspnea related to intermittant atrial  . Carotid bruit     right   . Pneumonia   . Rheumatic fever     as a child  . Breast cancer     remote right breast mastectomy,bilaterial implants  . Breast cancer     radical mastectomy status post billaterial breast implants and right breast implat rupture  . Diabetes mellitus     diet controlled  . Mitral stenosis     moderate  . 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 Persistent atrial fibrillation     post cardioversion maintain normal sinus rhythm  . Vertigo   . Carotid arterial disease    Past Surgical History  Procedure Laterality Date  . Appendectomy    . Cholecystectomy    . Total abdominal hysterectomy    . Cardiac catheterization  2008    no significant CAD  . Cardiac catheterization  04/2011    No significant CAD, moderate mitral stenosis (mean gradient: 12 mm Hg, MVA: 1.83), moderate pulmonary hypertension (PVR: 2.4 Woods units), Normal LVEDP.   . Cataract extraction Right   . Mastectomy Right   . Placement of breast implants  s/p mastectomy     Current Outpatient Prescriptions  Medication Sig Dispense Refill  . acetaminophen (TYLENOL) 325 MG tablet Take 650 mg by mouth every 6 (six) hours as needed for pain    . AZOPT 1 % ophthalmic suspension Place 1 drop into both eyes Twice daily.    . furosemide (LASIX) 20 MG tablet TAKE 1 TABLET BY MOUTH DAILY 90 tablet 0  . meclizine (ANTIVERT) 25 MG tablet Take 25 mg by mouth 2 (two) times daily as needed for dizziness or nausea.     . metoprolol tartrate (LOPRESSOR) 25 MG tablet TAKE 1/2 TABLET BY MOUTH TWICE DAILY(IF HEART RATE IS 50 OR GREATER)  (Patient taking differently: TAKE 1 TABLET BY MOUTH TWICE DAILY(IF HEART RATE IS 50 OR GREATER)) 90 tablet 3  . nitroGLYCERIN (NITROSTAT) 0.4 MG SL tablet Place 0.4 mg under the tongue every 5 (five) minutes as needed for chest pain.    Marland Kitchen ondansetron (ZOFRAN) 4 MG tablet Take 1 tablet (4 mg total) by mouth every 8 (eight) hours as needed for nausea or vomiting. 20 tablet 0  . propafenone (RYTHMOL) 225 MG tablet TAKE 1 TABLET BY MOUTH BY MOUTH EVERY 8 HOURS 270 tablet 3  . XARELTO 20 MG TABS tablet TAKE 1 TABLET BY MOUTH DAILY 90 tablet 3  . amoxicillin (AMOXIL) 500 MG capsule Take 2 capsules by mouth 2 (two) times daily.  0   No current facility-administered medications for this visit.    Allergies:   Amoxicillin-pot clavulanate; Lidocaine; Amiodarone; Darvon; Levofloxacin; Nitrofuran derivatives; Pentazocine lactate; Procaine hcl; Septra; Clodronic acid; Epinephrine; and Propoxyphene   Social History:  The patient  reports that she quit smoking about 31 years ago. Her smoking use included Cigarettes. She has a 3 pack-year smoking history. She has never used smokeless tobacco. She reports that she does not drink alcohol or use illicit drugs.   Family History:  The patient's  family history includes CAD in her sister.    ROS:  Please see the history of present illness.   All other systems are reviewed and negative.    PHYSICAL EXAM: VS:  BP 114/78 mmHg  Pulse 105  Ht 5\' 8"  (1.727 m)  Wt 96.671 kg (213 lb 1.9 oz)  BMI 32.41 kg/m2 , BMI Body mass index is 32.41 kg/(m^2). GEN: Well nourished, well developed, in no acute distress HEENT: normal Neck: no JVD, carotid bruits, or masses Cardiac:tachycardic irregular rhythm, difficult to appreciate murmurs today with tachycardia,no edema  Respiratory:  clear to auscultation bilaterally, normal work of breathing GI: soft, nontender, nondistended, + BS MS: no deformity or atrophy Skin: warm and dry  Neuro:  Strength and sensation are  intact Psych: euthymic mood, full affect  EKG:  EKG is ordered today. The ekg ordered today shows afib, V rate 105 bpm, LAHB,  Prior ekgs in sinus reveal QTc <440   Recent Labs: 05/16/2014: HGB 14.0; Platelet 195 12/08/2014: TSH 1.830 02/27/2015: BUN 26; Creatinine, Ser 1.08*; Potassium 4.3; Sodium 139    Lipid Panel  No results found for: CHOL, TRIG, HDL, CHOLHDL, VLDL, LDLCALC, LDLDIRECT   Wt Readings from Last 3 Encounters:  04/08/15 96.671 kg (213 lb 1.9 oz)  03/02/15 98.431 kg (217 lb)  02/27/15 98.431 kg (217 lb)      Other studies Reviewed: Additional studies/ records that were reviewed today include: Dr Jacklynn Ganong notes, echos 6/16  Review of the above records today demonstrates: EF normal, moderate MS, severe PHTN, moderate to severe TR, moderate LA  enlargement, moderate to severe RA enlargement   ASSESSMENT AND PLAN:  1.  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  I think that our ability to manage afib at this time is quite low.  I have spoken with Dr Taylor Weber and we agree that the best next step would be to better characterize her valvular heart disease.  Will proceed with TEE and RHC/LHC at this time.  Risks associated with the procedure discussed with patient who wishes to proceed.  Hold xarelto for 24 hours prior to the procedure. Will refer to Dr Roxy Manns for assessment afterwards.  I would anticipate possible mitral valve repair with maze +/- tricuspid repair if feasible may be her best option.  She is a poor candidate for ablation. Given valvular afib, would consider switching from xarelto to coumadin.  I discussed this with the patient today.  She would like to discuss further with Dr Taylor Weber.  Current medicines are reviewed at length with the patient today.   The patient does not have concerns regarding her medicines.  The following changes were made today:  none    Signed, Taylor Grayer, MD  04/08/2015 9:06 AM      [facility] [address] [place] [address] [phone] (office) [phone] (fax)

## 2015-04-15 ENCOUNTER — Ambulatory Visit (HOSPITAL_COMMUNITY): Admission: RE | Admit: 2015-04-15 | Payer: Medicare Other | Source: Ambulatory Visit | Admitting: Cardiovascular Disease

## 2015-04-15 ENCOUNTER — Ambulatory Visit (HOSPITAL_BASED_OUTPATIENT_CLINIC_OR_DEPARTMENT_OTHER): Payer: Medicare Other

## 2015-04-15 ENCOUNTER — Encounter (HOSPITAL_COMMUNITY)
Admission: RE | Disposition: A | Discharge status: Home / Self Care | Payer: Self-pay | Source: Ambulatory Visit | Attending: Cardiology

## 2015-04-15 ENCOUNTER — Ambulatory Visit (HOSPITAL_COMMUNITY)
Admission: RE | Admit: 2015-04-15 | Discharge: 2015-04-15 | Disposition: A | Discharge status: Home / Self Care | Payer: Medicare Other | Source: Ambulatory Visit | Attending: Cardiology | Admitting: Cardiology

## 2015-04-15 DIAGNOSIS — I481 Persistent atrial fibrillation: Secondary | ICD-10-CM | POA: Insufficient documentation

## 2015-04-15 DIAGNOSIS — I351 Nonrheumatic aortic (valve) insufficiency: Secondary | ICD-10-CM | POA: Diagnosis not present

## 2015-04-15 DIAGNOSIS — I340 Nonrheumatic mitral (valve) insufficiency: Secondary | ICD-10-CM

## 2015-04-15 DIAGNOSIS — Z853 Personal history of malignant neoplasm of breast: Secondary | ICD-10-CM | POA: Diagnosis not present

## 2015-04-15 DIAGNOSIS — Z87891 Personal history of nicotine dependence: Secondary | ICD-10-CM | POA: Insufficient documentation

## 2015-04-15 DIAGNOSIS — I272 Other secondary pulmonary hypertension: Secondary | ICD-10-CM | POA: Insufficient documentation

## 2015-04-15 DIAGNOSIS — Z7901 Long term (current) use of anticoagulants: Secondary | ICD-10-CM | POA: Diagnosis not present

## 2015-04-15 DIAGNOSIS — E119 Type 2 diabetes mellitus without complications: Secondary | ICD-10-CM | POA: Insufficient documentation

## 2015-04-15 DIAGNOSIS — I059 Rheumatic mitral valve disease, unspecified: Secondary | ICD-10-CM | POA: Diagnosis present

## 2015-04-15 DIAGNOSIS — I050 Rheumatic mitral stenosis: Secondary | ICD-10-CM | POA: Diagnosis not present

## 2015-04-15 DIAGNOSIS — I480 Paroxysmal atrial fibrillation: Secondary | ICD-10-CM

## 2015-04-15 DIAGNOSIS — I2510 Atherosclerotic heart disease of native coronary artery without angina pectoris: Secondary | ICD-10-CM | POA: Insufficient documentation

## 2015-04-15 HISTORY — PX: CARDIAC CATHETERIZATION: SHX172

## 2015-04-15 HISTORY — PX: TEE WITHOUT CARDIOVERSION: SHX5443

## 2015-04-15 LAB — POCT I-STAT 3, VENOUS BLOOD GAS (G3P V)
Acid-base deficit: 2 mmol/L (ref 0.0–2.0)
Bicarbonate: 23.9 mEq/L (ref 20.0–24.0)
O2 Saturation: 67 %
TCO2: 25 mmol/L (ref 0–100)
pCO2, Ven: 44 mmHg — ABNORMAL LOW (ref 45.0–50.0)
pH, Ven: 7.342 — ABNORMAL HIGH (ref 7.250–7.300)
pO2, Ven: 37 mmHg (ref 30.0–45.0)

## 2015-04-15 LAB — BASIC METABOLIC PANEL
ANION GAP: 8 (ref 5–15)
BUN: 21 mg/dL — ABNORMAL HIGH (ref 6–20)
CALCIUM: 8.8 mg/dL — AB (ref 8.9–10.3)
CHLORIDE: 106 mmol/L (ref 101–111)
CO2: 24 mmol/L (ref 22–32)
Creatinine, Ser: 0.79 mg/dL (ref 0.44–1.00)
GFR calc non Af Amer: >60 mL/min (ref >60)
Glucose, Bld: 102 mg/dL — ABNORMAL HIGH (ref 65–99)
POTASSIUM: 5 mmol/L (ref 3.5–5.1)
Sodium: 138 mmol/L (ref 135–145)

## 2015-04-15 LAB — CBC
HEMATOCRIT: 39.9 % (ref 36.0–46.0)
HEMOGLOBIN: 13.3 g/dL (ref 12.0–15.0)
MCH: 30 pg (ref 26.0–34.0)
MCHC: 33.3 g/dL (ref 30.0–36.0)
MCV: 90.1 fL (ref 78.0–100.0)
Platelets: 153 10*3/uL (ref 150–400)
RBC: 4.43 MIL/uL (ref 3.87–5.11)
RDW: 13.4 % (ref 11.5–15.5)
WBC: 7.9 10*3/uL (ref 4.0–10.5)

## 2015-04-15 LAB — POCT I-STAT 3, ART BLOOD GAS (G3+)
Acid-base deficit: 4 mmol/L — ABNORMAL HIGH (ref 0.0–2.0)
Bicarbonate: 21.3 mEq/L (ref 20.0–24.0)
O2 Saturation: 93 %
TCO2: 23 mmol/L (ref 0–100)
pCO2 arterial: 40.5 mmHg (ref 35.0–45.0)
pH, Arterial: 7.329 — ABNORMAL LOW (ref 7.350–7.450)
pO2, Arterial: 71 mmHg — ABNORMAL LOW (ref 80.0–100.0)

## 2015-04-15 LAB — PROTIME-INR
INR: 1.37 (ref 0.00–1.49)
Prothrombin Time: 17 seconds — ABNORMAL HIGH (ref 11.6–15.2)

## 2015-04-15 LAB — GLUCOSE, CAPILLARY
Glucose-Capillary: 99 mg/dL (ref 65–99)
Glucose-Capillary: 84 mg/dL (ref 65–99)

## 2015-04-15 SURGERY — RIGHT/LEFT HEART CATH AND CORONARY ANGIOGRAPHY

## 2015-04-15 SURGERY — ECHOCARDIOGRAM, TRANSESOPHAGEAL
Anesthesia: Moderate Sedation

## 2015-04-15 MED ORDER — MIDAZOLAM HCL 2 MG/2ML IJ SOLN
INTRAMUSCULAR | Status: DC | PRN
  Administered 2015-04-15 (×2): 1 mg via INTRAVENOUS

## 2015-04-15 MED ORDER — SODIUM CHLORIDE 0.9 % IV SOLN
250.0000 mL | INTRAVENOUS | Status: DC | PRN
Start: 2015-04-15 — End: 2015-04-15

## 2015-04-15 MED ORDER — SODIUM CHLORIDE 0.9 % IV SOLN: 250.0000 mL | INTRAVENOUS | Status: DC | PRN

## 2015-04-15 MED ORDER — SODIUM CHLORIDE 0.9 % IV SOLN
INTRAVENOUS | Status: DC
  Administered 2015-04-15: 500 mL via INTRAVENOUS

## 2015-04-15 MED ORDER — SODIUM CHLORIDE 0.9 % IJ SOLN: 3.0000 mL | Freq: Two times a day (BID) | INTRAMUSCULAR | Status: DC

## 2015-04-15 MED ORDER — SODIUM CHLORIDE 0.9 % WEIGHT BASED INFUSION: 3.0000 mL/kg/h | INTRAVENOUS | Status: DC

## 2015-04-15 MED ORDER — SODIUM CHLORIDE 0.9 % IJ SOLN: 3.0000 mL | INTRAMUSCULAR | Status: DC | PRN

## 2015-04-15 MED ORDER — IOHEXOL 350 MG/ML SOLN
INTRAVENOUS | Status: DC | PRN
  Administered 2015-04-15: 100 mL via INTRAVENOUS

## 2015-04-15 MED ORDER — FENTANYL CITRATE (PF) 100 MCG/2ML IJ SOLN
INTRAMUSCULAR | Status: DC | PRN
  Administered 2015-04-15 (×2): 25 ug via INTRAVENOUS

## 2015-04-15 MED ORDER — HEPARIN (PORCINE) IN NACL 2-0.9 UNIT/ML-% IJ SOLN
INTRAMUSCULAR | Status: AC
  Filled 2015-04-15: qty 1000

## 2015-04-15 MED ORDER — MIDAZOLAM HCL 5 MG/ML IJ SOLN
INTRAMUSCULAR | Status: AC
  Filled 2015-04-15: qty 2

## 2015-04-15 MED ORDER — FENTANYL CITRATE (PF) 100 MCG/2ML IJ SOLN
INTRAMUSCULAR | Status: AC
  Filled 2015-04-15: qty 2

## 2015-04-15 MED ORDER — FENTANYL CITRATE (PF) 100 MCG/2ML IJ SOLN
INTRAMUSCULAR | Status: DC | PRN
  Administered 2015-04-15: 12.5 ug via INTRAVENOUS
  Administered 2015-04-15: 25 ug via INTRAVENOUS

## 2015-04-15 MED ORDER — SODIUM CHLORIDE 0.9 % WEIGHT BASED INFUSION
1.0000 mL/kg/h | INTRAVENOUS | Status: AC
Start: 2015-04-15 — End: 2015-04-15

## 2015-04-15 MED ORDER — DIPHENHYDRAMINE HCL 50 MG/ML IJ SOLN
INTRAMUSCULAR | Status: AC
  Filled 2015-04-15: qty 1

## 2015-04-15 MED ORDER — FENTANYL CITRATE (PF) 100 MCG/2ML IJ SOLN
INTRAMUSCULAR | Status: AC
  Filled 2015-04-15: qty 4

## 2015-04-15 MED ORDER — ASPIRIN 81 MG PO CHEW
81.0000 mg | CHEWABLE_TABLET | ORAL | Status: DC
  Filled 2015-04-15: qty 1

## 2015-04-15 MED ORDER — LIDOCAINE HCL (PF) 1 % IJ SOLN
INTRAMUSCULAR | Status: DC | PRN
  Administered 2015-04-15: 15:00:00

## 2015-04-15 MED ORDER — SODIUM CHLORIDE 0.9 % WEIGHT BASED INFUSION: 1.0000 mL/kg/h | INTRAVENOUS | Status: DC

## 2015-04-15 MED ORDER — NITROGLYCERIN 1 MG/10 ML FOR IR/CATH LAB
INTRA_ARTERIAL | Status: AC
  Filled 2015-04-15: qty 10

## 2015-04-15 MED ORDER — MIDAZOLAM HCL 2 MG/2ML IJ SOLN
INTRAMUSCULAR | Status: AC
  Filled 2015-04-15: qty 4

## 2015-04-15 MED ORDER — LIDOCAINE HCL (PF) 1 % IJ SOLN
INTRAMUSCULAR | Status: DC | PRN
  Administered 2015-04-15: 15 mL

## 2015-04-15 MED ORDER — MIDAZOLAM HCL 10 MG/2ML IJ SOLN
INTRAMUSCULAR | Status: DC | PRN
Start: 2015-04-15 — End: 2015-04-15
  Administered 2015-04-15: 2 mg via INTRAVENOUS
  Administered 2015-04-15 (×2): 1 mg via INTRAVENOUS

## 2015-04-15 MED ORDER — LIDOCAINE HCL (PF) 1 % IJ SOLN
INTRAMUSCULAR | Status: AC
  Filled 2015-04-15: qty 30

## 2015-04-15 SURGICAL SUPPLY — 16 items
CATH INFINITI 5FR MULTPACK ANG (CATHETERS) ×2 IMPLANT
CATH SWAN GANZ 7F STRAIGHT (CATHETERS) ×2 IMPLANT
COVER PRB 48X5XTLSCP FOLD TPE (BAG) IMPLANT
COVER PROBE 5X48 (BAG) ×3
GLIDESHEATH SLEND SS 6F .021 (SHEATH) ×1 IMPLANT
KIT HEART LEFT (KITS) ×3 IMPLANT
KIT HEART RIGHT NAMIC (KITS) ×3 IMPLANT
PACK CARDIAC CATHETERIZATION (CUSTOM PROCEDURE TRAY) ×3 IMPLANT
SHEATH PINNACLE 5F 10CM (SHEATH) ×2 IMPLANT
SHEATH PINNACLE 7F 10CM (SHEATH) ×2 IMPLANT
SYR MEDRAD MARK V 150ML (SYRINGE) ×3 IMPLANT
TRANSDUCER W/STOPCOCK (MISCELLANEOUS) ×6 IMPLANT
TUBING CIL FLEX 10 FLL-RA (TUBING) ×1 IMPLANT
WIRE EMERALD 3MM-J .025X260CM (WIRE) ×2 IMPLANT
WIRE EMERALD 3MM-J .035X150CM (WIRE) ×2 IMPLANT
WIRE SAFE-T 1.5MM-J .035X260CM (WIRE) ×1 IMPLANT

## 2015-04-15 NOTE — Progress Notes (Signed)
Site area: rt groin fa and fv sheaths Site Prior to Removal:  Level 0 Pressure Applied For:  20 minutes Manual:   yes Patient Status During Pull:  stable Post Pull Site:  Level  0 Post Pull Instructions Given:  yes Post Pull Pulses Present: yes Dressing Applied:  tegaderm Bedrest begins @ [id number] Comments: 0

## 2015-04-15 NOTE — Interval H&P Note (Signed)
History and Physical Interval Note:  04/15/2015 10:02 AM  Taylor Weber  has presented today for surgery, with the diagnosis of MITRAL STENOSIS  The various methods of treatment have been discussed with the patient and family. After consideration of risks, benefits and other options for treatment, the patient has consented to  Procedure(s): TRANSESOPHAGEAL ECHOCARDIOGRAM (TEE) (N/A) as a surgical intervention .  The patient's history has been reviewed, patient examined, no change in status, stable for surgery.  I have reviewed the patient's chart and labs.  Questions were answered to the patient's satisfaction.     Dorothy Spark

## 2015-04-15 NOTE — CV Procedure (Signed)
   Transesophageal Echocardiogram Note  LABELLA GOLOB [id number] [birth year]  Procedure: Transesophageal Echocardiogram Indications: Mitral stenosis  Procedure Details Consent: Obtained Time Out: Verified patient identification, verified procedure, site/side was marked, verified correct patient position, special equipment/implants available, Radiology Safety Procedures followed,  medications/allergies/relevent history reviewed, required imaging and test results available.  Performed  Medications: Fentanyl: 25 mcg Versed: 3 mg  Left ventricle: The cavity size was normal. Wall thickness was normal. Systolic function was normal. The estimated ejection fraction was low normal estimated at 50%. Wall motion was normal; there were no regional wall motion abnormalities.  function. Aortic valve: There was mild to moderate regurgitation. Mitral valve is severely thickened predominantly of the anterior leaflet, there is a typical hockey stick morpholgy with limited opening and associated mean gradient of 7 mmHg.   Left atrium: The atrium was moderately dilated. Tricuspid valve: There was moderate-severe regurgitation. Pulmonary arteries: Systolic pressure was severely increased. PA peak pressure: 65 mm Hg (S) + RAP consistent with severe pulmonary hypertension. .  Complications: No apparent complications Patient did tolerate procedure well.  Dorothy Spark, MD, [facility] 04/15/2015, 10:03 AM

## 2015-04-15 NOTE — Interval H&P Note (Signed)
History and Physical Interval Note:  04/15/2015 1:42 PM  Taylor Weber  has presented today for surgery, with the diagnosis of stenosis  The various methods of treatment have been discussed with the patient and family. After consideration of risks, benefits and other options for treatment, the patient has consented to  Procedure(s): Right/Left Heart Cath and Coronary Angiography (N/A) as a surgical intervention .  The patient's history has been reviewed, patient examined, no change in status, stable for surgery.  I have reviewed the patient's chart and labs.  Questions were answered to the patient's satisfaction.     Kathlyn Sacramento

## 2015-04-15 NOTE — H&P (View-Only) (Signed)
Electrophysiology Office Note   Date:  04/08/2015   ID:  Taylor Weber, DOB [birth year], MRN [id number]  PCP:  Taylor Weber with [facility]  Cardiologist:  Taylor Taylor Weber Primary Electrophysiologist: Taylor Grayer, MD    Chief Complaint  Patient presents with  . Atrial Fibrillation     History of Present Illness: Taylor Weber is a 69 y.o. female who presents today for electrophysiology evaluation.   She has persistent afib in the setting of pulmonary hypertension, mitral stenosis, and tricuspid regurgitation.  She had rheumatic fever at age 15.  She reports having atypical chest pain"years ago" for which she had "tests" and was told to return but did not.  She did well until several years ago when she had recurrent chest pain and SOB.  She was initially followed by Taylor Weber but has more recently been followed by Taylor Taylor Weber.  She has rheumatic heart disease.  She has had intermittent episodes of afib for years.  Typically, she could manage this with cardizem.  She has had several prior cardioversions.  More recently her afib has increased in frequency and duration of atrial fibrillation.  She has failed medical therapy with flecainide and propafenone.  She has also tried amiodarone but found this to make her feel "deathly ill" without successful management of her afib.  During afib, she reports symptoms of fatigue and decreased exercise tolerance.  She has significant SOB during afib. She has 3 pillow orthopnea in afib.  Her most recent cardioversion was 7/16.  She reports maintaining sinus rhythm for only a week.  She has been on xarelto for several years.  She was previously on coumadin and "did all right" with this medicine.  Today, she denies symptoms of lower extremity edema, claudication, dizziness, presyncope, syncope, bleeding, or neurologic sequela. The patient is tolerating medications without difficulties and is otherwise without complaint today.    Past Medical History    Diagnosis Date  . Chest pain     normal coronary angiogram in december 2008  . Mild aortic regurgitation with left ventricular dilation by prior echocardiogram   . Atherosclerotic cerebrovascular disease     nonobstructive  . Silicone leakage from breast implant   . Dyspnea     chronic exertional dyspnea related to intermittant atrial  . Carotid bruit     right   . Pneumonia   . Rheumatic fever     as a child  . Breast cancer     remote right breast mastectomy,bilaterial implants  . Breast cancer     radical mastectomy status post billaterial breast implants and right breast implat rupture  . Diabetes mellitus     diet controlled  . Mitral stenosis     moderate  . 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 Persistent atrial fibrillation     post cardioversion maintain normal sinus rhythm  . Vertigo   . Carotid arterial disease    Past Surgical History  Procedure Laterality Date  . Appendectomy    . Cholecystectomy    . Total abdominal hysterectomy    . Cardiac catheterization  2008    no significant CAD  . Cardiac catheterization  04/2011    No significant CAD, moderate mitral stenosis (mean gradient: 12 mm Hg, MVA: 1.83), moderate pulmonary hypertension (PVR: 2.4 Woods units), Normal LVEDP.   . Cataract extraction Right   . Mastectomy Right   . Placement of breast implants  s/p mastectomy     Current Outpatient Prescriptions  Medication Sig Dispense Refill  . acetaminophen (TYLENOL) 325 MG tablet Take 650 mg by mouth every 6 (six) hours as needed for pain    . AZOPT 1 % ophthalmic suspension Place 1 drop into both eyes Twice daily.    . furosemide (LASIX) 20 MG tablet TAKE 1 TABLET BY MOUTH DAILY 90 tablet 0  . meclizine (ANTIVERT) 25 MG tablet Take 25 mg by mouth 2 (two) times daily as needed for dizziness or nausea.     . metoprolol tartrate (LOPRESSOR) 25 MG tablet TAKE 1/2 TABLET BY MOUTH TWICE DAILY(IF HEART RATE IS 50 OR GREATER)  (Patient taking differently: TAKE 1 TABLET BY MOUTH TWICE DAILY(IF HEART RATE IS 50 OR GREATER)) 90 tablet 3  . nitroGLYCERIN (NITROSTAT) 0.4 MG SL tablet Place 0.4 mg under the tongue every 5 (five) minutes as needed for chest pain.    Marland Kitchen ondansetron (ZOFRAN) 4 MG tablet Take 1 tablet (4 mg total) by mouth every 8 (eight) hours as needed for nausea or vomiting. 20 tablet 0  . propafenone (RYTHMOL) 225 MG tablet TAKE 1 TABLET BY MOUTH BY MOUTH EVERY 8 HOURS 270 tablet 3  . XARELTO 20 MG TABS tablet TAKE 1 TABLET BY MOUTH DAILY 90 tablet 3  . amoxicillin (AMOXIL) 500 MG capsule Take 2 capsules by mouth 2 (two) times daily.  0   No current facility-administered medications for this visit.    Allergies:   Amoxicillin-pot clavulanate; Lidocaine; Amiodarone; Darvon; Levofloxacin; Nitrofuran derivatives; Pentazocine lactate; Procaine hcl; Septra; Clodronic acid; Epinephrine; and Propoxyphene   Social History:  The patient  reports that she quit smoking about 31 years ago. Her smoking use included Cigarettes. She has a 3 pack-year smoking history. She has never used smokeless tobacco. She reports that she does not drink alcohol or use illicit drugs.   Family History:  The patient's  family history includes CAD in her sister.    ROS:  Please see the history of present illness.   All other systems are reviewed and negative.    PHYSICAL EXAM: VS:  BP 114/78 mmHg  Pulse 105  Ht 5\' 8"  (1.727 m)  Wt 96.671 kg (213 lb 1.9 oz)  BMI 32.41 kg/m2 , BMI Body mass index is 32.41 kg/(m^2). GEN: Well nourished, well developed, in no acute distress HEENT: normal Neck: no JVD, carotid bruits, or masses Cardiac:tachycardic irregular rhythm, difficult to appreciate murmurs today with tachycardia,no edema  Respiratory:  clear to auscultation bilaterally, normal work of breathing GI: soft, nontender, nondistended, + BS MS: no deformity or atrophy Skin: warm and dry  Neuro:  Strength and sensation are  intact Psych: euthymic mood, full affect  EKG:  EKG is ordered today. The ekg ordered today shows afib, V rate 105 bpm, LAHB,  Prior ekgs in sinus reveal QTc <440   Recent Labs: 05/16/2014: HGB 14.0; Platelet 195 12/08/2014: TSH 1.830 02/27/2015: BUN 26; Creatinine, Ser 1.08*; Potassium 4.3; Sodium 139    Lipid Panel  No results found for: CHOL, TRIG, HDL, CHOLHDL, VLDL, LDLCALC, LDLDIRECT   Wt Readings from Last 3 Encounters:  04/08/15 96.671 kg (213 lb 1.9 oz)  03/02/15 98.431 kg (217 lb)  02/27/15 98.431 kg (217 lb)      Other studies Reviewed: Additional studies/ records that were reviewed today include: Taylor Jacklynn Ganong notes, echos 6/16  Review of the above records today demonstrates: EF normal, moderate MS, severe PHTN, moderate to severe TR, moderate LA  enlargement, moderate to severe RA enlargement   ASSESSMENT AND PLAN:  1.  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  I think that our ability to manage afib at this time is quite low.  I have spoken with Taylor Taylor Weber and we agree that the best next step would be to better characterize her valvular heart disease.  Will proceed with TEE and RHC/LHC at this time.  Risks associated with the procedure discussed with patient who wishes to proceed.  Hold xarelto for 24 hours prior to the procedure. Will refer to Taylor Roxy Manns for assessment afterwards.  I would anticipate possible mitral valve repair with maze +/- tricuspid repair if feasible may be her best option.  She is a poor candidate for ablation. Given valvular afib, would consider switching from xarelto to coumadin.  I discussed this with the patient today.  She would like to discuss further with Taylor Taylor Weber.  Current medicines are reviewed at length with the patient today.   The patient does not have concerns regarding her medicines.  The following changes were made today:  none    Signed, Taylor Grayer, MD  04/08/2015 9:06 AM      [facility] [address] [place] [address] [phone] (office) [phone] (fax)

## 2015-04-15 NOTE — Discharge Instructions (Signed)
Resume Xarelto tomorrow evening.  Angiogram, Care After Refer to this sheet in the next few weeks. These instructions provide you with information on caring for yourself after your procedure. Your health care provider may also give you more specific instructions. Your treatment has been planned according to current medical practices, but problems sometimes occur. Call your health care provider if you have any problems or questions after your procedure.  WHAT TO EXPECT AFTER THE PROCEDURE After your procedure, it is typical to have the following sensations:  Minor discomfort or tenderness and a small bump at the catheter insertion site. The bump should usually decrease in size and tenderness within 1 to 2 weeks.  Any bruising will usually fade within 2 to 4 weeks. HOME CARE INSTRUCTIONS   You may need to keep taking blood thinners if they were prescribed for you. Take medicines only as directed by your health care provider.  Do not apply powder or lotion to the site.  Do not take baths, swim, or use a hot tub until your health care provider approves.  You may shower 24 hours after the procedure. Remove the bandage (dressing) and gently wash the site with plain soap and water. Gently pat the site dry.  Inspect the site at least twice daily.  Limit your activity for the first 48 hours. Do not bend, squat, or lift anything over 20 lb (9 kg) or as directed by your health care provider.  Plan to have someone take you home after the procedure. Follow instructions about when you can drive or return to work. SEEK MEDICAL CARE IF:  You get light-headed when standing up.  You have drainage (other than a small amount of blood on the dressing).  You have chills.  You have a fever.  You have redness, warmth, swelling, or pain at the insertion site. SEEK IMMEDIATE MEDICAL CARE IF:   You develop chest pain or shortness of breath, feel faint, or pass out.  You have bleeding, swelling larger  than a walnut, or drainage from the catheter insertion site.  You develop pain, discoloration, coldness, or severe bruising in the leg or arm that held the catheter.  You develop bleeding from any other place, such as the bowels. You may see bright red blood in your urine or stools, or your stools may appear black and tarry.  You have heavy bleeding from the site. If this happens, hold pressure on the site. CALL 911 MAKE SURE YOU:  Understand these instructions.  Will watch your condition.  Will get help right away if you are not doing well or get worse. Document Released: 02/10/2005 Document Revised: 12/09/2013 Document Reviewed: 12/17/2012 [facility] Patient Information 2015 [place], [place]. This information is not intended to replace advice given to you by your health care provider. Make sure you discuss any questions you have with your health care provider.

## 2015-04-16 ENCOUNTER — Encounter (HOSPITAL_COMMUNITY): Payer: Self-pay | Admitting: Cardiovascular Disease

## 2015-04-16 ENCOUNTER — Encounter: Payer: Medicare Other | Admitting: Thoracic Surgery (Cardiothoracic Vascular Surgery)

## 2015-04-17 ENCOUNTER — Institutional Professional Consult (permissible substitution) (INDEPENDENT_AMBULATORY_CARE_PROVIDER_SITE_OTHER): Payer: Medicare Other | Admitting: Thoracic Surgery (Cardiothoracic Vascular Surgery)

## 2015-04-17 ENCOUNTER — Encounter: Payer: Self-pay | Admitting: Thoracic Surgery (Cardiothoracic Vascular Surgery)

## 2015-04-17 VITALS — BP 110/80 | HR 80 | Resp 16 | Ht 68.5 in | Wt 213.0 lb

## 2015-04-17 DIAGNOSIS — I481 Persistent atrial fibrillation: Secondary | ICD-10-CM

## 2015-04-17 DIAGNOSIS — I071 Rheumatic tricuspid insufficiency: Secondary | ICD-10-CM | POA: Diagnosis not present

## 2015-04-17 DIAGNOSIS — I4819 Other persistent atrial fibrillation: Secondary | ICD-10-CM

## 2015-04-17 DIAGNOSIS — I5032 Chronic diastolic (congestive) heart failure: Secondary | ICD-10-CM

## 2015-04-17 DIAGNOSIS — I050 Rheumatic mitral stenosis: Secondary | ICD-10-CM | POA: Insufficient documentation

## 2015-04-17 DIAGNOSIS — I099 Rheumatic heart disease, unspecified: Secondary | ICD-10-CM

## 2015-04-17 NOTE — Patient Instructions (Signed)
Continue all previous medications without any changes at this time  

## 2015-04-17 NOTE — Progress Notes (Signed)
[name]       [name] [address]             [place] REPORT  Referring Provider is Taylor Grayer, MD  Primary Cardiologist is Taylor Hampshire, MD PCP is [facility], [facility]-C  Chief Complaint  Patient presents with  . Mitral Stenosis    eval for MVR...CATH/TEE 04/15/15  . Shortness of Breath  . Chest Pain  . Atrial Fibrillation    HPI:  Patient is a 69 year old female with known history of rheumatic mitral stenosis and chronic persistent atrial fibrillation with chronic diastolic congestive heart failure who has been referred for surgical consultation to discuss treatment options for management of mitral stenosis and atrial fibrillation. The patient states that she was diagnosed with rheumatic fever at age 2. Ever since her childhood she has been told that she had a heart murmur. She did well clinically and reported no significant physical limitations until approximately 6 or 8 years ago when she first developed atrial fibrillation associated with intermittent chest discomfort and shortness of breath. She was initially followed by Dr. Dannielle Weber and at one point was referred to in [facility] in [place].  She has undergone numerous cardioversions in the past and been treated with numerous different medications. She was intolerant of amiodarone and has failed medical therapy with both flecainide and propafenone.  Initially she would maintain sinus rhythm following cardioversion for an extended period time, but after her most recent cardioversion on 02/21/2015 the patient reverted to atrial fibrillation within a week. Recently she has been anticoagulated using Xarelto although in the past she took warfarin for many years without any particular problems or complications.  The patient does not tolerate atrial fibrillation very well with fairly significant increase in symptoms of exertional shortness of breath,  fatigue, decreased exercise tolerance, orthopnea, and lower extremity edema. She was initially referred to Dr. Rayann Weber for possible ablation, but she is not felt to be a good candidate for ablation because of the presence of significant valvular heart disease with moderate mitral stenosis, pulmonary hypertension, and tricuspid regurgitation. Because the patient had recently failed another attempt at cardioversion, Dr. Rayann Weber stopped the patient's propafenone and referred her for surgical consultation. The patient does state that her symptoms have improved somewhat since she stopped taking propafenone.  However, she continues to experience exertional shortness of breath and fatigue that seems to wax and wane in severity, and at times limits her physical activities fairly dramatically.  The patient is married and lives with her husband in [name], [name].  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 She has 3 grown children and numerous grandchildren. She has remained physically active all of her life until recently. She is limited primarily by exertional shortness of breath and fatigue. She has palpitations and at times feels her heart racing as high as 140 bpm.  She sleeps on 3 pillows at night. She occasionally wakes up in the middle night feeling short of breath and has to sit up to catch her breath. She has had mild lower extremity edema. She has occasional brief episodes of atypical chest discomfort. She has not been having dizzy spells or syncope.  Past Medical History  Diagnosis Date  . Chest pain     normal coronary angiogram in december 2008  . Mild aortic regurgitation with left ventricular dilation by prior echocardiogram   . Atherosclerotic  cerebrovascular disease     nonobstructive  . Silicone leakage from breast implant   . Dyspnea     chronic exertional dyspnea related to intermittant atrial  . Carotid bruit     right   . Pneumonia   . Rheumatic  fever     as a child  . Breast cancer     remote right breast mastectomy,bilaterial implants  . Breast cancer     radical mastectomy status post billaterial breast implants and right breast implat rupture  . Diabetes mellitus     diet controlled  . Mitral stenosis     moderate  . 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 Persistent atrial fibrillation     post cardioversion maintain normal sinus rhythm  . Vertigo   . Carotid arterial disease   . Tricuspid regurgitation   . Rheumatic mitral stenosis   . Chronic diastolic congestive heart failure     Past Surgical History  Procedure Laterality Date  . Appendectomy    . Cholecystectomy    . Total abdominal hysterectomy    . Cardiac catheterization  2008    no significant CAD  . Cardiac catheterization  04/2011    No significant CAD, moderate mitral stenosis (mean gradient: 12 mm Hg, MVA: 1.83), moderate pulmonary hypertension (PVR: 2.4 [place] units), Normal LVEDP.   . Cataract extraction Right   . Mastectomy Right   . Placement of breast implants      s/p mastectomy  . Cardiac catheterization N/A 04/15/2015    Procedure: Right/Left Heart Cath and Coronary Angiography;  Surgeon: Taylor Hampshire, MD;  Location: [facility];  Service: Cardiovascular;  Laterality: N/A;  . Tee without cardioversion N/A 04/15/2015    Procedure: TRANSESOPHAGEAL ECHOCARDIOGRAM (TEE);  Surgeon: Dorothy Spark, MD;  Location: [facility] ENDOSCOPY;  Service: Cardiovascular;  Laterality: N/A;    Family History  Problem Relation Age of Onset  . CAD Sister     Social History   Social History  . Marital Status: Married    Spouse Name: N/A  . Number of Children: N/A  . Years of Education: N/A   Occupational History  .      lives in [place] History Main Topics  . Smoking status: Former Smoker -- 0.30 packs/day for 10 years    Types: Cigarettes    Quit date: 08/09/1983  . Smokeless tobacco: Never Used   . Alcohol Use: No  . Drug Use: No  . Sexual Activity: Not on file   Other Topics Concern  . Not on file   Social History Narrative   Lives in [place].   Retired Psychologist, counselling    Current Outpatient Prescriptions  Medication Sig Dispense Refill  . acetaminophen (TYLENOL) 325 MG tablet Take 650 mg by mouth every 6 (six) hours as needed for pain    . AZOPT 1 % ophthalmic suspension Place 1 drop into both eyes Twice daily.    . furosemide (LASIX) 20 MG tablet TAKE 1 TABLET BY MOUTH DAILY (Patient taking differently: TAKE 20 MG BY MOUTH DAILY) 90 tablet 0  . meclizine (ANTIVERT) 25 MG tablet Take 25 mg by mouth 2 (two) times daily as needed for dizziness or nausea.     . metoprolol tartrate (LOPRESSOR) 25 MG tablet Take 1 tablet (25 mg total) by mouth 2 (two) times daily. 90 tablet 3  . nitroGLYCERIN (NITROSTAT) 0.4 MG SL tablet Place 0.4  mg under the tongue every 5 (five) minutes as needed for chest pain.    Marland Kitchen ondansetron (ZOFRAN) 4 MG tablet Take 1 tablet (4 mg total) by mouth every 8 (eight) hours as needed for nausea or vomiting. 20 tablet 0  . XARELTO 20 MG TABS tablet TAKE 1 TABLET BY MOUTH DAILY (Patient taking differently: TAKE 20 MG BY MOUTH DAILY) 90 tablet 3   No current facility-administered medications for this visit.    Allergies  Allergen Reactions  . Amoxicillin-Pot Clavulanate Shortness Of Breath    AUGMENTIN  Other Reaction: GI Upset  . Lidocaine Shortness Of Breath and Palpitations  . Amiodarone Other (See Comments)    Alopecia  . Clodronic Acid Rash  . Darvon Rash  . Epinephrine Palpitations and Other (See Comments)    Pt is unsure of this reaction  . Levofloxacin Rash  . Nitrofuran Derivatives Rash and Other (See Comments)    (generic for Macrobid) Rash  . Pentazocine Lactate Rash  . Procaine Hcl Rash  . Propoxyphene Palpitations  . Septra [Sulfamethoxazole W/Trimethoprim (Co-Trimoxazole)] Rash      Review of Systems:   General:  normal  appetite, decreased energy, no weight gain, no weight loss, no fever  Cardiac:  no chest pain with exertion, occasional chest pain at rest, +SOB with exertion, no resting SOB, + PND, + orthopnea, + palpitations, + arrhythmia, + atrial fibrillation, + LE edema, no dizzy spells, no syncope  Respiratory:  + shortness of breath, no home oxygen, no productive cough, no dry cough, + recent bronchitis, no wheezing, no hemoptysis, no asthma, no pain with inspiration or cough, no sleep apnea, no CPAP at night  GI:   no difficulty swallowing, no reflux, no frequent heartburn, no hiatal hernia, no abdominal pain, no constipation, no diarrhea, no hematochezia, no hematemesis, no melena  GU:   no dysuria,  + frequency, no urinary tract infection, no hematuria, no kidney stones, no kidney disease  Vascular:  no pain suggestive of claudication, no pain in feet, no leg cramps, no varicose veins, no DVT, no non-healing foot ulcer  Neuro:   no stroke, no TIA's, no seizures, no headaches, no temporary blindness one eye,  no slurred speech, no peripheral neuropathy, no chronic pain, no instability of gait, very mild memory/cognitive dysfunction  Musculoskeletal: minimal arthritis, no joint swelling, no myalgias, no difficulty walking, normal mobility   Skin:   no rash, no itching, no skin infections, no pressure sores or ulcerations  Psych:   + anxiety, no depression, no nervousness, + unusual recent stress due to family issues  Eyes:   no blurry vision, no floaters, no recent vision changes, + wears glasses or contacts  ENT:   mild hearing loss, no loose or painful teeth, no dentures, last saw dentist within the past 6 months  Hematologic:  + easy bruising, no abnormal bleeding, no clotting disorder, no frequent epistaxis, no complications on long term anticoagulation therapy  Endocrine:  + diabetes, does occasionally check CBG's at home     Physical Exam:   BP 110/80 mmHg  Pulse 80  Resp 16  Ht 5' 8.5" (1.74 m)   Wt 213 lb (96.616 kg)  BMI 31.91 kg/m2  SpO2 99%  General:  Moderately obese  well-appearing  HEENT:  Unremarkable   Neck:   no JVD, no bruits, no adenopathy   Chest:   clear to auscultation, symmetrical breath sounds, no wheezes, no rhonchi , scarring from bilateral mastectomies  CV:   Irregular  rate and rhythm, no murmur   Abdomen:  soft, non-tender, no masses   Extremities:  warm, well-perfused, pulses palpable, no LE edema  Rectal/GU  Deferred  Neuro:   Grossly non-focal and symmetrical throughout  Skin:   Clean and dry, no rashes, no breakdown   Diagnostic Tests:  Transthoracic Echocardiography  Patient:  Taylor Weber, Taylor Weber MR #:    [id number] Study Date: 01/09/2015 Gender:   F Age:    36 Height:   172.7 cm Weight:   96.6 kg BSA:    2.18 m^2 Pt. Status: Room:  ATTENDING  Kathlyn Sacramento, MD ORDERING   Kathlyn Sacramento, MD [place], MD PERFORMING  [place] SONOGRAPHER Pilar Jarvis, RVT, RDCS, RDMS  cc:  ------------------------------------------------------------------- LV EF: 55% -  60%  ------------------------------------------------------------------- History:  PMH: History of rheumatic fever. Dyspnea. Atrial fibrillation. Mitral valve disease. Risk factors: Hypertension. Obese.  ------------------------------------------------------------------- Study Conclusions  - Left ventricle: The cavity size was normal. Wall thickness was normal. Systolic function was normal. The estimated ejection fraction was in the range of 55% to 60%. Wall motion was normal; there were no regional wall motion abnormalities. The study is not technically sufficient to allow evaluation of LV diastolic function. - Aortic valve: There was mild regurgitation. - Mitral valve: Morphological stenosis, consistent with rheumatic disease. The findings are consistent with moderate stenosis. Mean gradient (D): 3 mm  Hg. Valve area by pressure half-time: 1.21 cm^2. Valve area by continuity equation (using LVOT flow): 1.32 cm^2. - Left atrium: The atrium was moderately dilated. - Tricuspid valve: There was moderate-severe regurgitation. - Pulmonary arteries: Systolic pressure was severely increased. PA peak pressure: 75 mm Hg (S). - Inferior vena cava: The vessel was dilated. The respirophasic diameter changes were blunted (< 50%), consistent with elevated central venous pressure.  ------------------------------------------------------------------- Labs, prior tests, procedures, and surgery: Echocardiography (July 2015).   EF was 60%.  Transthoracic echocardiography. M-mode, complete 2D, spectral Doppler, and color Doppler. Birthdate: Patient birthdate: 13-Mar-1946. Age: Patient is 69 yr old. Sex: Gender: female. BMI: 32.4 kg/m^2. Blood pressure:   142/76 Patient status: Outpatient. Study date: Study date: 01/09/2015. Study time: 11:20 AM.  -------------------------------------------------------------------  ------------------------------------------------------------------- Left ventricle: The cavity size was normal. Wall thickness was normal. Systolic function was normal. The estimated ejection fraction was in the range of 55% to 60%. Wall motion was normal; there were no regional wall motion abnormalities. The study is not technically sufficient to allow evaluation of LV diastolic function.  ------------------------------------------------------------------- Aortic valve:  Trileaflet; normal thickness leaflets. Mobility was not restricted. Doppler: Transvalvular velocity was within the normal range. There was no stenosis. There was mild regurgitation.  ------------------------------------------------------------------- Aorta: Aortic root: The aortic root was normal in size.  ------------------------------------------------------------------- Mitral valve:   Mildly thickened leaflets . Morphological stenosis, consistent with rheumatic disease. Doppler:  The findings are consistent with moderate stenosis.  There was no regurgitation.  Valve area by pressure half-time: 1.21 cm^2. Indexed valve area by pressure half-time: 0.55 cm^2/m^2. Valve area by continuity equation (using LVOT flow): 1.32 cm^2. Indexed valve area by continuity equation (using LVOT flow): 0.6 cm^2/m^2. Mean gradient (D): 3 mm Hg. Peak gradient (D): 13 mm Hg.  ------------------------------------------------------------------- Left atrium: The atrium was moderately dilated.  ------------------------------------------------------------------- Right ventricle: The cavity size was normal. Wall thickness was normal. Systolic function was normal.  ------------------------------------------------------------------- Pulmonic valve:  Doppler: Transvalvular velocity was within the normal range. There was no evidence for stenosis.  ------------------------------------------------------------------- Tricuspid valve:  Structurally normal valve.  Doppler:  Transvalvular velocity was within the normal range. There was moderate-severe regurgitation.  ------------------------------------------------------------------- Pulmonary artery:  The main pulmonary artery was normal-sized. Systolic pressure was severely increased.  ------------------------------------------------------------------- Right atrium: The atrium was normal in size.  ------------------------------------------------------------------- Pericardium: There was no pericardial effusion.  ------------------------------------------------------------------- Systemic veins: Inferior vena cava: The vessel was dilated. The respirophasic diameter changes were blunted (< 50%), consistent with elevated central venous  pressure.  ------------------------------------------------------------------- Measurements  Left ventricle              Value     Reference LV ID, ED, PLAX chordal          52  mm    43 - 52 LV ID, ES, PLAX chordal      (H)   42.8 mm    23 - 38 LV fx shortening, PLAX chordal  (L)   18  %    >=29 LV PW thickness, ED            8.06 mm    --------- IVS/LV PW ratio, ED            1.23      <=1.3 Stroke volume, 2D             107  ml    --------- Stroke volume/bsa, 2D           49  ml/m^2  ---------  Ventricular septum            Value     Reference IVS thickness, ED             9.9  mm    ---------  LVOT                   Value     Reference LVOT ID, S                21  mm    --------- LVOT area                 3.46 cm^2   --------- LVOT ID                  21  mm    --------- LVOT peak velocity, S           131  cm/s   --------- LVOT mean velocity, S           84.4 cm/s   --------- LVOT VTI, S                30.9 cm    --------- LVOT peak gradient, S           7   mm Hg  --------- Stroke volume (SV), LVOT DP        107  ml    --------- Stroke index (SV/bsa), LVOT DP      49  ml/m^2  ---------  Aorta                   Value     Reference Aortic root ID, ED            28  mm    --------- Ascending aorta ID, A-P, S        29  mm    ---------  Left atrium                Value     Reference LA ID, A-P, ES  40  mm    --------- LA ID/bsa, A-P              1.83 cm/m^2  <=2.2 LA volume, S               93  ml     --------- LA volume/bsa, S             42.6 ml/m^2  --------- LA volume, ES, 1-p A4C          85  ml    --------- LA volume/bsa, ES, 1-p A4C        38.9 ml/m^2  --------- LA volume, ES, 1-p A2C          98  ml    --------- LA volume/bsa, ES, 1-p A2C        44.9 ml/m^2  ---------  Mitral valve               Value     Reference Mitral E-wave peak velocity        179  cm/s   --------- Mitral A-wave peak velocity        81  cm/s   --------- Mitral mean velocity, D          85.2 cm/s   --------- Mitral deceleration time     (H)   306  ms    150 - 230 Mitral pressure half-time         182  ms    --------- Mitral mean gradient, D          3   mm Hg  --------- Mitral peak gradient, D          13  mm Hg  --------- Mitral E/A ratio, peak          2.2      --------- Mitral valve area, PHT, DP        1.21 cm^2   --------- Mitral valve area/bsa, PHT, DP      0.55 cm^2/m^2 --------- Mitral valve area, LVOT          1.32 cm^2   --------- continuity Mitral valve area/bsa, LVOT        0.6  cm^2/m^2 --------- continuity Mitral annulus VTI, D           81.2 cm    ---------  Pulmonary arteries            Value     Reference PA pressure, S, DP        (H)   75  mm Hg  <=30  Tricuspid valve              Value     Reference Tricuspid maximal inflow         384  cm/s   --------- velocity, PISA  Legend: (L) and (H) mark values outside specified reference range.  ------------------------------------------------------------------- Prepared and Electronically Authenticated by  Kathlyn Sacramento, MD 2016-06-03T18:11:09   Transesophageal Echocardiography  Patient:  Taylor Weber, Taylor Weber MR #:    [id number] Study Date: 04/15/2015 Gender:   F Age:    56 Height:   172.7 cm Weight:   96.8 kg BSA:    2.19 m^2 Pt. Status: Room:  SONOGRAPHER Florentina Jenny, RDCS ORDERING   Taylor Grayer, MD REFERRING  Taylor Grayer, MD ADMITTING  Ena Dawley, M.D. ATTENDING  Ena Dawley, M.D. PERFORMING  Ena Dawley, M.D.  cc:  ------------------------------------------------------------------- LV EF: 50% -  55%  ------------------------------------------------------------------- Indications:   Atrial  fibrillation - 427.31.  ------------------------------------------------------------------- History:  PMH:  Mitral valve disease.  ------------------------------------------------------------------- Study Conclusions  - Left ventricle: There was mild concentric hypertrophy. Systolic function was normal. The estimated ejection fraction was in the range of 50% to 55%. Wall motion was normal; there were no regional wall motion abnormalities. - Aortic valve: There was mild to moderate regurgitation. - Mitral valve: Mitral valve leaflets are severely thickened predominantly the anterior leaflet. There is restricted leaflet opening in a typical rheumatic pattern. There is associated moderate mitral stenosis with mean gradient 8 mmHg and only trivial mitral regurgitation. - Left atrium: The atrium was moderately dilated. No evidence of thrombus in the atrial cavity or appendage. There is significant smoke. No evidence of thrombus in the atrial cavity or appendage. - Right ventricle: The cavity size was moderately dilated. Wall thickness was normal. - Right atrium: The atrium was severely dilated. No evidence of thrombus in the atrial cavity or appendage. - Tricuspid valve: There was severe regurgitation. - Pulmonary arteries: Systolic pressure was severely increased. PA peak pressure: 67 mm Hg  (S).  Diagnostic transesophageal echocardiography. 2D and color Doppler. Birthdate: Patient birthdate: 1945-09-09. Age: Patient is 69 yr old. Sex: Gender: female.  BMI: 32.5 kg/m^2. Blood pressure: 144/106 Patient status: Outpatient. Study date: Study date: 04/15/2015. Study time: 10:07 AM. Location: Endoscopy.  -------------------------------------------------------------------  ------------------------------------------------------------------- Left ventricle: There was mild concentric hypertrophy. Systolic function was normal. The estimated ejection fraction was in the range of 50% to 55%. Wall motion was normal; there were no regional wall motion abnormalities.  ------------------------------------------------------------------- Aortic valve:  Structurally normal valve. Trileaflet; normal thickness leaflets. Cusp separation was normal. Doppler: There was mild to moderate regurgitation.  ------------------------------------------------------------------- Aorta: There was no atheroma. There was no evidence for dissection. Aortic root: The aortic root was not dilated. Ascending aorta: The ascending aorta was normal in size. Aortic arch: The aortic arch was normal in size. Descending aorta: The descending aorta was normal in size.  ------------------------------------------------------------------- Mitral valve: Mitral valve leaflets are severely thickened predominantly the anterior leaflet. There is restricted leaflet opening in a typical rheumatic pattern. There is associated moderate mitral stenosis with mean gradient 8 mmHg and only trivial mitral regurgitation. Structurally normal valve.  Leaflet separation was normal. Doppler: There was no significant regurgitation.  Mean gradient (D): 7 mm Hg.  ------------------------------------------------------------------- Left atrium: The atrium was moderately dilated. No evidence of thrombus in the  atrial cavity or appendage. There is significant smoke. No evidence of thrombus in the atrial cavity or appendage. The appendage was morphologically a left appendage, multilobulated, and of normal size. Emptying velocity was normal.  ------------------------------------------------------------------- Right ventricle: The cavity size was moderately dilated. Wall thickness was normal. Systolic function was normal.  ------------------------------------------------------------------- Pulmonic valve:  Structurally normal valve.  Doppler: There was trivial regurgitation.  ------------------------------------------------------------------- Tricuspid valve:  Structurally normal valve.  Leaflet separation was normal. Doppler: There was severe regurgitation.  ------------------------------------------------------------------- Pulmonary artery:  The main pulmonary artery was normal-sized. Systolic pressure was severely increased.  ------------------------------------------------------------------- Right atrium: The atrium was severely dilated. No evidence of thrombus in the atrial cavity or appendage. The appendage was morphologically a right appendage.  ------------------------------------------------------------------- Pericardium: There was no pericardial effusion.  ------------------------------------------------------------------- Measurements  Mitral valve             Value    Reference Mitral mean velocity, D        112  cm/s --------- Mitral mean gradient, D        7  mm Hg --------- Mitral annulus VTI, D         43.2 cm  ---------  Pulmonary arteries          Value    Reference PA pressure, S, DP       (H)  67  mm Hg <=30  Tricuspid valve            Value    Reference Tricuspid regurg peak velocity    399  cm/s --------- Tricuspid peak RV-RA gradient     64   mm Hg ---------  Legend: (L) and (H) mark values outside specified reference range.  ------------------------------------------------------------------- Prepared and Electronically Authenticated by  Ena Dawley, M.D. 2016-09-07T19:03:44   CARDIAC CATHETERIZATION  Procedures    Right/Left Heart Cath and Coronary Angiography    PACS Images    Show images for Cardiac catheterization     Link to Procedure Log    Procedure Log      Indications    Mitral valve disease [I05.9 (ICD-10-CM)]    Technique and Indications    Procedural Details: The right groin was prepped, draped, and anesthetized with 1% lidocaine. Ultrasound guidance was used for access due to weak pulse and scar tissue. Using the modified Seldinger technique a 5 French sheath was placed in the right femoral artery and a 7 French sheath was placed in the right femoral vein. A Swan-Ganz catheter was used for the right heart catheterization. Standard protocol was followed for recording of right heart pressures and sampling of oxygen saturations. Fick cardiac output was calculated. Standard Judkins catheters were used for selective coronary angiography and left ventriculography. There were no immediate procedural complications. The patient was transferred to the post catheterization recovery area for further monitoring. Estimated blood loss <50 mL. There were no immediate complications during the procedure.    Conclusion     The left ventricular systolic function is normal.  1. Normal coronary arteries. 2. Low normal LV systolic function with an ejection fraction of 50-55%. 3. Moderate mitral valve stenosis with a mean gradient of 10.6 mmHg and valve area of 1.38 cm. 4. Moderate pulmonary hypertension with a pulmonary pressure of 46 over 18 mmHg and pulmonary vascular resistance of 2.8 Woods units.  Recommendations: The mitral stenosis is not severe and has been stable since most recent evaluation in  2012. However, the patient might benefit from mitral valve repair//replacement with surgical maze procedure and tricuspid valve repair. The alternative would be to treat with rate control for atrial fibrillation and anticoagulation. The patient reports improvement in symptoms since propafenone was discontinued.     Coronary Findings    Dominance: Right   Left Main  The vessel is angiographically normal.     Left Anterior Descending  The vessel exhibits minimal luminal irregularities.   . First Diagonal Branch   The vessel is small in size and is angiographically normal.   . Second Diagonal Branch   The vessel exhibits minimal luminal irregularities.   . Third Diagonal Branch   The vessel is small in size and exhibits minimal luminal irregularities.     Left Circumflex  The vessel is angiographically normal.   . First Obtuse Marginal Branch   The vessel is small in size and is angiographically normal.   . Second Obtuse Marginal Branch   The vessel is small in size and is angiographically normal.   . Third Obtuse Marginal Branch   The vessel is angiographically normal.     Right Coronary Artery  The vessel is angiographically normal.   . Right Posterior Descending Artery   The vessel is moderate in size and is angiographically normal.   . Right Posterior Atrioventricular Branch   The vessel is angiographically normal.   . First Right Posterolateral   The vessel is small in size and is angiographically normal.   . Second Right Posterolateral   The vessel is angiographically normal.   . Third Right Posterolateral   The vessel is angiographically normal.       Right Heart Pressures Hemodynamic findings consistent with mild pulmonary hypertension and mitral valve stenosis. LV EDP is normal.    Wall Motion                 Left Heart    Left Ventricle The left ventricular size is normal. The left ventricular systolic function is normal. There are no wall  motion abnormalities in the left ventricle.   Mitral Valve There is moderate mitral valve stenosis and mild (2+) mitral regurgitation.   Aortic Valve There is no aortic valve stenosis.    Coronary Diagrams    Diagnostic Diagram            Implants    Name ID Temporary Type Supply   No information to display    Hemo Data       Most Recent Value   Fick Cardiac Output  4.29 L/min   Fick Cardiac Output Index  2.04 (L/min)/BSA   Mitral Mean Gradient  10.6 mmHg   Mitral Peak Gradient  7 mmHg   Mitral Valve Area Index  0.66 cm2/BSA   RA A Wave  -99 mmHg   RA V Wave  15 mmHg   RA Mean  9 mmHg   RV Systolic Pressure  48 mmHg   RV Diastolic Pressure  2 mmHg   RV EDP  5 mmHg   PA Systolic Pressure  46 mmHg   PA Diastolic Pressure  18 mmHg   PA Mean  29 mmHg   PW A Wave  -99 mmHg   PW V Wave  23 mmHg   PW Mean  17 mmHg   LV Systolic Pressure  [id number] mmHg   LV Diastolic Pressure  1 mmHg   LV EDP  7 mmHg   Arterial Occlusion Pressure Extended Systolic Pressure  123456 mmHg   Arterial Occlusion Pressure Extended Diastolic Pressure  63 mmHg   Arterial Occlusion Pressure Extended Mean Pressure  84 mmHg   Left Ventricular Apex Extended Systolic Pressure  [id number] mmHg   Left Ventricular Apex Extended Diastolic Pressure  1 mmHg   Left Ventricular Apex Extended EDP Pressure  8 mmHg   QP/QS  1   TPVR Index  14.2 HRUI     Impression:  Patient has long-standing rheumatic heart disease with moderate mitral stenosis, chronic persistent atrial fibrillation, pulmonary hypertension, and tricuspid regurgitation. In the past she has done well on medical therapy, but more recently she has experienced fairly significant exacerbation of symptoms of chronic diastolic congestive heart failure associated with recurrent persistent atrial fibrillation that has now failed medical therapy and numerous attempts at DC cardioversion. I have personally reviewed the patient's  recent transthoracic and transesophageal echocardiograms and diagnostic cardiac catheterization. The patient has classical features of long-standing rheumatic heart disease with moderate mitral stenosis, severe left and right atrial enlargement, pulmonary hypertension, and moderate to severe tricuspid regurgitation. Left ventricular size and systolic function remains preserved. The patient additionally has rheumatic disease involving the aortic valve  with mild aortic insufficiency.  Options include surgical intervention versus continued medical therapy with long-term anticoagulation, rate control, and direct therapy for management of congestive heart failure.  The patient does not have severe mitral stenosis, so balloon mitral valvuloplasty would not likely be beneficial and continued medical therapy remains a reasonable alternative.  However, the patient does not tolerate atrial fibrillation very well and she has developed significant pulmonary hypertension, right ventricular chamber enlargement, and secondary tricuspid regurgitation.  Moreover, the likelihood that she would maintain sinus rhythm following mitral valve replacement and Maze procedure will gradually diminish the longer she remains in chronic persistent atrial fibrillation.  Surgical intervention would imply the need for mitral valve replacement as I do not feel that mitral valve repair would provide a durable result and might be associated with persistence of at least mild mitral stenosis.  Although the patient might be an acceptable candidate for minimally invasive approach for surgery, she has had complications related to bilateral mastectomy and breast reconstruction causing significant scar tissue on both sides.    Plan:  I discussed options at length with the patient and her daughter in the office today. The indications, risks, and potential benefits of mitral valve replacement, tricuspid valve repair, and maze procedure had been reviewed.   Long-term prognosis with continued medical therapy has been discussed.  We discussed the possibility of replacing her mitral valve using a mechanical prosthesis with the attendant need for long-term anticoagulation versus the alternative of replacing it using a bioprosthetic tissue valve with its potential for late structural valve deterioration and failure, depending upon the patient's longevity.  Expectations for the patient's postoperative convalescence following surgery have been reviewed. All of her questions been addressed. At this point the patient wants to see how she does on medical therapy for a brief period of time since she seems to have experienced some degree of improvement once she stopped taking propafenone.  She will return in 3 months to see how she is doing. During the interim period of time she will continue to follow-up with Dr. Fletcher Anon.   I spent in excess of 90 minutes during the conduct of this office consultation and >50% of this time involved direct face-to-face encounter with the patient for counseling and/or coordination of their care.    Valentina Gu. Roxy Manns, MD 04/17/2015 12:23 PM

## 2015-04-20 ENCOUNTER — Telehealth: Payer: Self-pay | Admitting: Cardiovascular Disease

## 2015-04-20 NOTE — Telephone Encounter (Signed)
Left message on machine for patient to contact the office.   

## 2015-04-20 NOTE — Telephone Encounter (Signed)
Patient saw Dr. Ricard Dillon and needs valve repair in the future .  Patient was told to call Fletcher Anon and ask for something to lower heart rate.  Please call patient .

## 2015-04-21 ENCOUNTER — Other Ambulatory Visit: Payer: Self-pay

## 2015-04-21 MED ORDER — DIGOXIN 125 MCG PO TABS: 0.1250 mg | ORAL_TABLET | Freq: Every day | ORAL | Status: DC

## 2015-04-21 NOTE — Telephone Encounter (Signed)
S/w pt who states Dr. Ricard Dillon told her she might need more than metoprolol 25mg  BID to control HR. Has 3 month f/u with Ricard Dillon to discuss surgery. States he told her to contact Dr. Fletcher Anon. Reports HR 60s last night but elevated if doing housework or anxious. States she lives stressful life and this is when HR increases. Would like something PRN for elevated HR. Forward to [place] for review.

## 2015-04-21 NOTE — Telephone Encounter (Signed)
S/w pt regarding recommendations of digoxin. Pt verbalized understanding to take every day. Prescription sent to [facility] Confirmed 9/27 appointment w/Dr. Fletcher Anon.

## 2015-04-21 NOTE — Telephone Encounter (Signed)
Add Digoxin 0.125 mg once daily. This is in addition to Metoprolol and not prn. I think this combination will work better.

## 2015-05-01 DIAGNOSIS — Z1231 Encounter for screening mammogram for malignant neoplasm of breast: Secondary | ICD-10-CM | POA: Diagnosis not present

## 2015-05-05 ENCOUNTER — Ambulatory Visit: Payer: Medicare Other | Admitting: Cardiovascular Disease

## 2015-05-11 ENCOUNTER — Ambulatory Visit (INDEPENDENT_AMBULATORY_CARE_PROVIDER_SITE_OTHER): Payer: Medicare Other | Admitting: Cardiovascular Disease

## 2015-05-11 ENCOUNTER — Encounter: Payer: Self-pay | Admitting: Cardiovascular Disease

## 2015-05-11 VITALS — BP 140/82 | HR 77 | Ht 68.0 in | Wt 212.5 lb

## 2015-05-11 DIAGNOSIS — I050 Rheumatic mitral stenosis: Secondary | ICD-10-CM

## 2015-05-11 DIAGNOSIS — I4819 Other persistent atrial fibrillation: Secondary | ICD-10-CM

## 2015-05-11 DIAGNOSIS — I4891 Unspecified atrial fibrillation: Secondary | ICD-10-CM

## 2015-05-11 DIAGNOSIS — I481 Persistent atrial fibrillation: Secondary | ICD-10-CM | POA: Diagnosis not present

## 2015-05-11 DIAGNOSIS — I5032 Chronic diastolic (congestive) heart failure: Secondary | ICD-10-CM | POA: Diagnosis not present

## 2015-05-11 MED ORDER — DIGOXIN 125 MCG PO TABS: 0.1250 mg | ORAL_TABLET | Freq: Every day | ORAL | Status: DC

## 2015-05-11 MED ORDER — METOPROLOL TARTRATE 25 MG PO TABS: 25.0000 mg | ORAL_TABLET | Freq: Three times a day (TID) | ORAL | Status: DC

## 2015-05-11 NOTE — Assessment & Plan Note (Signed)
She appears to be euvolemic on small dose furosemide.

## 2015-05-11 NOTE — Progress Notes (Signed)
HPI  This is a 69 year old female who is here today for followup visit. She has persistent atrial fibrillation . She is on anticoagulation with Xarelto without any reported side effects. In the past when she was on warfarin she had significant difficulty in maintaining therapeutic INR. 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 No coronary artery disease on previous cardiac catheterization. She has been on multiple antiarrhythmics medications most recently Rythmol. She has required multiple cardioversions most recently in October of last year.  She had recurrent atrial fibrillation after recent cardioversion. Thus, I referred her to see Dr. Rayann Heman. The recommendation was to consider mitral valve surgery plus surgical maze. I proceeded with a right and left cardiac catheterization which showed normal coronary arteries, low normal LV systolic function, moderate mitral valve stenosis and moderate pulmonary hypertension. She saw Dr. Roxy Manns to discuss the surgery. It appears that the patient felt better after propafenone was discontinued. She has been doing better with rate control without antiarrhythmics medication. She continues to have exertional palpitations. I added small dose digoxin. She reports mild visual changes which might be related to digoxin.she reports that her dyspnea has improved.   Allergies  Allergen Reactions  . Amoxicillin-Pot Clavulanate Shortness Of Breath    AUGMENTIN  Other Reaction: GI Upset  . Lidocaine Shortness Of Breath and Palpitations  . Amiodarone Other (See Comments)    Alopecia  . Clodronic Acid Rash  . Darvon Rash  . Epinephrine Palpitations and Other (See Comments)    Pt is unsure of this reaction  . Levofloxacin Rash  . Nitrofuran Derivatives Rash and Other (See Comments)    (generic for Macrobid) Rash  . Pentazocine Lactate Rash  . Procaine Hcl Rash  . Propoxyphene Palpitations  . Septra  [Sulfamethoxazole W/Trimethoprim (Co-Trimoxazole)] Rash     Current Outpatient Prescriptions on File Prior to Visit  Medication Sig Dispense Refill  . acetaminophen (TYLENOL) 325 MG tablet Take 650 mg by mouth every 6 (six) hours as needed for pain    . AZOPT 1 % ophthalmic suspension Place 1 drop into both eyes Twice daily.    . digoxin (LANOXIN) 0.125 MG tablet Take 1 tablet (0.125 mg total) by mouth daily. 30 tablet 0  . furosemide (LASIX) 20 MG tablet TAKE 1 TABLET BY MOUTH DAILY (Patient taking differently: TAKE 20 MG BY MOUTH DAILY) 90 tablet 0  . meclizine (ANTIVERT) 25 MG tablet Take 25 mg by mouth 2 (two) times daily as needed for dizziness or nausea.     . metoprolol tartrate (LOPRESSOR) 25 MG tablet Take 1 tablet (25 mg total) by mouth 2 (two) times daily. 90 tablet 3  . nitroGLYCERIN (NITROSTAT) 0.4 MG SL tablet Place 0.4 mg under the tongue every 5 (five) minutes as needed for chest pain.    Marland Kitchen ondansetron (ZOFRAN) 4 MG tablet Take 1 tablet (4 mg total) by mouth every 8 (eight) hours as needed for nausea or vomiting. 20 tablet 0  . XARELTO 20 MG TABS tablet TAKE 1 TABLET BY MOUTH DAILY (Patient taking differently: TAKE 20 MG BY MOUTH DAILY) 90 tablet 3   No current facility-administered medications on file prior to visit.     Past Medical History  Diagnosis Date  . Chest pain     normal coronary angiogram in december 2008  . Mild aortic regurgitation with left ventricular dilation by prior echocardiogram   . Atherosclerotic cerebrovascular disease     nonobstructive  .  Silicone leakage from breast implant   . Dyspnea     chronic exertional dyspnea related to intermittant atrial  . Carotid bruit     right   . Pneumonia   . Rheumatic fever     as a child  . Breast cancer ([place])     remote right breast mastectomy,bilaterial implants  . Breast cancer ([place])     radical mastectomy status post billaterial breast implants and right breast implat rupture  . Diabetes mellitus      diet controlled  . Mitral stenosis     moderate  . 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 Persistent atrial fibrillation ([place])     post cardioversion maintain normal sinus rhythm  . Vertigo   . Carotid arterial disease ([place])   . Tricuspid regurgitation   . Rheumatic mitral stenosis   . Chronic diastolic congestive heart failure [facility])      Past Surgical History  Procedure Laterality Date  . Appendectomy    . Cholecystectomy    . Total abdominal hysterectomy    . Cardiac catheterization  2008    no significant CAD  . Cardiac catheterization  04/2011    No significant CAD, moderate mitral stenosis (mean gradient: 12 mm Hg, MVA: 1.83), moderate pulmonary hypertension (PVR: 2.4 Woods units), Normal LVEDP.   . Cataract extraction Right   . Mastectomy Right   . Placement of breast implants      s/p mastectomy  . Cardiac catheterization N/A 04/15/2015    Procedure: Right/Left Heart Cath and Coronary Angiography;  Surgeon: Wellington Hampshire, MD;  Location: [facility];  Service: Cardiovascular;  Laterality: N/A;  . Tee without cardioversion N/A 04/15/2015    Procedure: TRANSESOPHAGEAL ECHOCARDIOGRAM (TEE);  Surgeon: Dorothy Spark, MD;  Location[facility] ENDOSCOPY;  Service: Cardiovascular;  Laterality: N/A;  . Electrophysiologic study N/A 03/02/2015    Procedure: Cardioversion;  Surgeon: Wellington Hampshire, MD;  Location: [facility] ORS;  Service: Cardiovascular;  Laterality: N/A;     Family History  Problem Relation Age of Onset  . CAD Sister      Social History   Social History  . Marital Status: Married    Spouse Name: N/A  . Number of Children: N/A  . Years of Education: N/A   Occupational History  .      lives in [place] History Main Topics  . Smoking status: Former Smoker -- 0.30 packs/day for 10 years    Types: Cigarettes    Quit date: 08/09/1983  . Smokeless tobacco: Never Used  . Alcohol Use: No  . Drug  Use: No  . Sexual Activity: Not on file   Other Topics Concern  . Not on file   Social History Narrative   Lives in [place].   Retired Psychologist, counselling     PHYSICAL EXAM   BP 140/82 mmHg  Pulse 77  Ht 5\' 8"  (1.727 m)  Wt 212 lb 8 oz (96.389 kg)  BMI 32.32 kg/m2  Constitutional: She is oriented to person, place, and time. She appears well-developed and well-nourished. No distress.  HENT: No nasal discharge.  Head: Normocephalic and atraumatic.  Eyes: Pupils are equal, round, and reactive to light. Right eye exhibits no discharge. Left eye exhibits no discharge.  Neck: Normal range of motion. Neck supple. No JVD present. No thyromegaly present.  Cardiovascular:Normal rate, irregular rhythm, normal heart sounds. Exam reveals no gallop and  no friction rub. There a 2/6 systolic ejection murmur at the base of the heart. Pulmonary/Chest: Effort normal and breath sounds normal. No stridor. No respiratory distress. She has no wheezes. She has no rales. She exhibits no tenderness.  Abdominal: Soft. Bowel sounds are normal. She exhibits no distension. There is no tenderness. There is no rebound and no guarding.  Musculoskeletal: Normal range of motion. She exhibits no edema and no tenderness.  Neurological: She is alert and oriented to person, place, and time. Coordination normal.  Skin: Skin is warm and dry. No rash noted. She is not diaphoretic. No erythema. No pallor.  Psychiatric: She has a normal mood and affect. Her behavior is normal. Judgment and thought content normal.     EKG: atrial fibrillation with a ventricular rate of 77.  ASSESSMENT AND PLAN

## 2015-05-11 NOTE — Patient Instructions (Signed)
Medication Instructions:  Your physician has recommended you make the following change in your medication:  INCREASE metoprolol to 25mg  three times a day   Labwork: none  Testing/Procedures: none  Follow-Up: Your physician recommends that you schedule a follow-up appointment in: 3 months with Dr. Fletcher Anon.    Any Other Special Instructions Will Be Listed Below (If Applicable).

## 2015-05-11 NOTE — Assessment & Plan Note (Signed)
This was moderate on most recent cardiac cath evaluation. This has not changed over the last 5-6 years.

## 2015-05-11 NOTE — Assessment & Plan Note (Signed)
The patient seems to be doing better with rate control versus using an anti-arrhythmic medication. Recent invasive cardiac evaluation showed that her mitral stenosis was moderate and pulmonary hypertension was also moderate. Given the improvement in her symptoms, it might be reasonable to continue with current therapy for now. She continues to have breakthrough palpitations and I elected to increase the dose of metoprolol to 25 mg 3 times daily. Continue small dose digoxin.

## 2015-06-30 ENCOUNTER — Other Ambulatory Visit: Payer: Self-pay | Admitting: Cardiovascular Disease

## 2015-07-03 DIAGNOSIS — I270 Primary pulmonary hypertension: Secondary | ICD-10-CM | POA: Diagnosis not present

## 2015-07-03 DIAGNOSIS — R739 Hyperglycemia, unspecified: Secondary | ICD-10-CM | POA: Diagnosis not present

## 2015-07-03 DIAGNOSIS — I340 Nonrheumatic mitral (valve) insufficiency: Secondary | ICD-10-CM | POA: Diagnosis not present

## 2015-07-09 DIAGNOSIS — H8110 Benign paroxysmal vertigo, unspecified ear: Secondary | ICD-10-CM | POA: Diagnosis not present

## 2015-07-09 DIAGNOSIS — M79622 Pain in left upper arm: Secondary | ICD-10-CM | POA: Diagnosis not present

## 2015-07-09 DIAGNOSIS — I480 Paroxysmal atrial fibrillation: Secondary | ICD-10-CM | POA: Diagnosis not present

## 2015-07-09 DIAGNOSIS — I270 Primary pulmonary hypertension: Secondary | ICD-10-CM | POA: Diagnosis not present

## 2015-07-09 DIAGNOSIS — I340 Nonrheumatic mitral (valve) insufficiency: Secondary | ICD-10-CM | POA: Diagnosis not present

## 2015-07-13 DIAGNOSIS — Z961 Presence of intraocular lens: Secondary | ICD-10-CM | POA: Diagnosis not present

## 2015-07-13 DIAGNOSIS — H401131 Primary open-angle glaucoma, bilateral, mild stage: Secondary | ICD-10-CM | POA: Diagnosis not present

## 2015-07-15 DIAGNOSIS — M79622 Pain in left upper arm: Secondary | ICD-10-CM | POA: Diagnosis not present

## 2015-07-15 DIAGNOSIS — R05 Cough: Secondary | ICD-10-CM | POA: Diagnosis not present

## 2015-07-15 DIAGNOSIS — J209 Acute bronchitis, unspecified: Secondary | ICD-10-CM | POA: Diagnosis not present

## 2015-07-20 ENCOUNTER — Encounter: Payer: Self-pay | Admitting: Thoracic Surgery (Cardiothoracic Vascular Surgery)

## 2015-07-20 ENCOUNTER — Ambulatory Visit (INDEPENDENT_AMBULATORY_CARE_PROVIDER_SITE_OTHER): Payer: Medicare Other | Admitting: Thoracic Surgery (Cardiothoracic Vascular Surgery)

## 2015-07-20 VITALS — BP 138/70 | HR 80 | Resp 20 | Ht 68.0 in | Wt 212.0 lb

## 2015-07-20 DIAGNOSIS — I481 Persistent atrial fibrillation: Secondary | ICD-10-CM

## 2015-07-20 DIAGNOSIS — I4819 Other persistent atrial fibrillation: Secondary | ICD-10-CM

## 2015-07-20 DIAGNOSIS — I099 Rheumatic heart disease, unspecified: Secondary | ICD-10-CM | POA: Diagnosis not present

## 2015-07-20 DIAGNOSIS — I050 Rheumatic mitral stenosis: Secondary | ICD-10-CM

## 2015-07-20 DIAGNOSIS — I5032 Chronic diastolic (congestive) heart failure: Secondary | ICD-10-CM

## 2015-07-20 DIAGNOSIS — I071 Rheumatic tricuspid insufficiency: Secondary | ICD-10-CM | POA: Diagnosis not present

## 2015-07-20 NOTE — Progress Notes (Signed)
Taylor Weber       [place] [address]             [phone]     CARDIOTHORACIC SURGERY OFFICE NOTE  Referring Provider is Taylor Grayer, MD  Primary Cardiologist is Taylor Hampshire, MD PCP is Taylor Carnes, PA-C   HPI:  Patient returns to the office today for follow-up of moderate mitral stenosis with chronic persistent atrial fibrillation, pulmonary hypertension, tricuspid regurgitation, and chronic diastolic congestive heart failure. She was originally seen in consultation on 04/17/2015. At that time the patient elected to stick with a trial of long-term medical therapy. She states that initially after she stopped taking propafenone she felt better, but over the last 2 months she has developed progressive shortness of breath.  She states that symptoms still seem to wax and wane to some degree, but for the most part she gets short of breath with relatively mild activity. She occasionally has nocturnal shortness of breath and has to sit up when she is sleeping in bed. She sleeps on 4 pillows. She has chronic lower extremity edema that waxes and wanes in severity. She has not had any dizzy spells or syncope. Recently she has had a couple episodes of hemoptysis and some rectal bleeding. She remains chronically anticoagulated using Xarelto.  The remainder of her review of systems is noncontributory and the remainder of her past medical history is unchanged.   Current Outpatient Prescriptions  Medication Sig Dispense Refill  . acetaminophen (TYLENOL) 325 MG tablet Take 650 mg by mouth every 6 (six) hours as needed for pain    . amoxicillin (AMOXIL) 500 MG capsule TK 3 CS PO BID FOR 10 DAYS  0  . AZOPT 1 % ophthalmic suspension Place 1 drop into both eyes Twice daily.    . chlorpheniramine-HYDROcodone (TUSSIONEX) 10-8 MG/5ML SUER TK 5 ML PO  HS FOR 7 DAYS  0  . digoxin (LANOXIN) 0.125 MG tablet Take 1 tablet (0.125 mg total) by mouth daily. 90 tablet 3  . furosemide  (LASIX) 20 MG tablet TAKE 1 TABLET BY MOUTH DAILY 90 tablet 2  . meclizine (ANTIVERT) 25 MG tablet Take 25 mg by mouth 2 (two) times daily as needed for dizziness or nausea.     . metoprolol tartrate (LOPRESSOR) 25 MG tablet Take 1 tablet (25 mg total) by mouth 3 (three) times daily. 270 tablet 3  . nitroGLYCERIN (NITROSTAT) 0.4 MG SL tablet Place 0.4 mg under the tongue every 5 (five) minutes as needed for chest pain.    Marland Kitchen ondansetron (ZOFRAN) 4 MG tablet Take 1 tablet (4 mg total) by mouth every 8 (eight) hours as needed for nausea or vomiting. 20 tablet 0  . XARELTO 20 MG TABS tablet TAKE 1 TABLET BY MOUTH DAILY (Patient taking differently: TAKE 20 MG BY MOUTH DAILY) 90 tablet 3   No current facility-administered medications for this visit.      Physical Exam:   BP 138/70 mmHg  Pulse 80  Resp 20  Ht 5\' 8"  (1.727 m)  Wt 96.163 kg (212 lb)  BMI 32.24 kg/m2  SpO2 98%  General:  Obese but well appearing  Chest:   Clear to auscultation  CV:   Irregular rate and rhythm  Incisions:  n/a  Abdomen:  Soft and nontender  Extremities:  Warm and well perfused, no lower extremity edema  Diagnostic Tests:  n/a   Impression:  Patient has long-standing rheumatic heart disease with moderate mitral  stenosis, chronic persistent atrial fibrillation, pulmonary hypertension, and tricuspid regurgitation. She continues to experience chronic symptoms of exertional shortness of breath, orthopnea, PND, and lower extremity edema consistent with chronic diastolic congestive heart failure, New York Heart Association functional class III. Options include continued long-term medical therapy versus mitral valve replacement, tricuspid valve repair, and Maze procedure.   Plan:  I again reviewed options at length with the patient and her daughter in the office today.  All of their questions have been addressed. The patient is scheduled to see Dr. Fletcher Anon for follow-up next month. She wants to discuss matters  further with him. Depending upon how she does during the interim period of time she will call our office to schedule follow-up if she feels as though she is interested in proceeding with elective surgery at some point in the near future. We have not recommended any changes to her current medications at this time.  I spent in excess of 30 minutes during the conduct of this office consultation and >50% of this time involved direct face-to-face encounter with the patient for counseling and/or coordination of their care.   Taylor Gu. Roxy Manns, MD 07/20/2015 12:44 PM

## 2015-07-20 NOTE — Patient Instructions (Signed)
Continue all previous medications without any changes at this time  

## 2015-07-21 DIAGNOSIS — M79622 Pain in left upper arm: Secondary | ICD-10-CM | POA: Diagnosis not present

## 2015-08-04 ENCOUNTER — Encounter: Payer: Self-pay | Admitting: Cardiovascular Disease

## 2015-08-04 ENCOUNTER — Telehealth: Payer: Self-pay

## 2015-08-04 ENCOUNTER — Ambulatory Visit (INDEPENDENT_AMBULATORY_CARE_PROVIDER_SITE_OTHER): Payer: Medicare Other | Admitting: Cardiovascular Disease

## 2015-08-04 ENCOUNTER — Other Ambulatory Visit
Admission: RE | Admit: 2015-08-04 | Discharge: 2015-08-04 | Disposition: A | Discharge status: Home / Self Care | Payer: Medicare Other | Source: Ambulatory Visit | Attending: Cardiovascular Disease | Admitting: Cardiovascular Disease

## 2015-08-04 VITALS — BP 136/77 | HR 86 | Ht 68.0 in | Wt 210.0 lb

## 2015-08-04 DIAGNOSIS — K625 Hemorrhage of anus and rectum: Secondary | ICD-10-CM | POA: Diagnosis not present

## 2015-08-04 DIAGNOSIS — I481 Persistent atrial fibrillation: Secondary | ICD-10-CM | POA: Diagnosis not present

## 2015-08-04 DIAGNOSIS — I4891 Unspecified atrial fibrillation: Secondary | ICD-10-CM | POA: Insufficient documentation

## 2015-08-04 DIAGNOSIS — I050 Rheumatic mitral stenosis: Secondary | ICD-10-CM | POA: Diagnosis not present

## 2015-08-04 DIAGNOSIS — I4819 Other persistent atrial fibrillation: Secondary | ICD-10-CM

## 2015-08-04 DIAGNOSIS — I5032 Chronic diastolic (congestive) heart failure: Secondary | ICD-10-CM

## 2015-08-04 LAB — CBC
HCT: 40.1 % (ref 35.0–47.0)
HEMOGLOBIN: 13.6 g/dL (ref 12.0–16.0)
MCH: 29.3 pg (ref 26.0–34.0)
MCHC: 33.8 g/dL (ref 32.0–36.0)
MCV: 86.7 fL (ref 80.0–100.0)
Platelets: 181 10*3/uL (ref 150–440)
RBC: 4.63 MIL/uL (ref 3.80–5.20)
RDW: 14 % (ref 11.5–14.5)
WBC: 9.9 10*3/uL (ref 3.6–11.0)

## 2015-08-04 NOTE — Progress Notes (Signed)
HPI  This is a 69 year old female who is here today for followup visit. She has persistent atrial fibrillation . She is on anticoagulation with Xarelto without any reported side effects. In the past when she was on warfarin she had significant difficulty in maintaining therapeutic INR. 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 No coronary artery disease on previous cardiac catheterization. She has been on multiple antiarrhythmics medications most recently Rythmol. She has required multiple cardioversions most recently in October of last year.  She had recurrent atrial fibrillation after recent cardioversion. Thus, I referred her to see Dr. Rayann Heman. The recommendation was to consider mitral valve surgery plus surgical maze. I proceeded with a right and left cardiac catheterization in 04/2015 which showed normal coronary arteries, low normal LV systolic function, moderate mitral valve stenosis and moderate pulmonary hypertension. She saw Dr. Roxy Manns to discuss the surgery.  The patient had improvement in symptoms after switching from propafenone to rate control. She now reports may be some worsening of exertional dyspnea but she appears to be extremely stressed and anxious. She seems to be stressed out about the decision whether to proceed with surgery or not. She noted recent episodes of rectal bleeding which is intermittent and not very frequent. She was told in the past about polyps but no recent GI follow-up.   Allergies  Allergen Reactions  . Amoxicillin-Pot Clavulanate Shortness Of Breath    AUGMENTIN  Other Reaction: GI Upset  . Lidocaine Shortness Of Breath and Palpitations  . Amiodarone Other (See Comments)    Alopecia  . Clodronic Acid Rash  . Darvon Rash  . Epinephrine Palpitations and Other (See Comments)    Pt is unsure of this reaction  . Levofloxacin Rash  . Nitrofuran Derivatives Rash and Other (See Comments)    (generic  for Macrobid) Rash  . Pentazocine Lactate Rash  . Procaine Hcl Rash  . Propoxyphene Palpitations  . Septra [Sulfamethoxazole W/Trimethoprim (Co-Trimoxazole)] Rash     Current Outpatient Prescriptions on File Prior to Visit  Medication Sig Dispense Refill  . acetaminophen (TYLENOL) 325 MG tablet Take 650 mg by mouth every 6 (six) hours as needed for pain    . AZOPT 1 % ophthalmic suspension Place 1 drop into both eyes Twice daily.    . digoxin (LANOXIN) 0.125 MG tablet Take 1 tablet (0.125 mg total) by mouth daily. 90 tablet 3  . furosemide (LASIX) 20 MG tablet TAKE 1 TABLET BY MOUTH DAILY 90 tablet 2  . meclizine (ANTIVERT) 25 MG tablet Take 25 mg by mouth 2 (two) times daily as needed for dizziness or nausea.     . metoprolol tartrate (LOPRESSOR) 25 MG tablet Take 1 tablet (25 mg total) by mouth 3 (three) times daily. 270 tablet 3  . nitroGLYCERIN (NITROSTAT) 0.4 MG SL tablet Place 0.4 mg under the tongue every 5 (five) minutes as needed for chest pain.    Marland Kitchen ondansetron (ZOFRAN) 4 MG tablet Take 1 tablet (4 mg total) by mouth every 8 (eight) hours as needed for nausea or vomiting. 20 tablet 0  . XARELTO 20 MG TABS tablet TAKE 1 TABLET BY MOUTH DAILY (Patient taking differently: TAKE 20 MG BY MOUTH DAILY) 90 tablet 3   No current facility-administered medications on file prior to visit.     Past Medical History  Diagnosis Date  . Chest pain     normal coronary angiogram in december 2008  . Mild aortic regurgitation with  left ventricular dilation by prior echocardiogram   . Atherosclerotic cerebrovascular disease     nonobstructive  . Silicone leakage from breast implant   . Dyspnea     chronic exertional dyspnea related to intermittant atrial  . Carotid bruit     right   . Pneumonia   . Rheumatic fever     as a child  . Breast cancer ([place])     remote right breast mastectomy,bilaterial implants  . Breast cancer ([place])     radical mastectomy status post billaterial breast  implants and right breast implat rupture  . Diabetes mellitus     diet controlled  . Mitral stenosis     moderate  . 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 Persistent atrial fibrillation ([place])     post cardioversion maintain normal sinus rhythm  . Vertigo   . Carotid arterial disease ([place])   . Tricuspid regurgitation   . Rheumatic mitral stenosis   . Chronic diastolic congestive heart failure [facility])      Past Surgical History  Procedure Laterality Date  . Appendectomy    . Cholecystectomy    . Total abdominal hysterectomy    . Cardiac catheterization  2008    no significant CAD  . Cardiac catheterization  04/2011    No significant CAD, moderate mitral stenosis (mean gradient: 12 mm Hg, MVA: 1.83), moderate pulmonary hypertension (PVR: 2.4 Woods units), Normal LVEDP.   . Cataract extraction Right   . Mastectomy Right   . Placement of breast implants      s/p mastectomy  . Cardiac catheterization N/A 04/15/2015    Procedure: Right/Left Heart Cath and Coronary Angiography;  Surgeon: Wellington Hampshire, MD;  Location: [facility];  Service: Cardiovascular;  Laterality: N/A;  . Tee without cardioversion N/A 04/15/2015    Procedure: TRANSESOPHAGEAL ECHOCARDIOGRAM (TEE);  Surgeon: Dorothy Spark, MD;  Location: [facility] ENDOSCOPY;  Service: Cardiovascular;  Laterality: N/A;  . Electrophysiologic study N/A 03/02/2015    Procedure: Cardioversion;  Surgeon: Wellington Hampshire, MD;  Location: [facility] ORS;  Service: Cardiovascular;  Laterality: N/A;     Family History  Problem Relation Age of Onset  . CAD Sister      Social History   Social History  . Marital Status: Married    Spouse Name: N/A  . Number of Children: N/A  . Years of Education: N/A   Occupational History  .      lives in [place] History Main Topics  . Smoking status: Former Smoker -- 0.30 packs/day for 10 years    Types: Cigarettes    Quit date: 08/09/1983    . Smokeless tobacco: Never Used  . Alcohol Use: No  . Drug Use: No  . Sexual Activity: Not on file   Other Topics Concern  . Not on file   Social History Narrative   Lives in [place].   Retired Psychologist, counselling     PHYSICAL EXAM   BP 136/77 mmHg  Pulse 86  Ht 5\' 8"  (1.727 m)  Wt 210 lb (95.255 kg)  BMI 31.94 kg/m2  Constitutional: She is oriented to person, place, and time. She appears well-developed and well-nourished. No distress.  HENT: No nasal discharge.  Head: Normocephalic and atraumatic.  Eyes: Pupils are equal, round, and reactive to light. Right eye exhibits no discharge. Left eye exhibits no discharge.  Neck: Normal range of motion. Neck supple. No  JVD present. No thyromegaly present.  Cardiovascular:Normal rate, irregular rhythm, normal heart sounds. Exam reveals no gallop and no friction rub. There a 2/6 systolic ejection murmur at the base of the heart. Pulmonary/Chest: Effort normal and breath sounds normal. No stridor. No respiratory distress. She has no wheezes. She has no rales. She exhibits no tenderness.  Abdominal: Soft. Bowel sounds are normal. She exhibits no distension. There is no tenderness. There is no rebound and no guarding.  Musculoskeletal: Normal range of motion. She exhibits no edema and no tenderness.  Neurological: She is alert and oriented to person, place, and time. Coordination normal.  Skin: Skin is warm and dry. No rash noted. She is not diaphoretic. No erythema. No pallor.  Psychiatric: She has a normal mood and affect. Her behavior is normal. Judgment and thought content normal.     EKG: atrial fibrillation with a ventricular rate of 86.  ASSESSMENT AND PLAN

## 2015-08-04 NOTE — Patient Instructions (Addendum)
Medication Instructions:  Your physician recommends that you continue on your current medications as directed. Please refer to the Current Medication list given to you today.   Labwork: CBC  Testing/Procedures: none  Follow-Up: Your physician recommends that you schedule a follow-up appointment in: four months with Dr. Fletcher Anon.    Any Other Special Instructions Will Be Listed Below (If Applicable). Referral to GI . [facility] [phone] Per Manuela Schwartz, receptionist, they will call pt to set up appointment     If you need a refill on your cardiac medications before your next appointment, please call your pharmacy.

## 2015-08-04 NOTE — Assessment & Plan Note (Signed)
She appears to be euvolemic on current dose of Lasix.

## 2015-08-04 NOTE — Assessment & Plan Note (Signed)
Continue with rate control and anticoagulation. 

## 2015-08-04 NOTE — Assessment & Plan Note (Signed)
The patient had moderate mitral valve stenosis which has been stable over the last 5 years. It's difficult to know exactly how much improvement she would have with surgery for mitral valve disease and atrial fibrillation. I think she appears to be stable right now with rate control and she wants to continue with medical therapy. She does seem to be very anxious and stressed which might be contributing to some of her symptoms.

## 2015-08-04 NOTE — Assessment & Plan Note (Signed)
She is on anticoagulation for atrial fibrillation. I requested CBC and referred her to gastroenterology given her reported history of polyps. She reports no hemorrhoids.

## 2015-08-04 NOTE — Telephone Encounter (Signed)
Pt called back to review labs. Reviewed results w/pt who verbalized understanding and is agreeable w/plan (see Result Note)

## 2015-08-10 ENCOUNTER — Other Ambulatory Visit: Payer: Self-pay | Admitting: Cardiovascular Disease

## 2015-08-12 ENCOUNTER — Encounter: Payer: Self-pay | Admitting: Internal Medicine

## 2015-08-25 ENCOUNTER — Ambulatory Visit: Payer: Medicare Other | Admitting: Nurse Practitioner

## 2015-09-24 DIAGNOSIS — H40013 Open angle with borderline findings, low risk, bilateral: Secondary | ICD-10-CM | POA: Diagnosis not present

## 2015-10-19 DIAGNOSIS — R110 Nausea: Secondary | ICD-10-CM | POA: Diagnosis not present

## 2015-10-19 DIAGNOSIS — E785 Hyperlipidemia, unspecified: Secondary | ICD-10-CM | POA: Diagnosis not present

## 2015-10-19 DIAGNOSIS — I480 Paroxysmal atrial fibrillation: Secondary | ICD-10-CM | POA: Diagnosis not present

## 2015-10-19 DIAGNOSIS — I270 Primary pulmonary hypertension: Secondary | ICD-10-CM | POA: Diagnosis not present

## 2015-10-19 DIAGNOSIS — F419 Anxiety disorder, unspecified: Secondary | ICD-10-CM | POA: Diagnosis not present

## 2015-10-19 DIAGNOSIS — I340 Nonrheumatic mitral (valve) insufficiency: Secondary | ICD-10-CM | POA: Diagnosis not present

## 2015-11-26 DIAGNOSIS — I340 Nonrheumatic mitral (valve) insufficiency: Secondary | ICD-10-CM | POA: Diagnosis not present

## 2015-11-26 DIAGNOSIS — R609 Edema, unspecified: Secondary | ICD-10-CM | POA: Diagnosis not present

## 2015-11-26 DIAGNOSIS — I480 Paroxysmal atrial fibrillation: Secondary | ICD-10-CM | POA: Diagnosis not present

## 2015-11-26 DIAGNOSIS — M7522 Bicipital tendinitis, left shoulder: Secondary | ICD-10-CM | POA: Diagnosis not present

## 2015-11-26 DIAGNOSIS — I270 Primary pulmonary hypertension: Secondary | ICD-10-CM | POA: Diagnosis not present

## 2015-12-03 ENCOUNTER — Encounter: Payer: Self-pay | Admitting: Cardiovascular Disease

## 2015-12-03 ENCOUNTER — Ambulatory Visit (INDEPENDENT_AMBULATORY_CARE_PROVIDER_SITE_OTHER): Payer: Medicare Other | Admitting: Cardiovascular Disease

## 2015-12-03 VITALS — BP 100/70 | HR 71 | Ht 68.5 in | Wt 214.5 lb

## 2015-12-03 DIAGNOSIS — I5032 Chronic diastolic (congestive) heart failure: Secondary | ICD-10-CM | POA: Diagnosis not present

## 2015-12-03 DIAGNOSIS — I4819 Other persistent atrial fibrillation: Secondary | ICD-10-CM

## 2015-12-03 DIAGNOSIS — I481 Persistent atrial fibrillation: Secondary | ICD-10-CM | POA: Diagnosis not present

## 2015-12-03 MED ORDER — FUROSEMIDE 40 MG PO TABS: 40.0000 mg | ORAL_TABLET | Freq: Every day | ORAL | Status: DC

## 2015-12-03 MED ORDER — DIGOXIN 125 MCG PO TABS: 0.1250 mg | ORAL_TABLET | Freq: Every day | ORAL | Status: DC

## 2015-12-03 MED ORDER — POTASSIUM CHLORIDE CRYS ER 20 MEQ PO TBCR: 20.0000 meq | EXTENDED_RELEASE_TABLET | Freq: Every day | ORAL | Status: DC

## 2015-12-03 NOTE — Progress Notes (Signed)
Cardiology Office Note   Date:  12/03/2015   ID:  Taylor ORDONES, DOB 11-23-1945, MRN [id number]  PCP:  Jeri Modena  Cardiologist:   Kathlyn Sacramento, MD   Chief Complaint  Patient presents with  . OTHER    4 month f/u c/o rapid heart rate, sob , edema and legs heaviness . Pt d/c digoxin for some time now mentioned that the drug "scares her". Meds reviewd verbally.      History of Present Illness: Taylor Weber is a 70 y.o. female who presents for a followup visit. She has persistent atrial fibrillation . She is on anticoagulation with Xarelto without any reported side effects. In the past when she was on warfarin she had significant difficulty in maintaining therapeutic INR. 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 No coronary artery disease on previous cardiac catheterization. She has been on multiple antiarrhythmics medications most recently Rythmol. She has required multiple cardioversions most recently in July 2016.  She had recurrent atrial fibrillation after cardioversion. Thus, I referred her to see Dr. Rayann Heman. The recommendation was to consider mitral valve surgery plus surgical maze. Right and left cardiac catheterization in 04/2015 showed normal coronary arteries, low normal LV systolic function, moderate mitral valve stenosis and moderate pulmonary hypertension. She saw Dr. Roxy Manns to discuss the surgery.  The patient had improvement in symptoms after switching from propafenone to rate control and thus she was treated medically.  She reports worsening exertional dyspnea and palpitation over the last few weeks with increased leg edema. She has not been taking digoxin as prescribed due to fear of side effects. He continues to be anxious. She has no chest pain.   Past Medical History  Diagnosis Date  . Chest pain     normal coronary angiogram in december 2008  . Mild aortic regurgitation with left ventricular  dilation by prior echocardiogram   . Atherosclerotic cerebrovascular disease     nonobstructive  . Silicone leakage from breast implant   . Dyspnea     chronic exertional dyspnea related to intermittant atrial  . Carotid bruit     right   . Pneumonia   . Rheumatic fever     as a child  . Breast cancer ([place])     remote right breast mastectomy,bilaterial implants  . Breast cancer ([place])     radical mastectomy status post billaterial breast implants and right breast implat rupture  . Diabetes mellitus     diet controlled  . Mitral stenosis     moderate  . 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 Persistent atrial fibrillation ([place])     post cardioversion maintain normal sinus rhythm  . Vertigo   . Carotid arterial disease ([place])   . Tricuspid regurgitation   . Rheumatic mitral stenosis   . Chronic diastolic congestive heart failure [facility])     Past Surgical History  Procedure Laterality Date  . Appendectomy    . Cholecystectomy    . Total abdominal hysterectomy    . Cardiac catheterization  2008    no significant CAD  . Cardiac catheterization  04/2011    No significant CAD, moderate mitral stenosis (mean gradient: 12 mm Hg, MVA: 1.83), moderate pulmonary hypertension (PVR: 2.4 Woods units), Normal LVEDP.   . Cataract extraction Right   . Mastectomy Right   . Placement of breast implants      s/p mastectomy  .  Cardiac catheterization N/A 04/15/2015    Procedure: Right/Left Heart Cath and Coronary Angiography;  Surgeon: Wellington Hampshire, MD;  Location: [facility];  Service: Cardiovascular;  Laterality: N/A;  . Tee without cardioversion N/A 04/15/2015    Procedure: TRANSESOPHAGEAL ECHOCARDIOGRAM (TEE);  Surgeon: Dorothy Spark, MD;  Location: [facility];  Service: Cardiovascular;  Laterality: N/A;  . Electrophysiologic study N/A 03/02/2015    Procedure: Cardioversion;  Surgeon: Wellington Hampshire, MD;  Location: [facility] ORS;  Service:  Cardiovascular;  Laterality: N/A;     Current Outpatient Prescriptions  Medication Sig Dispense Refill  . acetaminophen (TYLENOL) 325 MG tablet Take 650 mg by mouth every 6 (six) hours as needed for pain    . furosemide (LASIX) 20 MG tablet TAKE 1 TABLET BY MOUTH DAILY 90 tablet 2  . meclizine (ANTIVERT) 25 MG tablet Take 25 mg by mouth 2 (two) times daily as needed for dizziness or nausea.     . metoprolol tartrate (LOPRESSOR) 25 MG tablet Take 1 tablet (25 mg total) by mouth 3 (three) times daily. 270 tablet 3  . nitroGLYCERIN (NITROSTAT) 0.4 MG SL tablet Place 0.4 mg under the tongue every 5 (five) minutes as needed for chest pain.    Marland Kitchen ondansetron (ZOFRAN) 4 MG tablet Take 1 tablet (4 mg total) by mouth every 8 (eight) hours as needed for nausea or vomiting. 20 tablet 0  . XARELTO 20 MG TABS tablet TAKE 1 TABLET BY MOUTH DAILY (Patient taking differently: TAKE 20 MG BY MOUTH DAILY) 90 tablet 3   No current facility-administered medications for this visit.    Allergies:   Amoxicillin-pot clavulanate; Lidocaine; Amiodarone; Clodronic acid; Darvon; Epinephrine; Levofloxacin; Nitrofuran derivatives; Pentazocine lactate; Procaine hcl; Propoxyphene; and Septra    Social History:  The patient  reports that she quit smoking about 32 years ago. Her smoking use included Cigarettes. She has a 3 pack-year smoking history. She has never used smokeless tobacco. She reports that she does not drink alcohol or use illicit drugs.   Family History:  The patient's family history includes CAD in her sister.    ROS:  Please see the history of present illness.   Otherwise, review of systems are positive for none.   All other systems are reviewed and negative.    PHYSICAL EXAM: VS:  BP 100/70 mmHg  Pulse 71  Ht 5' 8.5" (1.74 m)  Wt 214 lb 8 oz (97.297 kg)  BMI 32.14 kg/m2 , BMI Body mass index is 32.14 kg/(m^2). GEN: Well nourished, well developed, in no acute distress HEENT: normal Neck: no  carotid  bruits, or masses. Significant JVD Cardiac: Irregularly irregular; no rubs, or gallops. There is 2/6 holosystolic murmur at the left sternal border. She has +1 edema bilaterally Respiratory:  clear to auscultation bilaterally, normal work of breathing GI: soft, nontender, nondistended, + BS MS: no deformity or atrophy Skin: warm and dry, no rash Neuro:  Strength and sensation are intact Psych: euthymic mood, full affect   EKG:  EKG is ordered today. The ekg ordered today demonstrates atrial fibrillation with a heart rate of 71 bpm.   Recent Labs: 12/08/2014: TSH 1.830 04/15/2015: BUN 21*; Creatinine, Ser 0.79; Potassium 5.0; Sodium 138 08/04/2015: Hemoglobin 13.6; Platelets 181    Lipid Panel No results found for: CHOL, TRIG, HDL, CHOLHDL, VLDL, LDLCALC, LDLDIRECT    Wt Readings from Last 3 Encounters:  12/03/15 214 lb 8 oz (97.297 kg)  08/04/15 210 lb (95.255 kg)  07/20/15 212 lb (  96.163 kg)       ASSESSMENT AND PLAN:  1.  Chronic atrial fibrillation: Currently being treated with rate control. She is complaining of increased exertional palpitations. Not able to increase the dose of metoprolol due to relatively low blood pressure. Thus, I resumed digoxin 0.125 mg once daily. Check digoxin level in one week.  2. Acute on chronic diastolic heart failure: The patient gained 4 pounds since her last visit. She has increased leg edema and JVD. Thus, I increased the dose of furosemide to 40 mg once daily and added K-Dur 20 mEq once daily. Check basic metabolic profile in one week.  3. Moderate rheumatic mitral stenosis: This was determined to be moderate in severity based on TEE and heart catheterization last year. This has been stable like this for many years.  4. Moderate to severe pulmonary hypertension: I increased the dose of furosemide as outlined above.    Disposition:   FU with me in 3 months  Signed,  Kathlyn Sacramento, MD  12/03/2015 1:43 PM    [place]

## 2015-12-03 NOTE — Patient Instructions (Signed)
Medication Instructions:  Your physician has recommended you make the following change in your medication:  START taking digoxin 0.125mg  once daily INCREASE lasix to 40mg  once daily START potassium 71mEq once daily    Labwork: BMET and digoxin level  Testing/Procedures: none  Follow-Up: Your physician recommends that you schedule a follow-up appointment in: 3 months with Dr. Fletcher Anon.    Any Other Special Instructions Will Be Listed Below (If Applicable).     If you need a refill on your cardiac medications before your next appointment, please call your pharmacy.

## 2015-12-10 DIAGNOSIS — M7522 Bicipital tendinitis, left shoulder: Secondary | ICD-10-CM | POA: Diagnosis not present

## 2015-12-10 DIAGNOSIS — I890 Lymphedema, not elsewhere classified: Secondary | ICD-10-CM | POA: Diagnosis not present

## 2015-12-22 DIAGNOSIS — H43811 Vitreous degeneration, right eye: Secondary | ICD-10-CM | POA: Diagnosis not present

## 2015-12-30 ENCOUNTER — Telehealth: Payer: Self-pay | Admitting: Cardiovascular Disease

## 2015-12-30 DIAGNOSIS — M7582 Other shoulder lesions, left shoulder: Secondary | ICD-10-CM | POA: Diagnosis not present

## 2015-12-30 DIAGNOSIS — M25512 Pain in left shoulder: Secondary | ICD-10-CM | POA: Diagnosis not present

## 2015-12-30 DIAGNOSIS — Z853 Personal history of malignant neoplasm of breast: Secondary | ICD-10-CM | POA: Diagnosis not present

## 2015-12-30 NOTE — Telephone Encounter (Signed)
At 4/27 OV, BMET and digoxin level ordered. She indicated she would have these drawn at MD office closer to her home in [name]. Left message on pt home VM regarding need for results. Awaiting call back.

## 2016-01-05 NOTE — Telephone Encounter (Signed)
Attempted to contact pt on home phone. Female who answered stated she was not there. Left message on cell VM regarding need for dig and BMET lab results from beginning of May.

## 2016-01-08 ENCOUNTER — Telehealth: Payer: Self-pay | Admitting: Cardiovascular Disease

## 2016-01-08 NOTE — Telephone Encounter (Signed)
Pt states she has lost her lab orders, and needs them mailed to her. Please call.

## 2016-01-08 NOTE — Telephone Encounter (Signed)
Mailed another copy  BMET and digoxin labs. Notified pt who states "I am just not a good patient" and states she will have labs drawn next week either at [facility] or the lab across the street. She verbalized understanding with no further questions.

## 2016-01-08 NOTE — Telephone Encounter (Signed)
Left message on machine for patient to contact the office.  Letter sent regarding need for lab results

## 2016-01-14 DIAGNOSIS — Z7901 Long term (current) use of anticoagulants: Secondary | ICD-10-CM | POA: Diagnosis not present

## 2016-01-14 DIAGNOSIS — Z853 Personal history of malignant neoplasm of breast: Secondary | ICD-10-CM | POA: Diagnosis not present

## 2016-01-14 DIAGNOSIS — I509 Heart failure, unspecified: Secondary | ICD-10-CM | POA: Diagnosis not present

## 2016-01-14 DIAGNOSIS — I4891 Unspecified atrial fibrillation: Secondary | ICD-10-CM | POA: Diagnosis not present

## 2016-01-14 DIAGNOSIS — Z882 Allergy status to sulfonamides status: Secondary | ICD-10-CM | POA: Diagnosis not present

## 2016-01-14 DIAGNOSIS — M659 Synovitis and tenosynovitis, unspecified: Secondary | ICD-10-CM | POA: Diagnosis not present

## 2016-01-14 DIAGNOSIS — Z881 Allergy status to other antibiotic agents status: Secondary | ICD-10-CM | POA: Diagnosis not present

## 2016-01-14 DIAGNOSIS — M65869 Other synovitis and tenosynovitis, unspecified lower leg: Secondary | ICD-10-CM | POA: Diagnosis not present

## 2016-01-14 DIAGNOSIS — Z888 Allergy status to other drugs, medicaments and biological substances status: Secondary | ICD-10-CM | POA: Diagnosis not present

## 2016-01-14 DIAGNOSIS — Z79899 Other long term (current) drug therapy: Secondary | ICD-10-CM | POA: Diagnosis not present

## 2016-01-15 ENCOUNTER — Other Ambulatory Visit: Payer: Self-pay

## 2016-01-20 DIAGNOSIS — I480 Paroxysmal atrial fibrillation: Secondary | ICD-10-CM | POA: Diagnosis not present

## 2016-01-20 DIAGNOSIS — E785 Hyperlipidemia, unspecified: Secondary | ICD-10-CM | POA: Diagnosis not present

## 2016-01-20 DIAGNOSIS — F419 Anxiety disorder, unspecified: Secondary | ICD-10-CM | POA: Diagnosis not present

## 2016-01-20 DIAGNOSIS — I270 Primary pulmonary hypertension: Secondary | ICD-10-CM | POA: Diagnosis not present

## 2016-01-20 DIAGNOSIS — R946 Abnormal results of thyroid function studies: Secondary | ICD-10-CM | POA: Diagnosis not present

## 2016-01-20 DIAGNOSIS — R599 Enlarged lymph nodes, unspecified: Secondary | ICD-10-CM | POA: Diagnosis not present

## 2016-01-20 DIAGNOSIS — I340 Nonrheumatic mitral (valve) insufficiency: Secondary | ICD-10-CM | POA: Diagnosis not present

## 2016-01-20 DIAGNOSIS — R739 Hyperglycemia, unspecified: Secondary | ICD-10-CM | POA: Diagnosis not present

## 2016-01-22 DIAGNOSIS — H401131 Primary open-angle glaucoma, bilateral, mild stage: Secondary | ICD-10-CM | POA: Diagnosis not present

## 2016-01-27 ENCOUNTER — Telehealth: Payer: Self-pay | Admitting: Cardiovascular Disease

## 2016-01-27 NOTE — Telephone Encounter (Signed)
Pt calling stating she did her labs about 2 weeks ago Has not heard anything from our office about this Please advise.

## 2016-01-27 NOTE — Telephone Encounter (Signed)
Per pt request, I mailed labs orders for BMET and digoxin on June 2.  She had labs drawn June 14 at location near her home in [place], [place] and scanned in pt chart June 15. They were not routed to Dr. Fletcher Anon. Pt called today for results.  Labs drawn were CMET and CBC. I do not see digoxin results.  S/w pt who reports she had labs drawn at PCP ([place]). She gave BMET and digoxin labs orders to them. She will call their office today to inquire if they drew digoxin lab and will call back.

## 2016-01-29 NOTE — Telephone Encounter (Signed)
S/w [place] who confirmed pt had digoxin level drawn. Results faxed here and scanned in to patient chart. Routed to MD for review.

## 2016-02-02 ENCOUNTER — Other Ambulatory Visit: Payer: Self-pay

## 2016-02-02 ENCOUNTER — Telehealth: Payer: Self-pay | Admitting: Cardiovascular Disease

## 2016-02-02 NOTE — Telephone Encounter (Signed)
Labs were fine. Digoxin level was good. Continue same medications. Is she feeling better?       Reviewed results and recommendations w/pt who verbalized understanding.  Pt reports feeling well, HR better controlled w/digoxin. She has decreased lasix back to 20mg  qd after being increased to 40mg  qd at 4/27 OV d/t weight gain, leg edema and JVD. K+ 23mEq was added. Reports increased weakness and "I couldn't stay out of the bathroom when I was taking increased dose".  When labs were drawn 6/14, she was taking lasix 20mg  and "potassium some of the time" . Labs are scanned in pt chart.  Since decreasing lasix, she reports stable weight though she doesn't weigh daily. Breathing is normal and she feels well. No LE swelling.  Advised pt to weigh daily and report weight gains 2+lb/day or 5lb/week. She inquires whether she should continue potassium. Since K+ was added w/increased lasix dosage which she is not taking, advised pt to only take it if she increases lasix to 40mg . Pt verbalized understanding. Forward to MD to review and further advise.

## 2016-02-04 NOTE — Telephone Encounter (Signed)
I agree. Thanks.

## 2016-02-05 NOTE — Telephone Encounter (Signed)
S/w pt to confirm Dr. Fletcher Anon is agreeable w/meds as she is taking them at this time.

## 2016-02-15 ENCOUNTER — Telehealth: Payer: Self-pay | Admitting: Cardiovascular Disease

## 2016-02-15 NOTE — Telephone Encounter (Signed)
S/w pt who reports jaw and back pain 6/10 around 8am. Describes as discomfort running down into her neck. Pain resolved after an hour. She was a little nauseated but this is not unusual for her. HR dropped in 40s-50s. She called her daughter who  wanted to take her to ED. Pt refused. She went to daughter's house instead. Called PCP, MD not there. Recommended she go to ED. She did not go. HR back up in 70-80's but has fluctuated 40s-50s.  She took afternoon metoprolol at 4pm as HR was 79. Reports BP 113/62, HR 79. Denies any sx currently and states "I feel much better now". Pt has hx of afib but she does not feel like it has anything to do with this.  Her daughter is a Marine scientist and told her she could have had a mild heart attack.  Advised pt to be seen today in an ER setting. States "I really don't know. I can't say. If he (Dr. Fletcher Anon) tells me to, I will"  Advised pt Dr. Fletcher Anon is not in the office now and she should be evaluated today. She verbalized understanding but is still unsure she will go to ER.  Will forward to MD to make aware.

## 2016-02-15 NOTE — Telephone Encounter (Signed)
I doubt MI with 2 previous normal caths. If she is feeling fine now, no need to go to the emergency room. Continue to monitor symptoms.

## 2016-02-15 NOTE — Telephone Encounter (Signed)
Pt calling stating she had a spell this morning with jaw pain and back pain. After her spell, HR dropped to 40-50's  Usually takes Metoprolol around 2pm  Just a bit ago started like something was running across her chest HR back up in 70's  Not sure if she should take the medication but is concerned about this.  Just needs to know.  Denies SOB/CP  States she thinks it may be her medication but not sure. Would like to speak to the nurse.

## 2016-02-15 NOTE — Telephone Encounter (Signed)
Left message on machine for patient to contact the office.   

## 2016-02-16 NOTE — Telephone Encounter (Signed)
S/w pt who reports "feeling fine today". BP was low last evening therefore, she skipped evening dose metoprolol. BP improved this AM and she has taken morning meds. Reviewed Dr. Tyrell Antonio recommendations w/pt who verbalized understanding. She had no further questions at this time.

## 2016-03-04 ENCOUNTER — Encounter: Payer: Self-pay | Admitting: Cardiovascular Disease

## 2016-03-04 ENCOUNTER — Ambulatory Visit (INDEPENDENT_AMBULATORY_CARE_PROVIDER_SITE_OTHER): Payer: Medicare Other | Admitting: Cardiovascular Disease

## 2016-03-04 DIAGNOSIS — I481 Persistent atrial fibrillation: Secondary | ICD-10-CM

## 2016-03-04 DIAGNOSIS — I4819 Other persistent atrial fibrillation: Secondary | ICD-10-CM

## 2016-03-04 DIAGNOSIS — I5032 Chronic diastolic (congestive) heart failure: Secondary | ICD-10-CM

## 2016-03-04 NOTE — Patient Instructions (Addendum)
Medication Instructions:  Your physician recommends that you continue on your current medications as directed. Please refer to the Current Medication list given to you today.   Labwork: none  Testing/Procedures: none  Follow-Up: Your physician wants you to follow-up in: six months with Dr. Arida.  You will receive a reminder letter in the mail two months in advance. If you don't receive a letter, please call our office to schedule the follow-up appointment.   Any Other Special Instructions Will Be Listed Below (If Applicable).     If you need a refill on your cardiac medications before your next appointment, please call your pharmacy.   

## 2016-03-04 NOTE — Progress Notes (Signed)
Cardiology Office Note   Date:  03/04/2016   ID:  Taylor Weber, DOB Oct 01, 1945, MRN [id number]  PCP:  Taylor Weber  Cardiologist:   Taylor Sacramento, MD   Chief Complaint  Patient presents with  . Other    3 month follow up. Meds reviewed by the patient verbally. Pt. c/o a spell of chest & neck pain on February 15, 2016.       History of Present Illness: Taylor Weber is a 70 y.o. 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 She is on anticoagulation with Xarelto without any reported side effects. In the past when she was on warfarin she had significant difficulty in maintaining therapeutic INR.   No coronary artery disease on previous cardiac catheterization. She has been on multiple antiarrhythmics medications most recently Rythmol. She has required multiple cardioversions most recently in July 2016.  She had recurrent atrial fibrillation in spite of multiple antiarrhythmic medications and cardioversion unless she is being treated with rate control. During last visit, I added digoxin for rate control. She has been doing much better since then. She had labs done in June which showed normal kidney function and electrolytes. Digoxin level was 0.5. She had one episode of jaundice back pain a few weeks ago which resolved without intervention. No recurrent symptoms.    Past Medical History:  Diagnosis Date  . Atherosclerotic cerebrovascular disease    nonobstructive  . Breast cancer ([place])    remote right breast mastectomy,bilaterial implants  . Breast cancer ([place])    radical mastectomy status post billaterial breast implants and right breast implat rupture  . Carotid arterial disease ([place])   . Carotid bruit    right   . Chest pain    normal coronary angiogram in december 2008  . Chronic diastolic congestive heart failure ([place])   . Diabetes  mellitus    diet controlled  . Dyspnea    chronic exertional dyspnea related to intermittant atrial  . Mild aortic regurgitation with left ventricular dilation by prior echocardiogram   . Mitral stenosis    moderate  . Persistent atrial fibrillation ([place])    post cardioversion maintain normal sinus rhythm  . Pneumonia   . 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 Rheumatic fever    as a child  . Rheumatic mitral stenosis   . Silicone leakage from breast implant   . Tricuspid regurgitation   . Vertigo     Past Surgical History:  Procedure Laterality Date  . APPENDECTOMY    . CARDIAC CATHETERIZATION  2008   no significant CAD  . CARDIAC CATHETERIZATION  04/2011   No significant CAD, moderate mitral stenosis (mean gradient: 12 mm Hg, MVA: 1.83), moderate pulmonary hypertension (PVR: 2.4 Woods units), Normal LVEDP.   Marland Kitchen CARDIAC CATHETERIZATION N/A 04/15/2015   Procedure: Right/Left Heart Cath and Coronary Angiography;  Surgeon: Taylor Hampshire, MD;  Location: [facility];  Service: Cardiovascular;  Laterality: N/A;  . CATARACT EXTRACTION Right   . CHOLECYSTECTOMY    . ELECTROPHYSIOLOGIC STUDY N/A 03/02/2015   Procedure: Cardioversion;  Surgeon: Taylor Hampshire, MD;  Location: [facility];  Service: Cardiovascular;  Laterality: N/A;  . MASTECTOMY Right   . PLACEMENT OF BREAST IMPLANTS     s/p mastectomy  . TEE WITHOUT CARDIOVERSION N/A 04/15/2015   Procedure: TRANSESOPHAGEAL ECHOCARDIOGRAM (TEE);  Surgeon:  Taylor Spark, MD;  Location: [place] [facility];  Service: Cardiovascular;  Laterality: N/A;  . TOTAL ABDOMINAL HYSTERECTOMY       Current Outpatient Prescriptions  Medication Sig Dispense Refill  . acetaminophen (TYLENOL) 325 MG tablet Take 650 mg by mouth every 6 (six) hours as needed for pain    . digoxin (LANOXIN) 0.125 MG tablet Take 1 tablet (0.125 mg total) by mouth daily. 30 tablet 3  . furosemide (LASIX) 40 MG tablet Take 1 tablet (40  mg total) by mouth daily. (Patient taking differently: Take 20 mg by mouth daily. ) 30 tablet 3  . meclizine (ANTIVERT) 25 MG tablet Take 25 mg by mouth 2 (two) times daily as needed for dizziness or nausea.     . metoprolol tartrate (LOPRESSOR) 25 MG tablet Take 1 tablet (25 mg total) by mouth 3 (three) times daily. 270 tablet 3  . nitroGLYCERIN (NITROSTAT) 0.4 MG SL tablet Place 0.4 mg under the tongue every 5 (five) minutes as needed for chest pain.    Marland Kitchen ondansetron (ZOFRAN) 4 MG tablet Take 1 tablet (4 mg total) by mouth every 8 (eight) hours as needed for nausea or vomiting. 20 tablet 0  . potassium chloride SA (K-DUR,KLOR-CON) 20 MEQ tablet Take 1 tablet (20 mEq total) by mouth daily. 30 tablet 3  . XARELTO 20 MG TABS tablet TAKE 1 TABLET BY MOUTH DAILY (Patient taking differently: TAKE 20 MG BY MOUTH DAILY) 90 tablet 3   No current facility-administered medications for this visit.     Allergies:   Amoxicillin-pot clavulanate; Lidocaine; Amiodarone; Clodronic acid; Darvon; Epinephrine; Levofloxacin; Nitrofuran derivatives; Pentazocine lactate; Procaine hcl; Propoxyphene; and Septra [sulfamethoxazole w/trimethoprim (co-trimoxazole)]    Social History:  The patient  reports that she quit smoking about 32 years ago. Her smoking use included Cigarettes. She has a 3.00 pack-year smoking history. She has never used smokeless tobacco. She reports that she does not drink alcohol or use drugs.   Family History:  The patient's family history includes CAD in her sister.    ROS:  Please see the history of present illness.   Otherwise, review of systems are positive for none.   All other systems are reviewed and negative.    PHYSICAL EXAM: VS:  BP 110/70 (BP Location: Left Arm, Patient Position: Sitting, Cuff Size: Normal)   Pulse 76   Ht 5\' 8"  (1.727 m)   Wt 203 lb 12 oz (92.4 kg)   BMI 30.98 kg/m  , BMI Body mass index is 30.98 kg/m. GEN: Well nourished, well developed, in no acute  distress HEENT: normal Neck: no  carotid bruits, or masses. Significant JVD Cardiac: Irregularly irregular; no rubs, or gallops. There is 2/6 holosystolic murmur at the left sternal border. She no edema  Respiratory:  clear to auscultation bilaterally, normal work of breathing GI: soft, nontender, nondistended, + BS MS: no deformity or atrophy Skin: warm and dry, no rash Neuro:  Strength and sensation are intact Psych: euthymic mood, full affect   EKG:  EKG is ordered today. The ekg ordered today demonstrates atrial fibrillation with a heart rate of 70 bpm.   Recent Labs: 04/15/2015: BUN 21; Creatinine, Ser 0.79; Potassium 5.0; Sodium 138 08/04/2015: Hemoglobin 13.6; Platelets 181    Lipid Panel No results found for: CHOL, TRIG, HDL, CHOLHDL, VLDL, LDLCALC, LDLDIRECT    Wt Readings from Last 3 Encounters:  03/04/16 203 lb 12 oz (92.4 kg)  12/03/15 214 lb 8 oz (97.3 kg)  08/04/15 210 lb (  95.3 kg)       ASSESSMENT AND PLAN:  1.  Chronic atrial fibrillation: Currently being treated with rate control.  She is doing well with rate control on current dose of metoprolol and digoxin. She is tolerating anticoagulation with Xarelto.  2.  chronic diastolic heart failure:  She appears to be euvolemic on current dose of furosemide of 20 mg once daily. The dose was increased during last visit to 40 mg once daily due to volume overload that subsequently improved.  3. Moderate rheumatic mitral stenosis: This was determined to be moderate in severity based on TEE and heart catheterization last year. This has been stable like this for many years. Continue to monitor.  4. Moderate to severe pulmonary hypertension:  due to mitral stenosis and chronic diastolic heart failure. This is stable.    Disposition:   FU with me in 6 months  Signed,  Taylor Sacramento, MD  03/04/2016 12:53 PM    [place]

## 2016-03-07 ENCOUNTER — Encounter: Payer: Self-pay | Admitting: Cardiovascular Disease

## 2016-03-07 DIAGNOSIS — Z6830 Body mass index (BMI) 30.0-30.9, adult: Secondary | ICD-10-CM | POA: Diagnosis not present

## 2016-03-07 DIAGNOSIS — R21 Rash and other nonspecific skin eruption: Secondary | ICD-10-CM | POA: Diagnosis not present

## 2016-03-10 DIAGNOSIS — L209 Atopic dermatitis, unspecified: Secondary | ICD-10-CM | POA: Diagnosis not present

## 2016-03-10 DIAGNOSIS — L853 Xerosis cutis: Secondary | ICD-10-CM | POA: Diagnosis not present

## 2016-03-18 ENCOUNTER — Telehealth: Payer: Self-pay | Admitting: Cardiovascular Disease

## 2016-03-18 ENCOUNTER — Other Ambulatory Visit: Payer: Self-pay

## 2016-03-18 MED ORDER — DIGOXIN 125 MCG PO TABS: 0.1250 mg | ORAL_TABLET | Freq: Every day | ORAL | 3 refills | Status: DC

## 2016-03-18 NOTE — Telephone Encounter (Signed)
°*  STAT* If patient is at the pharmacy, call can be transferred to refill team.   1. Which medications need to be refilled? (please list name of each medication and dose if known) digoxin 0.125 mg tab po daily   2. Which pharmacy/location (including street and city if local pharmacy) is medication to be sent to? [facility] [place]   3. Do they need a 30 day or 90 day supply? 90   PATIENT IS OUT OF MEDICATION   PATIENT SAYS SHE MAY SWITCH PHARMACY ON NEXT REFILL PLEASE ALSO MAIL PAPER RX FOR NEXT REFILL AFTER TODAYS REQUEST

## 2016-04-02 DIAGNOSIS — R21 Rash and other nonspecific skin eruption: Secondary | ICD-10-CM | POA: Diagnosis not present

## 2016-04-02 DIAGNOSIS — M542 Cervicalgia: Secondary | ICD-10-CM | POA: Diagnosis not present

## 2016-04-02 DIAGNOSIS — Z6830 Body mass index (BMI) 30.0-30.9, adult: Secondary | ICD-10-CM | POA: Diagnosis not present

## 2016-04-02 DIAGNOSIS — R1310 Dysphagia, unspecified: Secondary | ICD-10-CM | POA: Diagnosis not present

## 2016-04-06 ENCOUNTER — Other Ambulatory Visit: Payer: Self-pay

## 2016-04-08 DIAGNOSIS — H40003 Preglaucoma, unspecified, bilateral: Secondary | ICD-10-CM | POA: Diagnosis not present

## 2016-04-08 DIAGNOSIS — Z961 Presence of intraocular lens: Secondary | ICD-10-CM | POA: Diagnosis not present

## 2016-04-11 ENCOUNTER — Other Ambulatory Visit: Payer: Self-pay | Admitting: Cardiovascular Disease

## 2016-05-04 DIAGNOSIS — R110 Nausea: Secondary | ICD-10-CM | POA: Diagnosis not present

## 2016-05-04 DIAGNOSIS — L209 Atopic dermatitis, unspecified: Secondary | ICD-10-CM | POA: Diagnosis not present

## 2016-05-04 DIAGNOSIS — Z6829 Body mass index (BMI) 29.0-29.9, adult: Secondary | ICD-10-CM | POA: Diagnosis not present

## 2016-05-04 DIAGNOSIS — L0390 Cellulitis, unspecified: Secondary | ICD-10-CM | POA: Diagnosis not present

## 2016-05-11 ENCOUNTER — Telehealth: Payer: Self-pay | Admitting: Cardiovascular Disease

## 2016-05-11 NOTE — Telephone Encounter (Signed)
She has been in chronic A-fib for a while now. I don't think she is going in and out of it. HR fluctuations like this are not unusual. Even she is going in and out, that should not be a problem.

## 2016-05-11 NOTE — Telephone Encounter (Signed)
Pt reports she feel to be going in and out of afib since taking 10 day course of steroids for a spider bite. She finished steroids 2 days ago and still feels symptoms.  Reports HR easily drops to 47-60bpm then elevates to 100bmp.  She will hold metoprolol until HR increases; has no missed doses of digoxin. Pt has persistent afib and has never thought she was in rhythm but feels she is now in and out of NSR and afib.  Reviewed parameters when she should hold metoprolol w/pt who verbalized understanding.  Forward to MD to review and advise.

## 2016-05-11 NOTE — Telephone Encounter (Signed)
Reviewed information w/pt. She understands she may hold metoprolol dose if HR <60 and will then take when >60

## 2016-05-11 NOTE — Telephone Encounter (Signed)
Pt states she is going in and out of rhythm. States she has gotten bit by a spider and has been on a steroid. She thinks this medication may be causing this. States she does not take her metoprolol all the time due to her HR being so low sometimes. She asks if she should take this all the time. Please call and advise.

## 2016-05-16 DIAGNOSIS — R110 Nausea: Secondary | ICD-10-CM | POA: Diagnosis not present

## 2016-05-16 DIAGNOSIS — E039 Hypothyroidism, unspecified: Secondary | ICD-10-CM | POA: Diagnosis not present

## 2016-05-16 DIAGNOSIS — Z889 Allergy status to unspecified drugs, medicaments and biological substances status: Secondary | ICD-10-CM | POA: Diagnosis not present

## 2016-05-16 DIAGNOSIS — Z6829 Body mass index (BMI) 29.0-29.9, adult: Secondary | ICD-10-CM | POA: Diagnosis not present

## 2016-05-24 DIAGNOSIS — L280 Lichen simplex chronicus: Secondary | ICD-10-CM | POA: Diagnosis not present

## 2016-05-24 DIAGNOSIS — L853 Xerosis cutis: Secondary | ICD-10-CM | POA: Diagnosis not present

## 2016-05-24 DIAGNOSIS — L209 Atopic dermatitis, unspecified: Secondary | ICD-10-CM | POA: Diagnosis not present

## 2016-05-24 DIAGNOSIS — D485 Neoplasm of uncertain behavior of skin: Secondary | ICD-10-CM | POA: Diagnosis not present

## 2016-06-07 DIAGNOSIS — L280 Lichen simplex chronicus: Secondary | ICD-10-CM | POA: Diagnosis not present

## 2016-08-18 DIAGNOSIS — Z6828 Body mass index (BMI) 28.0-28.9, adult: Secondary | ICD-10-CM | POA: Diagnosis not present

## 2016-08-18 DIAGNOSIS — R300 Dysuria: Secondary | ICD-10-CM | POA: Diagnosis not present

## 2016-08-18 DIAGNOSIS — M545 Low back pain: Secondary | ICD-10-CM | POA: Diagnosis not present

## 2016-08-25 ENCOUNTER — Ambulatory Visit: Payer: Medicare Other | Admitting: Cardiovascular Disease

## 2016-09-02 DIAGNOSIS — Z1231 Encounter for screening mammogram for malignant neoplasm of breast: Secondary | ICD-10-CM | POA: Diagnosis not present

## 2016-09-05 ENCOUNTER — Other Ambulatory Visit: Payer: Self-pay | Admitting: Cardiovascular Disease

## 2016-09-07 DIAGNOSIS — R599 Enlarged lymph nodes, unspecified: Secondary | ICD-10-CM | POA: Diagnosis not present

## 2016-09-07 DIAGNOSIS — J019 Acute sinusitis, unspecified: Secondary | ICD-10-CM | POA: Diagnosis not present

## 2016-09-07 DIAGNOSIS — J029 Acute pharyngitis, unspecified: Secondary | ICD-10-CM | POA: Diagnosis not present

## 2016-09-18 NOTE — Progress Notes (Signed)
Cardiology Office Note   Date:  09/19/2016   ID:  Taylor Weber, DOB 07-31-1946, MRN [id number]  PCP:  Jeri Modena  Cardiologist:   Kathlyn Sacramento, MD   Chief Complaint  Patient presents with  . other    6 month follow up. Meds reviewed by the pt. verbally. "doing well."      History of Present Illness: Taylor Weber is a 71 y.o. 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 She is on anticoagulation with Xarelto without any reported side effects. In the past when she was on warfarin she had significant difficulty in maintaining therapeutic INR.   No coronary artery disease on previous cardiac catheterization. She had recurrent atrial fibrillation in spite of multiple antiarrhythmic medications and cardioversion. Thus, she is being treated with rate control. She has been doing reasonably well overall with no chest pain, shortness of breath or palpitations. She continues to complain of fatigue.  Past Medical History:  Diagnosis Date  . Atherosclerotic cerebrovascular disease    nonobstructive  . Breast cancer ([place])    remote right breast mastectomy,bilaterial implants  . Breast cancer ([place])    radical mastectomy status post billaterial breast implants and right breast implat rupture  . Carotid arterial disease ([place])   . Carotid bruit    right   . Chest pain    normal coronary angiogram in december 2008  . Chronic diastolic congestive heart failure ([place])   . Diabetes mellitus    diet controlled  . Dyspnea    chronic exertional dyspnea related to intermittant atrial  . Mild aortic regurgitation with left ventricular dilation by prior echocardiogram   . Mitral stenosis    moderate  . Persistent atrial fibrillation ([place])    post cardioversion maintain normal sinus rhythm  . Pneumonia   . Pulmonary hypertension due to mitral valve  disease    Moderate  . Rheumatic fever    as a child  . Rheumatic mitral stenosis   . Silicone leakage from breast implant   . Tricuspid regurgitation   . Vertigo     Past Surgical History:  Procedure Laterality Date  . APPENDECTOMY    . CARDIAC CATHETERIZATION  2008   no significant CAD  . CARDIAC CATHETERIZATION  04/2011   No significant CAD, moderate mitral stenosis (mean gradient: 12 mm Hg, MVA: 1.83), moderate pulmonary hypertension (PVR: 2.4 Woods units), Normal LVEDP.   Marland Kitchen CARDIAC CATHETERIZATION N/A 04/15/2015   Procedure: Right/Left Heart Cath and Coronary Angiography;  Surgeon: Wellington Hampshire, MD;  Location: [place] [facility];  Service: Cardiovascular;  Laterality: N/A;  . CATARACT EXTRACTION Right   . CHOLECYSTECTOMY    . ELECTROPHYSIOLOGIC STUDY N/A 03/02/2015   Procedure: Cardioversion;  Surgeon: Wellington Hampshire, MD;  Location: [facility];  Service: Cardiovascular;  Laterality: N/A;  . MASTECTOMY Right   . PLACEMENT OF BREAST IMPLANTS     s/p mastectomy  . TEE WITHOUT CARDIOVERSION N/A 04/15/2015   Procedure: TRANSESOPHAGEAL ECHOCARDIOGRAM (TEE);  Surgeon: Dorothy Spark, MD;  Location: [place];  Service: Cardiovascular;  Laterality: N/A;  . TOTAL ABDOMINAL HYSTERECTOMY       Current Outpatient Prescriptions  Medication Sig Dispense Refill  . acetaminophen (TYLENOL) 325 MG tablet Take 650 mg by mouth every 6 (six) hours as needed for pain    . digoxin (LANOXIN) 0.125 MG tablet Take 1 tablet (0.125 mg  total) by mouth daily. 90 tablet 3  . furosemide (LASIX) 20 MG tablet TAKE 1 TABLET BY MOUTH DAILY 90 tablet 3  . meclizine (ANTIVERT) 25 MG tablet Take 25 mg by mouth 2 (two) times daily as needed for dizziness or nausea.     . metoprolol tartrate (LOPRESSOR) 25 MG tablet TAKE 1 TABLET(25 MG) BY MOUTH THREE TIMES DAILY 270 tablet 3  . nitroGLYCERIN (NITROSTAT) 0.4 MG SL tablet Place 0.4 mg under the tongue every 5 (five) minutes as needed for chest pain.    Marland Kitchen  ondansetron (ZOFRAN) 4 MG tablet Take 1 tablet (4 mg total) by mouth every 8 (eight) hours as needed for nausea or vomiting. 20 tablet 0  . XARELTO 20 MG TABS tablet TAKE 1 TABLET BY MOUTH DAILY 90 tablet 0  . potassium chloride (K-DUR) 10 MEQ tablet Take 1 tablet (10 mEq total) by mouth daily. 90 tablet 3   No current facility-administered medications for this visit.     Allergies:   Amoxicillin-pot clavulanate; Lidocaine; Amiodarone; Clodronic acid; Darvon; Epinephrine; Levofloxacin; Nitrofuran derivatives; Pentazocine lactate; Procaine hcl; Propoxyphene; and Septra [sulfamethoxazole w/trimethoprim (co-trimoxazole)]    Social History:  The patient  reports that she quit smoking about 33 years ago. Her smoking use included Cigarettes. She has a 3.00 pack-year smoking history. She has never used smokeless tobacco. She reports that she does not drink alcohol or use drugs.   Family History:  The patient's family history includes CAD in her sister.    ROS:  Please see the history of present illness.   Otherwise, review of systems are positive for none.   All other systems are reviewed and negative.    PHYSICAL EXAM: VS:  BP 108/70 (BP Location: Left Arm, Patient Position: Sitting, Cuff Size: Normal)   Pulse (!) 55   Ht 5\' 8"  (1.727 m)   Wt 191 lb 4 oz (86.8 kg)   BMI 29.08 kg/m  , BMI Body mass index is 29.08 kg/m. GEN: Well nourished, well developed, in no acute distress  HEENT: normal  Neck: no  carotid bruits, or masses. Significant JVD Cardiac: Irregularly irregular; no rubs, or gallops. There is 2/6 holosystolic murmur at the left sternal border. She no edema  Respiratory:  clear to auscultation bilaterally, normal work of breathing GI: soft, nontender, nondistended, + BS MS: no deformity or atrophy  Skin: warm and dry, no rash Neuro:  Strength and sensation are intact Psych: euthymic mood, full affect   EKG:  EKG is ordered today. The ekg ordered today demonstrates atrial  fibrillation with Ventricular rate of 55 bpm.   Recent Labs: No results found for requested labs within last 8760 hours.    Lipid Panel No results found for: CHOL, TRIG, HDL, CHOLHDL, VLDL, LDLCALC, LDLDIRECT    Wt Readings from Last 3 Encounters:  09/19/16 191 lb 4 oz (86.8 kg)  03/04/16 203 lb 12 oz (92.4 kg)  12/03/15 214 lb 8 oz (97.3 kg)       ASSESSMENT AND PLAN:  1.  Chronic atrial fibrillation: Currently being treated with rate control.  She is doing well with rate control on current dose of metoprolol and digoxin. She is tolerating anticoagulation with Xarelto. I reviewed her labs in October which showed normal CBC and basic metabolic profile. Digoxin level was 0.8 which is optimal. Continue same medications.   2.  chronic diastolic heart failure:  She appears to be euvolemic on current dose of furosemide of 20 mg once daily.  3. Moderate rheumatic mitral stenosis: This was determined to be moderate in severity based on TEE and heart catheterization in 2016. This has been stable like this for many years. Continue to monitor.  4. Moderate to severe pulmonary hypertension:  due to mitral stenosis and chronic diastolic heart failure. This is stable.    Disposition:   FU with me in 6 months  Signed,  Kathlyn Sacramento, MD  09/19/2016 12:41 PM    [place]

## 2016-09-19 ENCOUNTER — Encounter: Payer: Self-pay | Admitting: Cardiovascular Disease

## 2016-09-19 ENCOUNTER — Ambulatory Visit (INDEPENDENT_AMBULATORY_CARE_PROVIDER_SITE_OTHER): Payer: Medicare Other | Admitting: Cardiovascular Disease

## 2016-09-19 VITALS — BP 108/70 | HR 55 | Ht 68.0 in | Wt 191.2 lb

## 2016-09-19 DIAGNOSIS — I5032 Chronic diastolic (congestive) heart failure: Secondary | ICD-10-CM

## 2016-09-19 DIAGNOSIS — I482 Chronic atrial fibrillation, unspecified: Secondary | ICD-10-CM

## 2016-09-19 MED ORDER — POTASSIUM CHLORIDE ER 10 MEQ PO TBCR: 10.0000 meq | EXTENDED_RELEASE_TABLET | Freq: Every day | ORAL | 3 refills | Status: DC

## 2016-09-19 NOTE — Patient Instructions (Addendum)
Medication Instructions: Your physician has recommended you make the following change in your medication:  1- START K-Dur (Potassium) 10 mEq (1 tablet) by mouth once a day.   Labwork: None.   Procedures/Testing: None.   Follow-Up: 6 months with Dr. Fletcher Anon   Any Additional Special Instructions Will Be Listed Below (If Applicable).  Hold Xarelto 2 days before colonoscopy and resume 1 day after.    If you need a refill on your cardiac medications before your next appointment, please call your pharmacy.

## 2016-10-03 DIAGNOSIS — I509 Heart failure, unspecified: Secondary | ICD-10-CM | POA: Diagnosis not present

## 2016-10-03 DIAGNOSIS — J40 Bronchitis, not specified as acute or chronic: Secondary | ICD-10-CM | POA: Diagnosis not present

## 2016-10-03 DIAGNOSIS — R0602 Shortness of breath: Secondary | ICD-10-CM | POA: Diagnosis not present

## 2016-10-03 DIAGNOSIS — Z853 Personal history of malignant neoplasm of breast: Secondary | ICD-10-CM | POA: Diagnosis not present

## 2016-10-03 DIAGNOSIS — I110 Hypertensive heart disease with heart failure: Secondary | ICD-10-CM | POA: Diagnosis not present

## 2016-10-03 DIAGNOSIS — I4891 Unspecified atrial fibrillation: Secondary | ICD-10-CM | POA: Diagnosis not present

## 2016-10-03 DIAGNOSIS — Z7901 Long term (current) use of anticoagulants: Secondary | ICD-10-CM | POA: Diagnosis not present

## 2016-10-03 DIAGNOSIS — J069 Acute upper respiratory infection, unspecified: Secondary | ICD-10-CM | POA: Diagnosis not present

## 2016-10-03 DIAGNOSIS — Z9049 Acquired absence of other specified parts of digestive tract: Secondary | ICD-10-CM | POA: Diagnosis not present

## 2016-10-03 DIAGNOSIS — R110 Nausea: Secondary | ICD-10-CM | POA: Diagnosis not present

## 2016-10-03 DIAGNOSIS — Z9011 Acquired absence of right breast and nipple: Secondary | ICD-10-CM | POA: Diagnosis not present

## 2016-10-03 DIAGNOSIS — K5900 Constipation, unspecified: Secondary | ICD-10-CM | POA: Diagnosis not present

## 2016-10-03 DIAGNOSIS — R05 Cough: Secondary | ICD-10-CM | POA: Diagnosis not present

## 2016-10-03 DIAGNOSIS — R1011 Right upper quadrant pain: Secondary | ICD-10-CM | POA: Diagnosis not present

## 2016-10-03 DIAGNOSIS — J209 Acute bronchitis, unspecified: Secondary | ICD-10-CM | POA: Diagnosis not present

## 2016-10-03 DIAGNOSIS — Z6828 Body mass index (BMI) 28.0-28.9, adult: Secondary | ICD-10-CM | POA: Diagnosis not present

## 2016-10-03 DIAGNOSIS — Z79899 Other long term (current) drug therapy: Secondary | ICD-10-CM | POA: Diagnosis not present

## 2016-10-07 DIAGNOSIS — K5900 Constipation, unspecified: Secondary | ICD-10-CM | POA: Diagnosis not present

## 2016-10-07 DIAGNOSIS — Z9049 Acquired absence of other specified parts of digestive tract: Secondary | ICD-10-CM | POA: Diagnosis not present

## 2016-10-07 DIAGNOSIS — R1011 Right upper quadrant pain: Secondary | ICD-10-CM | POA: Diagnosis not present

## 2016-10-07 DIAGNOSIS — R109 Unspecified abdominal pain: Secondary | ICD-10-CM | POA: Diagnosis not present

## 2016-10-07 DIAGNOSIS — N281 Cyst of kidney, acquired: Secondary | ICD-10-CM | POA: Diagnosis not present

## 2016-10-10 DIAGNOSIS — H40003 Preglaucoma, unspecified, bilateral: Secondary | ICD-10-CM | POA: Diagnosis not present

## 2016-12-05 ENCOUNTER — Other Ambulatory Visit: Payer: Self-pay | Admitting: Cardiovascular Disease

## 2016-12-05 DIAGNOSIS — K5900 Constipation, unspecified: Secondary | ICD-10-CM | POA: Diagnosis not present

## 2016-12-05 DIAGNOSIS — Z86010 Personal history of colonic polyps: Secondary | ICD-10-CM | POA: Diagnosis not present

## 2016-12-06 ENCOUNTER — Telehealth: Payer: Self-pay | Admitting: Cardiovascular Disease

## 2016-12-06 NOTE — Telephone Encounter (Signed)
Request to hold Xarelto prior to 6/11 colonoscopy with [facility], Dr. Melvenia Needles received and placed in Dr. Tyrell Antonio basket.

## 2016-12-10 DIAGNOSIS — H2102 Hyphema, left eye: Secondary | ICD-10-CM | POA: Diagnosis not present

## 2016-12-12 DIAGNOSIS — H2102 Hyphema, left eye: Secondary | ICD-10-CM | POA: Diagnosis not present

## 2016-12-15 DIAGNOSIS — H2102 Hyphema, left eye: Secondary | ICD-10-CM | POA: Diagnosis not present

## 2016-12-30 NOTE — Telephone Encounter (Signed)
Approval to hold anticoagulant 2 days prior to 6/11 colonoscopy with Dr. [facility] faxed to [phone]

## 2017-01-13 DIAGNOSIS — N281 Cyst of kidney, acquired: Secondary | ICD-10-CM | POA: Diagnosis not present

## 2017-01-13 DIAGNOSIS — R918 Other nonspecific abnormal finding of lung field: Secondary | ICD-10-CM | POA: Diagnosis not present

## 2017-01-13 DIAGNOSIS — R110 Nausea: Secondary | ICD-10-CM | POA: Diagnosis not present

## 2017-01-13 DIAGNOSIS — N1330 Unspecified hydronephrosis: Secondary | ICD-10-CM | POA: Diagnosis not present

## 2017-01-13 DIAGNOSIS — K869 Disease of pancreas, unspecified: Secondary | ICD-10-CM | POA: Diagnosis not present

## 2017-01-13 DIAGNOSIS — Z6828 Body mass index (BMI) 28.0-28.9, adult: Secondary | ICD-10-CM | POA: Diagnosis not present

## 2017-01-13 DIAGNOSIS — R1011 Right upper quadrant pain: Secondary | ICD-10-CM | POA: Diagnosis not present

## 2017-01-13 DIAGNOSIS — I700 Atherosclerosis of aorta: Secondary | ICD-10-CM | POA: Diagnosis not present

## 2017-01-14 ENCOUNTER — Emergency Department (HOSPITAL_COMMUNITY)
Admission: EM | Admit: 2017-01-14 | Discharge: 2017-01-14 | Disposition: A | Discharge status: Home / Self Care | Payer: Medicare Other | Attending: Emergency Medicine | Admitting: Emergency Medicine

## 2017-01-14 ENCOUNTER — Encounter (HOSPITAL_COMMUNITY): Payer: Self-pay | Admitting: Emergency Medicine

## 2017-01-14 DIAGNOSIS — I110 Hypertensive heart disease with heart failure: Secondary | ICD-10-CM | POA: Diagnosis not present

## 2017-01-14 DIAGNOSIS — R109 Unspecified abdominal pain: Secondary | ICD-10-CM | POA: Insufficient documentation

## 2017-01-14 DIAGNOSIS — Z79899 Other long term (current) drug therapy: Secondary | ICD-10-CM | POA: Diagnosis not present

## 2017-01-14 DIAGNOSIS — R1011 Right upper quadrant pain: Secondary | ICD-10-CM | POA: Diagnosis not present

## 2017-01-14 DIAGNOSIS — E119 Type 2 diabetes mellitus without complications: Secondary | ICD-10-CM | POA: Insufficient documentation

## 2017-01-14 DIAGNOSIS — I2510 Atherosclerotic heart disease of native coronary artery without angina pectoris: Secondary | ICD-10-CM | POA: Insufficient documentation

## 2017-01-14 DIAGNOSIS — Z87891 Personal history of nicotine dependence: Secondary | ICD-10-CM | POA: Diagnosis not present

## 2017-01-14 DIAGNOSIS — I5032 Chronic diastolic (congestive) heart failure: Secondary | ICD-10-CM | POA: Insufficient documentation

## 2017-01-14 LAB — URINALYSIS, DIPSTICK ONLY
Bilirubin Urine: NEGATIVE
GLUCOSE, UA: NEGATIVE mg/dL
Ketones, ur: NEGATIVE mg/dL
Leukocytes, UA: NEGATIVE
Nitrite: NEGATIVE
PROTEIN: 30 mg/dL — AB
Specific Gravity, Urine: 1.031 — ABNORMAL HIGH (ref 1.005–1.030)
pH: 5 (ref 5.0–8.0)

## 2017-01-14 LAB — COMPREHENSIVE METABOLIC PANEL
ALBUMIN: 3.6 g/dL (ref 3.5–5.0)
ALK PHOS: 46 U/L (ref 38–126)
ALT: 14 U/L (ref 14–54)
AST: 19 U/L (ref 15–41)
Anion gap: 8 (ref 5–15)
BILIRUBIN TOTAL: 1.7 mg/dL — AB (ref 0.3–1.2)
BUN: 15 mg/dL (ref 6–20)
CO2: 23 mmol/L (ref 22–32)
Calcium: 8.9 mg/dL (ref 8.9–10.3)
Chloride: 103 mmol/L (ref 101–111)
Creatinine, Ser: 0.74 mg/dL (ref 0.44–1.00)
GFR calc Af Amer: >60 mL/min (ref >60)
GFR calc non Af Amer: >60 mL/min (ref >60)
GLUCOSE: 136 mg/dL — AB (ref 65–99)
POTASSIUM: 3.9 mmol/L (ref 3.5–5.1)
Sodium: 134 mmol/L — ABNORMAL LOW (ref 135–145)
TOTAL PROTEIN: 7 g/dL (ref 6.5–8.1)

## 2017-01-14 LAB — CBC
HEMATOCRIT: 38.7 % (ref 36.0–46.0)
HEMOGLOBIN: 13 g/dL (ref 12.0–15.0)
MCH: 29.1 pg (ref 26.0–34.0)
MCHC: 33.6 g/dL (ref 30.0–36.0)
MCV: 86.8 fL (ref 78.0–100.0)
Platelets: 154 10*3/uL (ref 150–400)
RBC: 4.46 MIL/uL (ref 3.87–5.11)
RDW: 14.1 % (ref 11.5–15.5)
WBC: 9.2 10*3/uL (ref 4.0–10.5)

## 2017-01-14 NOTE — ED Triage Notes (Signed)
Pt c/o right upper abdominal pain with nausea onset 2 nights ago. Pt seen by PMD for same and was sent to [facility] for CT scan. 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

## 2017-01-14 NOTE — ED Notes (Signed)
MD at bedside. 

## 2017-01-14 NOTE — ED Notes (Signed)
md AT BEDSIDE,.

## 2017-01-14 NOTE — ED Provider Notes (Signed)
[facility] DEPT Provider Note   CSN: [id number] Arrival date & time: 01/14/17  0841     History   Chief Complaint Chief Complaint  Patient presents with  . Abdominal Pain    HPI Taylor Weber is a 71 y.o. female.Complain of right-sided mid abdominal pain since for one week. Patient seen at [facility] yesterday which she wear she had CT scan of abdomen and pelvis showing a 17 mm low attenuation lesion of the pancreas, larger than prior studies with no dilatation of the main pancreatic duct also had mild right hydronephrosis without associated high diarrhea diet or no obstructing stone or mass lesion. Today pain is mild. Nothing makes symptoms better or worse. No other associated symptoms. No nausea or vomiting no fever.  HPI  Past Medical History:  Diagnosis Date  . Atherosclerotic cerebrovascular disease    nonobstructive  . Breast cancer ([place])    remote right breast mastectomy,bilaterial implants  . Breast cancer ([place])    radical mastectomy status post billaterial breast implants and right breast implat rupture  . Carotid arterial disease ([place])   . Carotid bruit    right   . Chest pain    normal coronary angiogram in december 2008  . Chronic diastolic congestive heart failure ([place])   . Diabetes mellitus    diet controlled  . Dyspnea    chronic exertional dyspnea related to intermittant atrial  . Mild aortic regurgitation with left ventricular dilation by prior echocardiogram   . Mitral stenosis    moderate  . Persistent atrial fibrillation ([place])    post cardioversion maintain normal sinus rhythm  . Pneumonia   . 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 Rheumatic fever    as a child  . Rheumatic mitral stenosis   . Silicone leakage from breast implant   . Tricuspid regurgitation   . Vertigo     Patient Active Problem List   Diagnosis Date Noted  . Rectal bleeding 08/04/2015  . Tricuspid regurgitation 04/17/2015  .  Rheumatic mitral stenosis 04/17/2015  . Chronic diastolic congestive heart failure (HCC)   . MS (mitral stenosis)   . Persistent atrial fibrillation ([place]) 02/27/2015  . Physical deconditioning 12/08/2014  . Pulmonary hypertension ([place]) 11/14/2013  . Mitral stenosis   . Dyspnea 01/20/2011  . COUGH 11/25/2009  . BREAST CANCER 06/02/2009  . HYPERTENSION, PULMONARY 08/11/2008  . RHEUMATIC FEVER, HX OF 08/11/2008  . Atrial fibrillation ([place]) 07/02/2008  . ALOPECIA 07/02/2008  . MASTECTOMY, HX OF 07/02/2008    Past Surgical History:  Procedure Laterality Date  . APPENDECTOMY    . CARDIAC CATHETERIZATION  2008   no significant CAD  . CARDIAC CATHETERIZATION  04/2011   No significant CAD, moderate mitral stenosis (mean gradient: 12 mm Hg, MVA: 1.83), moderate pulmonary hypertension (PVR: 2.4 Woods units), Normal LVEDP.   Marland Kitchen CARDIAC CATHETERIZATION N/A 04/15/2015   Procedure: Right/Left Heart Cath and Coronary Angiography;  Surgeon: Wellington Hampshire, MD;  Location: [facility];  Service: Cardiovascular;  Laterality: N/A;  . CATARACT EXTRACTION Right   . CHOLECYSTECTOMY    . ELECTROPHYSIOLOGIC STUDY N/A 03/02/2015   Procedure: Cardioversion;  Surgeon: Wellington Hampshire, MD;  Location: [facility];  Service: Cardiovascular;  Laterality: N/A;  . MASTECTOMY Right   . PLACEMENT OF BREAST IMPLANTS     s/p mastectomy  . TEE WITHOUT CARDIOVERSION N/A 04/15/2015   Procedure: TRANSESOPHAGEAL ECHOCARDIOGRAM (TEE);  Surgeon: Dorothy Spark, MD;  Location: [facility];  Service: Cardiovascular;  Laterality: N/A;  . TOTAL ABDOMINAL HYSTERECTOMY     Hysterectomy cholecystectomy OB History    No data available       Home Medications    Prior to Admission medications   Medication Sig Start Date End Date Taking? Authorizing Provider  acetaminophen (TYLENOL) 325 MG tablet Take 650 mg by mouth every 6 (six) hours as needed for pain    [provider]  digoxin (LANOXIN) 0.125 MG tablet  Take 1 tablet (0.125 mg total) by mouth daily. 03/18/16   Wellington Hampshire, MD  furosemide (LASIX) 20 MG tablet TAKE 1 TABLET BY MOUTH DAILY 04/12/16   Wellington Hampshire, MD  meclizine (ANTIVERT) 25 MG tablet Take 25 mg by mouth 2 (two) times daily as needed for dizziness or nausea.  02/16/13   [provider]  metoprolol tartrate (LOPRESSOR) 25 MG tablet TAKE 1 TABLET(25 MG) BY MOUTH THREE TIMES DAILY 04/12/16   Wellington Hampshire, MD  nitroGLYCERIN (NITROSTAT) 0.4 MG SL tablet Place 0.4 mg under the tongue every 5 (five) minutes as needed for chest pain.    [provider]  ondansetron (ZOFRAN) 4 MG tablet Take 1 tablet (4 mg total) by mouth every 8 (eight) hours as needed for nausea or vomiting. 03/02/15   Wellington Hampshire, MD  potassium chloride (K-DUR) 10 MEQ tablet Take 1 tablet (10 mEq total) by mouth daily. 09/19/16   Wellington Hampshire, MD  XARELTO 20 MG TABS tablet TAKE 1 TABLET BY MOUTH DAILY 12/06/16   Wellington Hampshire, MD    Family History Family History  Problem Relation Age of Onset  . CAD Sister     Social History Social History  Substance Use Topics  . Smoking status: Former Smoker    Packs/day: 0.30    Years: 10.00    Types: Cigarettes    Quit date: 08/09/1983  . Smokeless tobacco: Never Used  . Alcohol use No     Allergies   Amoxicillin-pot clavulanate; Lidocaine; Amiodarone; Clodronic acid; Darvon; Epinephrine; Levofloxacin; Nitrofuran derivatives; Pentazocine lactate; Procaine hcl; Propoxyphene; and Septra [sulfamethoxazole w/trimethoprim (co-trimoxazole)]   Review of Systems Review of Systems  Constitutional: Negative.   HENT: Negative.   Respiratory: Negative.   Cardiovascular: Negative.   Gastrointestinal: Positive for abdominal pain.  Musculoskeletal: Negative.   Skin: Negative.   Neurological: Negative.   Psychiatric/Behavioral: Negative.   All other systems reviewed and are negative.    Physical Exam Updated Vital Signs BP 127/71 (BP  Location: Left Arm)   Pulse 80   Temp 97.8 F (36.6 C) (Oral)   Resp 17   Ht 5\' 8"  (1.727 m)   Wt 86.6 kg (191 lb)   SpO2 99%   BMI 29.04 kg/m   Physical Exam  Constitutional: She appears well-developed and well-nourished.  HENT:  Head: Normocephalic and atraumatic.  Eyes: Conjunctivae are normal. Pupils are equal, round, and reactive to light.  Neck: Neck supple. No tracheal deviation present. No thyromegaly present.  Cardiovascular: Normal rate.   No murmur heard. Irregularly irregular  Pulmonary/Chest: Effort normal and breath sounds normal.  Abdominal: Soft. Bowel sounds are normal. She exhibits no distension. There is tenderness.  Minimally tender at right mid abdomen  Musculoskeletal: Normal range of motion. She exhibits no edema or tenderness.  Neurological: She is alert. Coordination normal.  Skin: Skin is warm and dry. No rash noted.  Psychiatric: She has a normal mood and affect.  Nursing note and  vitals reviewed.    ED Treatments / Results  Labs (all labs ordered are listed, but only abnormal results are displayed) Labs Reviewed  CBC  COMPREHENSIVE METABOLIC PANEL  URINALYSIS, DIPSTICK ONLY    EKG  EKG Interpretation None      Results for orders placed or performed during the hospital encounter of 01/14/17  CBC  Result Value Ref Range   WBC 9.2 4.0 - 10.5 K/uL   RBC 4.46 3.87 - 5.11 MIL/uL   Hemoglobin 13.0 12.0 - 15.0 g/dL   HCT 38.7 36.0 - 46.0 %   MCV 86.8 78.0 - 100.0 fL   MCH 29.1 26.0 - 34.0 pg   MCHC 33.6 30.0 - 36.0 g/dL   RDW 14.1 11.5 - 15.5 %   Platelets 154 150 - 400 K/uL  Comprehensive metabolic panel  Result Value Ref Range   Sodium 134 (L) 135 - 145 mmol/L   Potassium 3.9 3.5 - 5.1 mmol/L   Chloride 103 101 - 111 mmol/L   CO2 23 22 - 32 mmol/L   Glucose, Bld 136 (H) 65 - 99 mg/dL   BUN 15 6 - 20 mg/dL   Creatinine, Ser 0.74 0.44 - 1.00 mg/dL   Calcium 8.9 8.9 - 10.3 mg/dL   Total Protein 7.0 6.5 - 8.1 g/dL   Albumin 3.6  3.5 - 5.0 g/dL   AST 19 15 - 41 U/L   ALT 14 14 - 54 U/L   Alkaline Phosphatase 46 38 - 126 U/L   Total Bilirubin 1.7 (H) 0.3 - 1.2 mg/dL   GFR calc non Af Amer >60 >60 mL/min   GFR calc Af Amer >60 >60 mL/min   Anion gap 8 5 - 15  Urinalysis, dipstick only  Result Value Ref Range   Color, Urine YELLOW YELLOW   APPearance HAZY (A) CLEAR   Specific Gravity, Urine 1.031 (H) 1.005 - 1.030   pH 5.0 5.0 - 8.0   Glucose, UA NEGATIVE NEGATIVE mg/dL   Hgb urine dipstick SMALL (A) NEGATIVE   Bilirubin Urine NEGATIVE NEGATIVE   Ketones, ur NEGATIVE NEGATIVE mg/dL   Protein, ur 30 (A) NEGATIVE mg/dL   Nitrite NEGATIVE NEGATIVE   Leukocytes, UA NEGATIVE NEGATIVE   No results found. Patient reports she had lab work done at her doctor's office at [facility] I attempted to call [facility], no answer. Radiology No results found.  Procedures Procedures (including critical care time)  Medications Ordered in ED Medications - No data to display   Initial Impression / Assessment and Plan / ED Course  I have reviewed the triage vital signs and the nursing notes.  Pertinent labs & imaging results that were available during my care of the patient were reviewed by me and considered in my medical decision making (see chart for details).     Declines pain medicine 10:50 AM patient remains comfortable. Plan follow-up with PMD in 2 days. Tylenol as needed for pain. Final Clinical Impressions(s) / ED Diagnoses  Diagnosis right-sided abdominal pain Final diagnoses:  None    New Prescriptions New Prescriptions   No medications on file     Orlie Dakin, MD 01/14/17 1056

## 2017-01-14 NOTE — Discharge Instructions (Signed)
Call your primary care physician at [facility] in 2 days to arrange for follow-up. Your primary care physician can arrange for you have an outpatient MRI scan of your abdomen to check her pancreas. This should be done within the next few weeks. Return if pain not well controlled or if concern for any reason. It is safe to take Tylenol as directed for pain

## 2017-01-16 DIAGNOSIS — K869 Disease of pancreas, unspecified: Secondary | ICD-10-CM | POA: Diagnosis not present

## 2017-01-16 DIAGNOSIS — Z6828 Body mass index (BMI) 28.0-28.9, adult: Secondary | ICD-10-CM | POA: Diagnosis not present

## 2017-01-18 ENCOUNTER — Other Ambulatory Visit: Payer: Self-pay | Admitting: Physician Assistant

## 2017-01-18 DIAGNOSIS — K869 Disease of pancreas, unspecified: Secondary | ICD-10-CM

## 2017-01-24 ENCOUNTER — Other Ambulatory Visit: Payer: Self-pay | Admitting: Cardiovascular Disease

## 2017-02-03 ENCOUNTER — Ambulatory Visit
Admission: RE | Admit: 2017-02-03 | Discharge: 2017-02-03 | Disposition: A | Discharge status: Home / Self Care | Payer: Medicare Other | Source: Ambulatory Visit | Attending: Physician Assistant | Admitting: Physician Assistant

## 2017-02-03 DIAGNOSIS — K869 Disease of pancreas, unspecified: Secondary | ICD-10-CM | POA: Diagnosis not present

## 2017-02-03 MED ORDER — GADOBENATE DIMEGLUMINE 529 MG/ML IV SOLN
18.0000 mL | Freq: Once | INTRAVENOUS | Status: AC | PRN
  Administered 2017-02-03: 18 mL via INTRAVENOUS

## 2017-02-13 DIAGNOSIS — L209 Atopic dermatitis, unspecified: Secondary | ICD-10-CM | POA: Diagnosis not present

## 2017-02-13 DIAGNOSIS — L853 Xerosis cutis: Secondary | ICD-10-CM | POA: Diagnosis not present

## 2017-03-03 DIAGNOSIS — K869 Disease of pancreas, unspecified: Secondary | ICD-10-CM | POA: Diagnosis not present

## 2017-03-03 DIAGNOSIS — Z6828 Body mass index (BMI) 28.0-28.9, adult: Secondary | ICD-10-CM | POA: Diagnosis not present

## 2017-03-03 DIAGNOSIS — J019 Acute sinusitis, unspecified: Secondary | ICD-10-CM | POA: Diagnosis not present

## 2017-03-06 ENCOUNTER — Other Ambulatory Visit: Payer: Self-pay | Admitting: Cardiovascular Disease

## 2017-03-23 DIAGNOSIS — S80862A Insect bite (nonvenomous), left lower leg, initial encounter: Secondary | ICD-10-CM | POA: Diagnosis not present

## 2017-03-23 DIAGNOSIS — S80861A Insect bite (nonvenomous), right lower leg, initial encounter: Secondary | ICD-10-CM | POA: Diagnosis not present

## 2017-03-23 DIAGNOSIS — L209 Atopic dermatitis, unspecified: Secondary | ICD-10-CM | POA: Diagnosis not present

## 2017-03-23 DIAGNOSIS — L853 Xerosis cutis: Secondary | ICD-10-CM | POA: Diagnosis not present

## 2017-04-24 DIAGNOSIS — Z6828 Body mass index (BMI) 28.0-28.9, adult: Secondary | ICD-10-CM | POA: Diagnosis not present

## 2017-04-24 DIAGNOSIS — M545 Low back pain: Secondary | ICD-10-CM | POA: Diagnosis not present

## 2017-04-24 DIAGNOSIS — K869 Disease of pancreas, unspecified: Secondary | ICD-10-CM | POA: Diagnosis not present

## 2017-04-24 DIAGNOSIS — R21 Rash and other nonspecific skin eruption: Secondary | ICD-10-CM | POA: Diagnosis not present

## 2017-04-28 ENCOUNTER — Other Ambulatory Visit: Payer: Self-pay | Admitting: Cardiovascular Disease

## 2017-05-04 DIAGNOSIS — R197 Diarrhea, unspecified: Secondary | ICD-10-CM | POA: Diagnosis not present

## 2017-05-04 DIAGNOSIS — R21 Rash and other nonspecific skin eruption: Secondary | ICD-10-CM | POA: Diagnosis not present

## 2017-05-04 DIAGNOSIS — R1084 Generalized abdominal pain: Secondary | ICD-10-CM | POA: Diagnosis not present

## 2017-05-04 DIAGNOSIS — Z6826 Body mass index (BMI) 26.0-26.9, adult: Secondary | ICD-10-CM | POA: Diagnosis not present

## 2017-05-04 DIAGNOSIS — R634 Abnormal weight loss: Secondary | ICD-10-CM | POA: Diagnosis not present

## 2017-06-05 ENCOUNTER — Other Ambulatory Visit: Payer: Self-pay | Admitting: *Deleted

## 2017-06-05 ENCOUNTER — Telehealth: Payer: Self-pay | Admitting: Cardiovascular Disease

## 2017-06-05 MED ORDER — FUROSEMIDE 20 MG PO TABS: 20.0000 mg | ORAL_TABLET | Freq: Every day | ORAL | 0 refills | Status: DC

## 2017-06-05 MED ORDER — RIVAROXABAN 20 MG PO TABS: 20.0000 mg | ORAL_TABLET | Freq: Every day | ORAL | 0 refills | Status: DC

## 2017-06-05 MED ORDER — DIGOXIN 125 MCG PO TABS: 125.0000 ug | ORAL_TABLET | Freq: Every day | ORAL | 0 refills | Status: DC

## 2017-06-05 NOTE — Telephone Encounter (Signed)
Requested Prescriptions   Signed Prescriptions Disp Refills  . furosemide (LASIX) 20 MG tablet 30 tablet 0    Sig: Take 1 tablet (20 mg total) by mouth daily.    Authorizing Provider: Kathlyn Sacramento A    Ordering User: Othelia Pulling C  . digoxin (DIGOX) 0.125 MG tablet 90 tablet 0    Sig: Take 1 tablet (125 mcg total) by mouth daily.    Authorizing Provider: Kathlyn Sacramento A    Ordering User: Britt Bottom rivaroxaban (XARELTO) 20 MG TABS tablet 90 tablet 0    Sig: Take 1 tablet (20 mg total) by mouth daily.    Authorizing Provider: Kathlyn Sacramento A    Ordering User: Britt Bottom

## 2017-06-05 NOTE — Telephone Encounter (Signed)
°*  STAT* If patient is at the pharmacy, call can be transferred to refill team.   1. Which medications need to be refilled? (please list name of each medication and dose if known)  digoxin Lasix  Xarelto   2. Which pharmacy/location (including street and city if local pharmacy) is medication to be sent to? [facility] [place] [place]   3. Do they need a 30 day or 90 day supply? [place]

## 2017-06-07 ENCOUNTER — Other Ambulatory Visit: Payer: Self-pay

## 2017-06-07 MED ORDER — RIVAROXABAN 20 MG PO TABS: 20.0000 mg | ORAL_TABLET | Freq: Every day | ORAL | 0 refills | Status: DC

## 2017-07-08 ENCOUNTER — Other Ambulatory Visit: Payer: Self-pay | Admitting: Cardiovascular Disease

## 2017-08-02 ENCOUNTER — Ambulatory Visit (INDEPENDENT_AMBULATORY_CARE_PROVIDER_SITE_OTHER): Payer: Medicare Other | Admitting: Cardiovascular Disease

## 2017-08-02 ENCOUNTER — Encounter: Payer: Self-pay | Admitting: Cardiovascular Disease

## 2017-08-02 VITALS — BP 128/70 | HR 65 | Ht 68.0 in | Wt 185.5 lb

## 2017-08-02 DIAGNOSIS — I482 Chronic atrial fibrillation, unspecified: Secondary | ICD-10-CM

## 2017-08-02 DIAGNOSIS — I050 Rheumatic mitral stenosis: Secondary | ICD-10-CM

## 2017-08-02 DIAGNOSIS — I5032 Chronic diastolic (congestive) heart failure: Secondary | ICD-10-CM | POA: Diagnosis not present

## 2017-08-02 MED ORDER — FUROSEMIDE 20 MG PO TABS: ORAL_TABLET | ORAL | 3 refills | Status: DC

## 2017-08-02 MED ORDER — DIGOXIN 125 MCG PO TABS: 125.0000 ug | ORAL_TABLET | Freq: Every day | ORAL | 3 refills | Status: DC

## 2017-08-02 MED ORDER — RIVAROXABAN 20 MG PO TABS: 20.0000 mg | ORAL_TABLET | Freq: Every day | ORAL | 3 refills | Status: DC

## 2017-08-02 MED ORDER — METOPROLOL TARTRATE 25 MG PO TABS: ORAL_TABLET | ORAL | 3 refills | Status: DC

## 2017-08-02 NOTE — Progress Notes (Signed)
Cardiology Office Note   Date:  08/02/2017   ID:  Taylor Weber, DOB 1945/10/17, MRN [id number]  PCP:  Tawni Carnes, PA-C  Cardiologist:   Kathlyn Sacramento, MD   Chief Complaint  Patient presents with  . Other    6 month follow up. Patient c/o chest pain and swelling in ankles.  Meds reviewed verbally with patient.      History of Present Illness: Taylor Weber is a 71 y.o. 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 She is on anticoagulation with Xarelto without any reported side effects. In the past when she was on warfarin she had significant difficulty in maintaining therapeutic INR.   No coronary artery disease on previous cardiac catheterization. She had recurrent atrial fibrillation in spite of multiple antiarrhythmic medications and cardioversion. Thus, she is being treated with rate control.  Overall, she has been doing reasonably well from a cardiac standpoint with no chest pain, shortness of breath or palpitations.  However, she continues to complain of exertional fatigue.  She also reports weight loss which is being investigated by her primary care physician.  She was found to have multiple liver and renal cysts.  She is supposed to get colonoscopy done.  Past Medical History:  Diagnosis Date  . Atherosclerotic cerebrovascular disease    nonobstructive  . Breast cancer ([place])    remote right breast mastectomy,bilaterial implants  . Breast cancer ([place])    radical mastectomy status post billaterial breast implants and right breast implat rupture  . Carotid arterial disease ([place])   . Carotid bruit    right   . Chest pain    normal coronary angiogram in december 2008  . Chronic diastolic congestive heart failure ([place])   . Diabetes mellitus    diet controlled  . Dyspnea    chronic exertional dyspnea related to intermittant atrial  .  Mild aortic regurgitation with left ventricular dilation by prior echocardiogram   . Mitral stenosis    moderate  . Persistent atrial fibrillation ([place])    post cardioversion maintain normal sinus rhythm  . Pneumonia   . 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 Rheumatic fever    as a child  . Rheumatic mitral stenosis   . Silicone leakage from breast implant   . Tricuspid regurgitation   . Vertigo     Past Surgical History:  Procedure Laterality Date  . APPENDECTOMY    . CARDIAC CATHETERIZATION  2008   no significant CAD  . CARDIAC CATHETERIZATION  04/2011   No significant CAD, moderate mitral stenosis (mean gradient: 12 mm Hg, MVA: 1.83), moderate pulmonary hypertension (PVR: 2.4 Woods units), Normal LVEDP.   Marland Kitchen CARDIAC CATHETERIZATION N/A 04/15/2015   Procedure: Right/Left Heart Cath and Coronary Angiography;  Surgeon: Wellington Hampshire, MD;  Location: [facility];  Service: Cardiovascular;  Laterality: N/A;  . CATARACT EXTRACTION Right   . CHOLECYSTECTOMY    . ELECTROPHYSIOLOGIC STUDY N/A 03/02/2015   Procedure: Cardioversion;  Surgeon: Wellington Hampshire, MD;  Location: [facility];  Service: Cardiovascular;  Laterality: N/A;  . MASTECTOMY Right   . PLACEMENT OF BREAST IMPLANTS     s/p mastectomy  . TEE WITHOUT CARDIOVERSION N/A 04/15/2015   Procedure: TRANSESOPHAGEAL ECHOCARDIOGRAM (TEE);  Surgeon: Dorothy Spark, MD;  Location: [facility];  Service: Cardiovascular;  Laterality: N/A;  . TOTAL  ABDOMINAL HYSTERECTOMY       Current Outpatient Medications  Medication Sig Dispense Refill  . digoxin (DIGOX) 0.125 MG tablet Take 1 tablet (125 mcg total) by mouth daily. 90 tablet 0  . furosemide (LASIX) 20 MG tablet TAKE 1 TABLET(20 MG) BY MOUTH DAILY 30 tablet 0  . metoprolol tartrate (LOPRESSOR) 25 MG tablet TAKE 1 TABLET(25 MG) BY MOUTH THREE TIMES DAILY 270 tablet 3  . rivaroxaban (XARELTO) 20 MG TABS tablet Take 1 tablet (20 mg total) by  mouth daily. 90 tablet 0   No current facility-administered medications for this visit.     Allergies:   Amoxicillin-pot clavulanate; Lidocaine; Amiodarone; Clodronic acid; Darvon; Epinephrine; Levofloxacin; Nitrofuran derivatives; Pentazocine lactate; Procaine hcl; Propoxyphene; and Septra [sulfamethoxazole w/trimethoprim (co-trimoxazole)]    Social History:  The patient  reports that she quit smoking about 34 years ago. Her smoking use included cigarettes. She has a 3.00 pack-year smoking history. she has never used smokeless tobacco. She reports that she does not drink alcohol or use drugs.   Family History:  The patient's family history includes CAD in her sister.    ROS:  Please see the history of present illness.   Otherwise, review of systems are positive for none.   All other systems are reviewed and negative.    PHYSICAL EXAM: VS:  BP 128/70 (BP Location: Left Arm, Patient Position: Sitting, Cuff Size: Normal)   Pulse 65   Ht 5\' 8"  (1.727 m)   Wt 185 lb 8 oz (84.1 kg)   BMI 28.21 kg/m  , BMI Body mass index is 28.21 kg/m. GEN: Well nourished, well developed, in no acute distress  HEENT: normal  Neck: no  carotid bruits, or masses. Significant JVD Cardiac: Irregularly irregular; no rubs, or gallops. There is 2/6 holosystolic murmur at the left sternal border. She no edema  Respiratory:  clear to auscultation bilaterally, normal work of breathing GI: soft, nontender, nondistended, + BS MS: no deformity or atrophy  Skin: warm and dry, no rash Neuro:  Strength and sensation are intact Psych: euthymic mood, full affect   EKG:  EKG is ordered today. The ekg ordered today demonstrates atrial fibrillation with ventricular rate of 65 bpm.   Recent Labs: 01/14/2017: ALT 14; BUN 15; Creatinine, Ser 0.74; Hemoglobin 13.0; Platelets 154; Potassium 3.9; Sodium 134    Lipid Panel No results found for: CHOL, TRIG, HDL, CHOLHDL, VLDL, LDLCALC, LDLDIRECT    Wt Readings from Last  3 Encounters:  08/02/17 185 lb 8 oz (84.1 kg)  01/14/17 191 lb (86.6 kg)  09/19/16 191 lb 4 oz (86.8 kg)       ASSESSMENT AND PLAN:  1.  Chronic atrial fibrillation: Currently being treated with rate control.  She is doing well with rate control on current dose of metoprolol and digoxin. She is tolerating anticoagulation with Xarelto.  I requested CBC, basic metabolic profile and digoxin level to be done today.  2.  chronic diastolic heart failure:  She appears to be euvolemic on current dose of furosemide of 20 mg once daily.   3. Moderate rheumatic mitral stenosis: I requested a follow-up echocardiogram given that the most recent evaluation was in 2016.  4. Moderate to severe pulmonary hypertension:  due to mitral stenosis and chronic diastolic heart failure. This is stable.    Disposition:   FU with me in 6 months  Signed,  Kathlyn Sacramento, MD  08/02/2017 11:33 AM    [place]

## 2017-08-02 NOTE — Patient Instructions (Addendum)
Medication Instructions:  Your physician recommends that you continue on your current medications as directed. Please refer to the Current Medication list given to you today. You have been provided written prescriptions for your medication refills.    Labwork: BMET, CBC, digoxin level today  Testing/Procedures: Your physician has requested that you have an echocardiogram. Echocardiography is a painless test that uses sound waves to create images of your heart. It provides your doctor with information about the size and shape of your heart and how well your heart's chambers and valves are working. This procedure takes approximately one hour. There are no restrictions for this procedure.    Follow-Up: Your physician wants you to follow-up in: 6 months with Dr. Fletcher Anon.  You will receive a reminder letter in the mail two months in advance. If you don't receive a letter, please call our office to schedule the follow-up appointment.   Any Other Special Instructions Will Be Listed Below (If Applicable).     If you need a refill on your cardiac medications before your next appointment, please call your pharmacy.  Echocardiogram An echocardiogram, or echocardiography, uses sound waves (ultrasound) to produce an image of your heart. The echocardiogram is simple, painless, obtained within a short period of time, and offers valuable information to your health care provider. The images from an echocardiogram can provide information such as:  Evidence of coronary artery disease (CAD).  Heart size.  Heart muscle function.  Heart valve function.  Aneurysm detection.  Evidence of a past heart attack.  Fluid buildup around the heart.  Heart muscle thickening.  Assess heart valve function.  Tell a health care provider about:  Any allergies you have.  All medicines you are taking, including vitamins, herbs, eye drops, creams, and over-the-counter medicines.  Any problems you or family  members have had with anesthetic medicines.  Any blood disorders you have.  Any surgeries you have had.  Any medical conditions you have.  Whether you are pregnant or may be pregnant. What happens before the procedure? No special preparation is needed. Eat and drink normally. What happens during the procedure?  In order to produce an image of your heart, gel will be applied to your chest and a wand-like tool (transducer) will be moved over your chest. The gel will help transmit the sound waves from the transducer. The sound waves will harmlessly bounce off your heart to allow the heart images to be captured in real-time motion. These images will then be recorded.  You may need an IV to receive a medicine that improves the quality of the pictures. What happens after the procedure? You may return to your normal schedule including diet, activities, and medicines, unless your health care provider tells you otherwise. This information is not intended to replace advice given to you by your health care provider. Make sure you discuss any questions you have with your health care provider. Document Released: 07/22/2000 Document Revised: 03/12/2016 Document Reviewed: 04/01/2013 Elsevier Interactive Patient Education  2017 [employer].

## 2017-08-03 LAB — BASIC METABOLIC PANEL
BUN/Creatinine Ratio: 21 (ref 12–28)
BUN: 16 mg/dL (ref 8–27)
CALCIUM: 9.8 mg/dL (ref 8.7–10.3)
CHLORIDE: 102 mmol/L (ref 96–106)
CO2: 19 mmol/L — ABNORMAL LOW (ref 20–29)
CREATININE: 0.77 mg/dL (ref 0.57–1.00)
GFR calc non Af Amer: 77 mL/min/{1.73_m2} (ref >59)
GFR, EST AFRICAN AMERICAN: 89 mL/min/{1.73_m2} (ref >59)
Glucose: 96 mg/dL (ref 65–99)
Potassium: 4.7 mmol/L (ref 3.5–5.2)
Sodium: 140 mmol/L (ref 134–144)

## 2017-08-03 LAB — CBC
HEMOGLOBIN: 13.3 g/dL (ref 11.1–15.9)
Hematocrit: 38.1 % (ref 34.0–46.6)
MCH: 29.5 pg (ref 26.6–33.0)
MCHC: 34.9 g/dL (ref 31.5–35.7)
MCV: 85 fL (ref 79–97)
PLATELETS: 175 10*3/uL (ref 150–379)
RBC: 4.51 x10E6/uL (ref 3.77–5.28)
RDW: 14.4 % (ref 12.3–15.4)
WBC: 9.8 10*3/uL (ref 3.4–10.8)

## 2017-08-03 LAB — DIGOXIN LEVEL: DIGOXIN, SERUM: 0.8 ng/mL (ref 0.5–0.9)

## 2017-08-03 NOTE — Addendum Note (Signed)
Addended by: Janan Ridge on: 08/03/2017 01:14 PM   Modules accepted: Orders

## 2017-08-04 ENCOUNTER — Telehealth: Payer: Self-pay | Admitting: Cardiovascular Disease

## 2017-08-04 NOTE — Telephone Encounter (Signed)
Pt retuning our call for lab results  Please call back

## 2017-08-04 NOTE — Telephone Encounter (Signed)
See lab results.  

## 2017-08-18 ENCOUNTER — Ambulatory Visit (INDEPENDENT_AMBULATORY_CARE_PROVIDER_SITE_OTHER): Payer: Medicare Other

## 2017-08-18 ENCOUNTER — Other Ambulatory Visit: Payer: Self-pay

## 2017-08-18 DIAGNOSIS — I482 Chronic atrial fibrillation, unspecified: Secondary | ICD-10-CM

## 2017-08-18 DIAGNOSIS — I050 Rheumatic mitral stenosis: Secondary | ICD-10-CM | POA: Diagnosis not present

## 2017-08-23 DIAGNOSIS — Z7901 Long term (current) use of anticoagulants: Secondary | ICD-10-CM | POA: Diagnosis not present

## 2017-08-23 DIAGNOSIS — S299XXA Unspecified injury of thorax, initial encounter: Secondary | ICD-10-CM | POA: Diagnosis not present

## 2017-08-23 DIAGNOSIS — I4891 Unspecified atrial fibrillation: Secondary | ICD-10-CM | POA: Diagnosis not present

## 2017-08-23 DIAGNOSIS — M549 Dorsalgia, unspecified: Secondary | ICD-10-CM | POA: Diagnosis not present

## 2017-08-23 DIAGNOSIS — Z87891 Personal history of nicotine dependence: Secondary | ICD-10-CM | POA: Diagnosis not present

## 2017-08-23 DIAGNOSIS — Z79899 Other long term (current) drug therapy: Secondary | ICD-10-CM | POA: Diagnosis not present

## 2017-08-23 DIAGNOSIS — M545 Low back pain: Secondary | ICD-10-CM | POA: Diagnosis not present

## 2017-08-23 DIAGNOSIS — Z853 Personal history of malignant neoplasm of breast: Secondary | ICD-10-CM | POA: Diagnosis not present

## 2017-08-23 DIAGNOSIS — M542 Cervicalgia: Secondary | ICD-10-CM | POA: Diagnosis not present

## 2017-08-23 DIAGNOSIS — M25512 Pain in left shoulder: Secondary | ICD-10-CM | POA: Diagnosis not present

## 2017-08-23 DIAGNOSIS — M25511 Pain in right shoulder: Secondary | ICD-10-CM | POA: Diagnosis not present

## 2017-08-23 DIAGNOSIS — I509 Heart failure, unspecified: Secondary | ICD-10-CM | POA: Diagnosis not present

## 2017-08-30 ENCOUNTER — Other Ambulatory Visit: Payer: Self-pay

## 2017-08-30 MED ORDER — RIVAROXABAN 20 MG PO TABS: 20.0000 mg | ORAL_TABLET | Freq: Every day | ORAL | 3 refills | Status: DC

## 2017-09-05 DIAGNOSIS — K869 Disease of pancreas, unspecified: Secondary | ICD-10-CM | POA: Diagnosis not present

## 2017-09-13 DIAGNOSIS — K862 Cyst of pancreas: Secondary | ICD-10-CM | POA: Diagnosis not present

## 2017-09-20 DIAGNOSIS — J111 Influenza due to unidentified influenza virus with other respiratory manifestations: Secondary | ICD-10-CM | POA: Diagnosis not present

## 2017-09-20 DIAGNOSIS — R110 Nausea: Secondary | ICD-10-CM | POA: Diagnosis not present

## 2017-09-20 DIAGNOSIS — Z6827 Body mass index (BMI) 27.0-27.9, adult: Secondary | ICD-10-CM | POA: Diagnosis not present

## 2017-09-26 DIAGNOSIS — M79602 Pain in left arm: Secondary | ICD-10-CM | POA: Diagnosis not present

## 2017-09-26 DIAGNOSIS — Z6827 Body mass index (BMI) 27.0-27.9, adult: Secondary | ICD-10-CM | POA: Diagnosis not present

## 2017-10-23 ENCOUNTER — Other Ambulatory Visit: Payer: Self-pay | Admitting: Cardiovascular Disease

## 2017-11-06 DIAGNOSIS — H26493 Other secondary cataract, bilateral: Secondary | ICD-10-CM | POA: Diagnosis not present

## 2017-11-06 DIAGNOSIS — Z961 Presence of intraocular lens: Secondary | ICD-10-CM | POA: Diagnosis not present

## 2017-11-15 DIAGNOSIS — Z6828 Body mass index (BMI) 28.0-28.9, adult: Secondary | ICD-10-CM | POA: Diagnosis not present

## 2017-11-15 DIAGNOSIS — N644 Mastodynia: Secondary | ICD-10-CM | POA: Diagnosis not present

## 2017-11-15 DIAGNOSIS — M25562 Pain in left knee: Secondary | ICD-10-CM | POA: Diagnosis not present

## 2017-12-06 DIAGNOSIS — Z85038 Personal history of other malignant neoplasm of large intestine: Secondary | ICD-10-CM | POA: Diagnosis not present

## 2017-12-06 DIAGNOSIS — R1013 Epigastric pain: Secondary | ICD-10-CM | POA: Diagnosis not present

## 2017-12-11 ENCOUNTER — Telehealth: Payer: Self-pay | Admitting: Cardiovascular Disease

## 2017-12-11 NOTE — Telephone Encounter (Signed)
Patient is scheduled for colonoscopy on 5/9 Patient would like to know when to stop Taylor Weber and for how long She was told by the doctor who is doing procedure to stop 2 days prior Would like to check with cardiologist Please call to discuss

## 2017-12-11 NOTE — Telephone Encounter (Signed)
2 days

## 2017-12-11 NOTE — Telephone Encounter (Signed)
S/w patient. She verbalized understanding to stop Xarelto for 2 days prior to colonoscopy.

## 2017-12-11 NOTE — Telephone Encounter (Signed)
Routing to Dr Fletcher Anon.

## 2017-12-14 DIAGNOSIS — Z888 Allergy status to other drugs, medicaments and biological substances status: Secondary | ICD-10-CM | POA: Diagnosis not present

## 2017-12-14 DIAGNOSIS — Z1211 Encounter for screening for malignant neoplasm of colon: Secondary | ICD-10-CM | POA: Diagnosis not present

## 2017-12-14 DIAGNOSIS — Z85038 Personal history of other malignant neoplasm of large intestine: Secondary | ICD-10-CM | POA: Diagnosis not present

## 2017-12-14 DIAGNOSIS — R1013 Epigastric pain: Secondary | ICD-10-CM | POA: Diagnosis not present

## 2017-12-14 DIAGNOSIS — I4891 Unspecified atrial fibrillation: Secondary | ICD-10-CM | POA: Diagnosis not present

## 2017-12-14 DIAGNOSIS — K2950 Unspecified chronic gastritis without bleeding: Secondary | ICD-10-CM | POA: Diagnosis not present

## 2017-12-14 DIAGNOSIS — Z882 Allergy status to sulfonamides status: Secondary | ICD-10-CM | POA: Diagnosis not present

## 2017-12-14 DIAGNOSIS — E119 Type 2 diabetes mellitus without complications: Secondary | ICD-10-CM | POA: Diagnosis not present

## 2017-12-14 DIAGNOSIS — Z885 Allergy status to narcotic agent status: Secondary | ICD-10-CM | POA: Diagnosis not present

## 2017-12-14 DIAGNOSIS — Z79899 Other long term (current) drug therapy: Secondary | ICD-10-CM | POA: Diagnosis not present

## 2017-12-14 DIAGNOSIS — K635 Polyp of colon: Secondary | ICD-10-CM | POA: Diagnosis not present

## 2017-12-14 DIAGNOSIS — Z880 Allergy status to penicillin: Secondary | ICD-10-CM | POA: Diagnosis not present

## 2017-12-14 DIAGNOSIS — K2970 Gastritis, unspecified, without bleeding: Secondary | ICD-10-CM | POA: Diagnosis not present

## 2017-12-14 DIAGNOSIS — R1030 Lower abdominal pain, unspecified: Secondary | ICD-10-CM | POA: Diagnosis not present

## 2017-12-14 DIAGNOSIS — K317 Polyp of stomach and duodenum: Secondary | ICD-10-CM | POA: Diagnosis not present

## 2017-12-14 DIAGNOSIS — D125 Benign neoplasm of sigmoid colon: Secondary | ICD-10-CM | POA: Diagnosis not present

## 2017-12-14 DIAGNOSIS — Z86010 Personal history of colonic polyps: Secondary | ICD-10-CM | POA: Diagnosis not present

## 2017-12-14 DIAGNOSIS — Z881 Allergy status to other antibiotic agents status: Secondary | ICD-10-CM | POA: Diagnosis not present

## 2017-12-20 DIAGNOSIS — N644 Mastodynia: Secondary | ICD-10-CM | POA: Diagnosis not present

## 2017-12-20 DIAGNOSIS — R928 Other abnormal and inconclusive findings on diagnostic imaging of breast: Secondary | ICD-10-CM | POA: Diagnosis not present

## 2017-12-28 DIAGNOSIS — Z1211 Encounter for screening for malignant neoplasm of colon: Secondary | ICD-10-CM | POA: Diagnosis not present

## 2017-12-28 DIAGNOSIS — R1013 Epigastric pain: Secondary | ICD-10-CM | POA: Diagnosis not present

## 2017-12-28 DIAGNOSIS — Z800 Family history of malignant neoplasm of digestive organs: Secondary | ICD-10-CM | POA: Diagnosis not present

## 2017-12-28 DIAGNOSIS — K635 Polyp of colon: Secondary | ICD-10-CM | POA: Diagnosis not present

## 2018-01-24 ENCOUNTER — Other Ambulatory Visit: Payer: Self-pay | Admitting: Cardiovascular Disease

## 2018-01-25 ENCOUNTER — Other Ambulatory Visit: Payer: Self-pay | Admitting: Cardiovascular Disease

## 2018-02-14 ENCOUNTER — Telehealth: Payer: Self-pay | Admitting: Cardiovascular Disease

## 2018-02-14 ENCOUNTER — Other Ambulatory Visit: Payer: Self-pay

## 2018-02-14 DIAGNOSIS — R42 Dizziness and giddiness: Secondary | ICD-10-CM

## 2018-02-14 DIAGNOSIS — Z79899 Other long term (current) drug therapy: Secondary | ICD-10-CM

## 2018-02-14 DIAGNOSIS — I4819 Other persistent atrial fibrillation: Secondary | ICD-10-CM

## 2018-02-14 MED ORDER — METOPROLOL TARTRATE 25 MG PO TABS: ORAL_TABLET | ORAL | 0 refills | Status: DC

## 2018-02-14 MED ORDER — FUROSEMIDE 20 MG PO TABS: ORAL_TABLET | ORAL | 0 refills | Status: DC

## 2018-02-14 MED ORDER — DIGOXIN 125 MCG PO TABS: 125.0000 ug | ORAL_TABLET | Freq: Every day | ORAL | 0 refills | Status: DC

## 2018-02-14 NOTE — Telephone Encounter (Signed)
I called and spoke with the patient. She states that for the majority of the time she is in atrial fibrillation. However, over the last 2 days, she feels she has been going in and out of rhythm much more frequently and the back and forth has been making her feel more SOB with increased dizziness. She feels like she has been getting chest pain when she goes from sinus to a-fib and she just doesn't feel well when she reverts from a-fib to sinus.  Confirmed the patient's medications. She held her xarelto about a month ago for a couple of days prior to a colonoscopy. Otherwise, she has not missed any medications. Her BP/ HR's have been running 118/60 & 70-80 bpm (she will get up to ~ 100-110 with walking on the sidewalk).  She is scheduled for follow up with Dr. Fletcher Anon in September as she declined to see a PA/ NP.  I advised her that I will review the above with Dr. Fletcher Anon and we will call her back. She voices understanding and is agreeable.

## 2018-02-14 NOTE — Telephone Encounter (Signed)
Pt states she is not feeling good, states she has been having dizzy spells, states this has been going on for a couple days. States she is also having some SOB. States she is going in and out of rhythm.  Please call to discuss.

## 2018-02-14 NOTE — Telephone Encounter (Signed)
Requested Prescriptions   Signed Prescriptions Disp Refills  . furosemide (LASIX) 20 MG tablet 90 tablet 0    Sig: TAKE 1 TABLET(20 MG) BY MOUTH DAILY    Authorizing Provider: Kathlyn Sacramento A    Ordering User: Dezaray Shibuya C  . digoxin (DIGOX) 0.125 MG tablet 90 tablet 0    Sig: Take 1 tablet (125 mcg total) by mouth daily.    Authorizing Provider: Kathlyn Sacramento A    Ordering User: Eugenio Hoes, Evon Lopezperez C  . metoprolol tartrate (LOPRESSOR) 25 MG tablet 270 tablet 0    Sig: TAKE 1 TABLET(25 MG) BY MOUTH THREE TIMES DAILY    Authorizing Provider: Kathlyn Sacramento A    Ordering User: Britt Bottom

## 2018-02-14 NOTE — Telephone Encounter (Signed)
Requested Prescriptions   Signed Prescriptions Disp Refills  . furosemide (LASIX) 20 MG tablet 90 tablet 0    Sig: TAKE 1 TABLET(20 MG) BY MOUTH DAILY    Authorizing Provider: Kathlyn Sacramento A    Ordering User: Delrae Hagey C  . digoxin (DIGOX) 0.125 MG tablet 90 tablet 0    Sig: Take 1 tablet (125 mcg total) by mouth daily.    Authorizing Provider: Kathlyn Sacramento A    Ordering User: Eugenio Hoes, Viral Schramm C  . metoprolol tartrate (LOPRESSOR) 25 MG tablet 270 tablet 0    Sig: TAKE 1 TABLET(25 MG) BY MOUTH THREE TIMES DAILY    Authorizing Provider: Kathlyn Sacramento A    Ordering User: Britt Bottom

## 2018-02-14 NOTE — Telephone Encounter (Signed)
*  STAT* If patient is at the pharmacy, call can be transferred to refill team.   1. Which medications need to be refilled? (please list name of each medication and dose if known) Digoxin, Furosemide, Metoprolo  2. Which pharmacy/location (including street and city if local pharmacy) is medication to be sent to? [place], [place]  3. Do they need a 30 day or 90 day supply? [place]

## 2018-02-16 NOTE — Telephone Encounter (Signed)
I called and spoke with the patient. She states that she has not had any lab work checked by her PCP in the last 6 months. She is agreeable with labs and wearing a ZIO for 2 weeks.  She is aware that I will have our schedulers contact her to arrange an appointment for labs & a ZIO on the same day. She is agreeable.

## 2018-02-16 NOTE — Telephone Encounter (Signed)
If she has not had labs done in the last 6 months with her primary care physician I recommend checking CBC, basic metabolic profile and digoxin level.  We should get a 2-week ZIO patch monitor to evaluate rate control of atrial fibrillation.

## 2018-03-05 DIAGNOSIS — J019 Acute sinusitis, unspecified: Secondary | ICD-10-CM | POA: Diagnosis not present

## 2018-03-05 DIAGNOSIS — H547 Unspecified visual loss: Secondary | ICD-10-CM | POA: Diagnosis not present

## 2018-03-05 DIAGNOSIS — Z6828 Body mass index (BMI) 28.0-28.9, adult: Secondary | ICD-10-CM | POA: Diagnosis not present

## 2018-03-05 DIAGNOSIS — H8111 Benign paroxysmal vertigo, right ear: Secondary | ICD-10-CM | POA: Diagnosis not present

## 2018-03-06 DIAGNOSIS — H3562 Retinal hemorrhage, left eye: Secondary | ICD-10-CM | POA: Diagnosis not present

## 2018-03-08 ENCOUNTER — Other Ambulatory Visit: Payer: 59

## 2018-03-08 DIAGNOSIS — H353231 Exudative age-related macular degeneration, bilateral, with active choroidal neovascularization: Secondary | ICD-10-CM | POA: Diagnosis not present

## 2018-03-08 DIAGNOSIS — H26491 Other secondary cataract, right eye: Secondary | ICD-10-CM | POA: Diagnosis not present

## 2018-03-12 ENCOUNTER — Ambulatory Visit (INDEPENDENT_AMBULATORY_CARE_PROVIDER_SITE_OTHER): Payer: Medicare Other

## 2018-03-12 ENCOUNTER — Other Ambulatory Visit (INDEPENDENT_AMBULATORY_CARE_PROVIDER_SITE_OTHER): Payer: Medicare Other | Admitting: *Deleted

## 2018-03-12 DIAGNOSIS — R42 Dizziness and giddiness: Secondary | ICD-10-CM | POA: Diagnosis not present

## 2018-03-12 DIAGNOSIS — Z79899 Other long term (current) drug therapy: Secondary | ICD-10-CM | POA: Diagnosis not present

## 2018-03-12 DIAGNOSIS — I481 Persistent atrial fibrillation: Secondary | ICD-10-CM

## 2018-03-12 DIAGNOSIS — I4819 Other persistent atrial fibrillation: Secondary | ICD-10-CM

## 2018-03-13 LAB — CBC WITH DIFFERENTIAL/PLATELET
Basophils Absolute: 0 10*3/uL (ref 0.0–0.2)
Basos: 0 %
EOS (ABSOLUTE): 0.2 10*3/uL (ref 0.0–0.4)
EOS: 2 %
HEMATOCRIT: 38.1 % (ref 34.0–46.6)
Hemoglobin: 12.9 g/dL (ref 11.1–15.9)
IMMATURE GRANS (ABS): 0.1 10*3/uL (ref 0.0–0.1)
IMMATURE GRANULOCYTES: 1 %
LYMPHS: 17 %
Lymphocytes Absolute: 1.5 10*3/uL (ref 0.7–3.1)
MCH: 28.9 pg (ref 26.6–33.0)
MCHC: 33.9 g/dL (ref 31.5–35.7)
MCV: 85 fL (ref 79–97)
Monocytes Absolute: 0.9 10*3/uL (ref 0.1–0.9)
Monocytes: 10 %
Neutrophils Absolute: 6.2 10*3/uL (ref 1.4–7.0)
Neutrophils: 70 %
PLATELETS: 154 10*3/uL (ref 150–450)
RBC: 4.46 x10E6/uL (ref 3.77–5.28)
RDW: 14.6 % (ref 12.3–15.4)
WBC: 8.8 10*3/uL (ref 3.4–10.8)

## 2018-03-13 LAB — BASIC METABOLIC PANEL
BUN/Creatinine Ratio: 17 (ref 12–28)
BUN: 13 mg/dL (ref 8–27)
CALCIUM: 9.3 mg/dL (ref 8.7–10.3)
CO2: 23 mmol/L (ref 20–29)
Chloride: 105 mmol/L (ref 96–106)
Creatinine, Ser: 0.75 mg/dL (ref 0.57–1.00)
GFR calc Af Amer: 91 mL/min/{1.73_m2} (ref >59)
GFR calc non Af Amer: 79 mL/min/{1.73_m2} (ref >59)
GLUCOSE: 109 mg/dL — AB (ref 65–99)
Potassium: 4.4 mmol/L (ref 3.5–5.2)
Sodium: 137 mmol/L (ref 134–144)

## 2018-03-13 LAB — DIGOXIN LEVEL: Digoxin, Serum: 0.7 ng/mL (ref 0.5–0.9)

## 2018-03-19 ENCOUNTER — Encounter: Payer: Self-pay | Admitting: *Deleted

## 2018-03-22 DIAGNOSIS — M25531 Pain in right wrist: Secondary | ICD-10-CM | POA: Diagnosis not present

## 2018-03-22 DIAGNOSIS — Z6828 Body mass index (BMI) 28.0-28.9, adult: Secondary | ICD-10-CM | POA: Diagnosis not present

## 2018-03-22 DIAGNOSIS — M79641 Pain in right hand: Secondary | ICD-10-CM | POA: Diagnosis not present

## 2018-03-22 DIAGNOSIS — I890 Lymphedema, not elsewhere classified: Secondary | ICD-10-CM | POA: Diagnosis not present

## 2018-03-22 DIAGNOSIS — L0390 Cellulitis, unspecified: Secondary | ICD-10-CM | POA: Diagnosis not present

## 2018-03-23 DIAGNOSIS — M25531 Pain in right wrist: Secondary | ICD-10-CM | POA: Diagnosis not present

## 2018-03-23 DIAGNOSIS — M11231 Other chondrocalcinosis, right wrist: Secondary | ICD-10-CM | POA: Diagnosis not present

## 2018-03-23 DIAGNOSIS — M79641 Pain in right hand: Secondary | ICD-10-CM | POA: Diagnosis not present

## 2018-03-28 DIAGNOSIS — I4891 Unspecified atrial fibrillation: Secondary | ICD-10-CM | POA: Diagnosis not present

## 2018-03-29 ENCOUNTER — Telehealth: Payer: Self-pay | Admitting: *Deleted

## 2018-03-29 NOTE — Telephone Encounter (Addendum)
Patient called and verified that she is taking her Xarelto 20 mg daily and Metoprolol 25 mg tid. Her monitor results came back with atrial fibrillation.   The patient does have chronic afib and she has been asymptomatic.

## 2018-04-04 DIAGNOSIS — Z1389 Encounter for screening for other disorder: Secondary | ICD-10-CM | POA: Diagnosis not present

## 2018-04-04 DIAGNOSIS — Z6827 Body mass index (BMI) 27.0-27.9, adult: Secondary | ICD-10-CM | POA: Diagnosis not present

## 2018-04-04 DIAGNOSIS — I890 Lymphedema, not elsewhere classified: Secondary | ICD-10-CM | POA: Diagnosis not present

## 2018-04-04 DIAGNOSIS — R079 Chest pain, unspecified: Secondary | ICD-10-CM | POA: Diagnosis not present

## 2018-04-04 DIAGNOSIS — Z1331 Encounter for screening for depression: Secondary | ICD-10-CM | POA: Diagnosis not present

## 2018-04-04 DIAGNOSIS — R0789 Other chest pain: Secondary | ICD-10-CM | POA: Diagnosis not present

## 2018-04-18 DIAGNOSIS — N644 Mastodynia: Secondary | ICD-10-CM | POA: Diagnosis not present

## 2018-04-18 DIAGNOSIS — N6489 Other specified disorders of breast: Secondary | ICD-10-CM | POA: Diagnosis not present

## 2018-04-19 ENCOUNTER — Encounter: Payer: Self-pay | Admitting: Cardiovascular Disease

## 2018-04-19 ENCOUNTER — Ambulatory Visit (INDEPENDENT_AMBULATORY_CARE_PROVIDER_SITE_OTHER): Payer: Medicare Other | Admitting: Cardiovascular Disease

## 2018-04-19 VITALS — BP 110/68 | HR 78 | Ht 68.0 in | Wt 179.5 lb

## 2018-04-19 DIAGNOSIS — I050 Rheumatic mitral stenosis: Secondary | ICD-10-CM

## 2018-04-19 DIAGNOSIS — I482 Chronic atrial fibrillation, unspecified: Secondary | ICD-10-CM

## 2018-04-19 DIAGNOSIS — I5032 Chronic diastolic (congestive) heart failure: Secondary | ICD-10-CM

## 2018-04-19 DIAGNOSIS — I272 Pulmonary hypertension, unspecified: Secondary | ICD-10-CM

## 2018-04-19 MED ORDER — DIGOXIN 125 MCG PO TABS: 125.0000 ug | ORAL_TABLET | Freq: Every day | ORAL | 2 refills | Status: DC

## 2018-04-19 MED ORDER — FUROSEMIDE 20 MG PO TABS: ORAL_TABLET | ORAL | 2 refills | Status: DC

## 2018-04-19 MED ORDER — RIVAROXABAN 20 MG PO TABS: 20.0000 mg | ORAL_TABLET | Freq: Every day | ORAL | 2 refills | Status: DC

## 2018-04-19 MED ORDER — METOPROLOL TARTRATE 25 MG PO TABS: 25.0000 mg | ORAL_TABLET | Freq: Three times a day (TID) | ORAL | 2 refills | Status: DC

## 2018-04-19 NOTE — Patient Instructions (Signed)
Medication Instructions: Continue same medications.   Labwork: None.   Procedures/Testing: None.   Follow-Up: 6 months with Dr. Chavy Avera.   Any Additional Special Instructions Will Be Listed Below (If Applicable).     If you need a refill on your cardiac medications before your next appointment, please call your pharmacy.   

## 2018-04-19 NOTE — Progress Notes (Signed)
Cardiology Office Note   Date:  04/19/2018   ID:  Taylor Weber, DOB October 10, 1945, MRN [id number]  PCP:  Rosalee Kaufman, PA-C  Cardiologist:   Kathlyn Sacramento, MD   Chief Complaint  Patient presents with  . other    6 month follow up. Meds reviewed by the pt. verbally. Pt. c/o right wrist swelling with having chest pain at times.       History of Present Illness: Taylor Weber is a 72 y.o. 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 She is on anticoagulation with Xarelto without any reported side effects. In the past when she was on warfarin she had significant difficulty in maintaining therapeutic INR.   No coronary artery disease on previous cardiac catheterization.  She complained of intermittent palpitations in July.  A 2-week outpatient monitor showed atrial fibrillation with average heart rate of 76 bpm.  She was noted to have intermittent episodes of rapid ventricular response but these did not last long.  Labs in August were unremarkable including CBC and basic metabolic profile.  Digoxin level was 0.7.  She is doing reasonably well.  Stable exertional dyspnea and rare episodes of chest pain.  Past Medical History:  Diagnosis Date  . Atherosclerotic cerebrovascular disease    nonobstructive  . Breast cancer ([place])    remote right breast mastectomy,bilaterial implants  . Breast cancer ([place])    radical mastectomy status post billaterial breast implants and right breast implat rupture  . Carotid arterial disease ([place])   . Carotid bruit    right   . Chest pain    normal coronary angiogram in december 2008  . Chronic diastolic congestive heart failure ([place])   . Diabetes mellitus    diet controlled  . Dyspnea    chronic exertional dyspnea related to intermittant atrial  . Mild aortic regurgitation with left ventricular dilation by prior  echocardiogram   . Mitral stenosis    moderate  . Persistent atrial fibrillation ([place])    post cardioversion maintain normal sinus rhythm  . Pneumonia   . 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 Rheumatic fever    as a child  . Rheumatic mitral stenosis   . Silicone leakage from breast implant   . Tricuspid regurgitation   . Vertigo     Past Surgical History:  Procedure Laterality Date  . APPENDECTOMY    . CARDIAC CATHETERIZATION  2008   no significant CAD  . CARDIAC CATHETERIZATION  04/2011   No significant CAD, moderate mitral stenosis (mean gradient: 12 mm Hg, MVA: 1.83), moderate pulmonary hypertension (PVR: 2.4 [place] units), Normal LVEDP.   Marland Kitchen CARDIAC CATHETERIZATION N/A 04/15/2015   Procedure: Right/Left Heart Cath and Coronary Angiography;  Surgeon: Wellington Hampshire, MD;  Location: [facility];  Service: Cardiovascular;  Laterality: N/A;  . CATARACT EXTRACTION Right   . CHOLECYSTECTOMY    . ELECTROPHYSIOLOGIC STUDY N/A 03/02/2015   Procedure: Cardioversion;  Surgeon: Wellington Hampshire, MD;  Location: [facility];  Service: Cardiovascular;  Laterality: N/A;  . MASTECTOMY Right   . PLACEMENT OF BREAST IMPLANTS     s/p mastectomy  . TEE WITHOUT CARDIOVERSION N/A 04/15/2015   Procedure: TRANSESOPHAGEAL ECHOCARDIOGRAM (TEE);  Surgeon: Dorothy Spark, MD;  Location: [facility];  Service: Cardiovascular;  Laterality: N/A;  . TOTAL ABDOMINAL HYSTERECTOMY  Current Outpatient Medications  Medication Sig Dispense Refill  . digoxin (DIGOX) 0.125 MG tablet Take 1 tablet (125 mcg total) by mouth daily. 90 tablet 0  . furosemide (LASIX) 20 MG tablet TAKE 1 TABLET(20 MG) BY MOUTH DAILY 30 tablet 3  . metoprolol tartrate (LOPRESSOR) 25 MG tablet TAKE 1 TABLET BY MOUTH THREE TIMES DAILY 90 tablet 0  . rivaroxaban (XARELTO) 20 MG TABS tablet Take 1 tablet (20 mg total) by mouth daily. 90 tablet 3   No current facility-administered medications  for this visit.     Allergies:   Amoxicillin-pot clavulanate; Lidocaine; Other; Cefuroxime axetil; Nitrofuran derivatives; Pentazocine; Sulfa antibiotics; Procaine; Amiodarone; Clodronic acid; Darvon; Epinephrine; Levofloxacin; Pentazocine lactate; Procaine hcl; Propoxyphene; and Septra [sulfamethoxazole w/trimethoprim (co-trimoxazole)]    Social History:  The patient  reports that she quit smoking about 34 years ago. Her smoking use included cigarettes. She has a 3.00 pack-year smoking history. She has never used smokeless tobacco. She reports that she does not drink alcohol or use drugs.   Family History:  The patient's family history includes CAD in her sister.    ROS:  Please see the history of present illness.   Otherwise, review of systems are positive for none.   All other systems are reviewed and negative.    PHYSICAL EXAM: VS:  BP 110/68 (BP Location: Left Arm, Patient Position: Sitting, Cuff Size: Normal)   Pulse 78   Ht 5\' 8"  (1.727 m)   Wt 179 lb 8 oz (81.4 kg)   BMI 27.29 kg/m  , BMI Body mass index is 27.29 kg/m. GEN: Well nourished, well developed, in no acute distress  HEENT: normal  Neck: no  carotid bruits, or masses. No JVD Cardiac: Irregularly irregular; no rubs, or gallops. There is 2/6 holosystolic murmur at the left sternal border. She no edema  Respiratory:  clear to auscultation bilaterally, normal work of breathing GI: soft, nontender, nondistended, + BS MS: no deformity or atrophy  Skin: warm and dry, no rash Neuro:  Strength and sensation are intact Psych: euthymic mood, full affect   EKG:  EKG is ordered today. The ekg ordered today demonstrates atrial fibrillation with ventricular rate of 78 bpm.   Recent Labs: 03/12/2018: BUN 13; Creatinine, Ser 0.75; Hemoglobin 12.9; Platelets 154; Potassium 4.4; Sodium 137    Lipid Panel No results found for: CHOL, TRIG, HDL, CHOLHDL, VLDL, LDLCALC, LDLDIRECT    Wt Readings from Last 3 Encounters:    04/19/18 179 lb 8 oz (81.4 kg)  08/02/17 185 lb 8 oz (84.1 kg)  01/14/17 191 lb (86.6 kg)       ASSESSMENT AND PLAN:  1.  Chronic atrial fibrillation: Currently being treated with rate control.  She is doing well with rate control on current dose of metoprolol and digoxin.  Labs in August were unremarkable.  2.  chronic diastolic heart failure:  She appears to be euvolemic on current dose of furosemide of 20 mg once daily.   3. Moderate rheumatic mitral stenosis: Most recent echo in January showed stable mild mitral stenosis with a mean gradient of 3 mmHg and valve area of 1.71.  4. Moderate pulmonary hypertension: Systolic pulmonary pressure was 51 mmHg and most recent echo.    Disposition:   FU with me in 6 months  Signed,  Kathlyn Sacramento, MD  04/19/2018 1:28 PM    [place]

## 2018-04-24 ENCOUNTER — Ambulatory Visit: Payer: 59 | Admitting: Cardiovascular Disease

## 2018-04-24 DIAGNOSIS — H353231 Exudative age-related macular degeneration, bilateral, with active choroidal neovascularization: Secondary | ICD-10-CM | POA: Diagnosis not present

## 2018-05-22 DIAGNOSIS — H26493 Other secondary cataract, bilateral: Secondary | ICD-10-CM | POA: Diagnosis not present

## 2018-05-22 DIAGNOSIS — H353231 Exudative age-related macular degeneration, bilateral, with active choroidal neovascularization: Secondary | ICD-10-CM | POA: Diagnosis not present

## 2018-05-28 DIAGNOSIS — Z6827 Body mass index (BMI) 27.0-27.9, adult: Secondary | ICD-10-CM | POA: Diagnosis not present

## 2018-05-28 DIAGNOSIS — J019 Acute sinusitis, unspecified: Secondary | ICD-10-CM | POA: Diagnosis not present

## 2018-05-28 DIAGNOSIS — J029 Acute pharyngitis, unspecified: Secondary | ICD-10-CM | POA: Diagnosis not present

## 2018-06-19 ENCOUNTER — Telehealth: Payer: Self-pay | Admitting: Cardiology

## 2018-06-19 DIAGNOSIS — H353231 Exudative age-related macular degeneration, bilateral, with active choroidal neovascularization: Secondary | ICD-10-CM | POA: Diagnosis not present

## 2018-06-19 NOTE — Telephone Encounter (Signed)
Received call from patient's daughter that she was having bleeding around her eye s/p an injection at an outside opthalmologist earlier today. Patient is on Xarelto for valvular atrial fibrillation in the setting of mitral stenosis. She has had prior retinal injections without holding blood thinners and has had some bleeding before predominantly scleral, but daughter felt this may be a bit more (albeit still minimal) and wanted to ensure it was safe to take her AC.   I recommended that they reach out to her opthalmologist to gauge what the expected amount of bleeding was after the procedure to confirm that they felt safe continuing the anticoagulation as there is no documentation in EMR or care everywhere that outlines the procedure.   Abdullahi Vallone K. Marletta Lor, MD

## 2018-06-20 NOTE — Telephone Encounter (Signed)
Call placed to the patient. She stated that  from what she can see her eye is not actively bleeding but she does not know what it is doing behind the eye. She did state that she cannot see any white of her eyes due to the redness.  She is seeing the ophthalmologist tomorrow and has been made aware that she may hold her Eliquis a day or two if needed. She will call back if anything further is needed.

## 2018-06-20 NOTE — Telephone Encounter (Signed)
ok 

## 2018-06-20 NOTE — Telephone Encounter (Signed)
Please reach out to the patient and see if there is still any bleeding.  Xarelto can be held for 1 or 2 days if needed.

## 2018-06-21 DIAGNOSIS — H26491 Other secondary cataract, right eye: Secondary | ICD-10-CM | POA: Diagnosis not present

## 2018-07-17 DIAGNOSIS — H353231 Exudative age-related macular degeneration, bilateral, with active choroidal neovascularization: Secondary | ICD-10-CM | POA: Diagnosis not present

## 2018-08-02 ENCOUNTER — Ambulatory Visit (INDEPENDENT_AMBULATORY_CARE_PROVIDER_SITE_OTHER): Payer: Medicare Other | Admitting: Orthopaedic Surgery

## 2018-08-09 ENCOUNTER — Ambulatory Visit (INDEPENDENT_AMBULATORY_CARE_PROVIDER_SITE_OTHER): Payer: Medicare Other | Admitting: Orthopaedic Surgery

## 2018-08-15 DIAGNOSIS — H26491 Other secondary cataract, right eye: Secondary | ICD-10-CM | POA: Diagnosis not present

## 2018-09-10 DIAGNOSIS — Z6826 Body mass index (BMI) 26.0-26.9, adult: Secondary | ICD-10-CM | POA: Diagnosis not present

## 2018-09-10 DIAGNOSIS — M79672 Pain in left foot: Secondary | ICD-10-CM | POA: Diagnosis not present

## 2018-09-10 DIAGNOSIS — L0390 Cellulitis, unspecified: Secondary | ICD-10-CM | POA: Diagnosis not present

## 2018-09-14 DIAGNOSIS — H353231 Exudative age-related macular degeneration, bilateral, with active choroidal neovascularization: Secondary | ICD-10-CM | POA: Diagnosis not present

## 2018-10-09 DIAGNOSIS — M79671 Pain in right foot: Secondary | ICD-10-CM | POA: Diagnosis not present

## 2018-10-09 DIAGNOSIS — M25571 Pain in right ankle and joints of right foot: Secondary | ICD-10-CM | POA: Diagnosis not present

## 2018-10-09 DIAGNOSIS — E663 Overweight: Secondary | ICD-10-CM | POA: Diagnosis not present

## 2018-10-09 DIAGNOSIS — R5382 Chronic fatigue, unspecified: Secondary | ICD-10-CM | POA: Diagnosis not present

## 2018-10-09 DIAGNOSIS — M2550 Pain in unspecified joint: Secondary | ICD-10-CM | POA: Diagnosis not present

## 2018-10-09 DIAGNOSIS — Z6826 Body mass index (BMI) 26.0-26.9, adult: Secondary | ICD-10-CM | POA: Diagnosis not present

## 2018-10-25 DIAGNOSIS — M2550 Pain in unspecified joint: Secondary | ICD-10-CM | POA: Diagnosis not present

## 2018-10-25 DIAGNOSIS — M0609 Rheumatoid arthritis without rheumatoid factor, multiple sites: Secondary | ICD-10-CM | POA: Diagnosis not present

## 2018-10-25 DIAGNOSIS — Z6826 Body mass index (BMI) 26.0-26.9, adult: Secondary | ICD-10-CM | POA: Diagnosis not present

## 2018-10-25 DIAGNOSIS — R5382 Chronic fatigue, unspecified: Secondary | ICD-10-CM | POA: Diagnosis not present

## 2018-10-25 DIAGNOSIS — E663 Overweight: Secondary | ICD-10-CM | POA: Diagnosis not present

## 2018-10-25 DIAGNOSIS — M25562 Pain in left knee: Secondary | ICD-10-CM | POA: Diagnosis not present

## 2018-10-25 DIAGNOSIS — M25571 Pain in right ankle and joints of right foot: Secondary | ICD-10-CM | POA: Diagnosis not present

## 2018-10-29 ENCOUNTER — Other Ambulatory Visit: Payer: Self-pay | Admitting: Cardiovascular Disease

## 2018-10-29 MED ORDER — RIVAROXABAN 20 MG PO TABS: 20.0000 mg | ORAL_TABLET | Freq: Every day | ORAL | 0 refills | Status: DC

## 2018-10-29 NOTE — Telephone Encounter (Signed)
Xarelto 20 mg once daily, #90 w/ 0 refills sent to [facility] [place] [place].

## 2018-10-29 NOTE — Telephone Encounter (Signed)
°*  STAT* If patient is at the pharmacy, call can be transferred to refill team.   1. Which medications need to be refilled? (please list name of each medication and dose if known) Xarelto 20 MG 1 tablet daily   2. Which pharmacy/location (including street and city if local pharmacy) [facility] [place] [place]   3. Do they need a 30 day or 90 day supply? 90 day

## 2018-10-29 NOTE — Telephone Encounter (Signed)
Please review for refill, Thanks !  

## 2018-11-19 DIAGNOSIS — H353231 Exudative age-related macular degeneration, bilateral, with active choroidal neovascularization: Secondary | ICD-10-CM | POA: Diagnosis not present

## 2018-11-29 DIAGNOSIS — R0902 Hypoxemia: Secondary | ICD-10-CM | POA: Diagnosis not present

## 2018-11-29 DIAGNOSIS — R509 Fever, unspecified: Secondary | ICD-10-CM | POA: Diagnosis not present

## 2018-11-30 ENCOUNTER — Telehealth: Payer: Self-pay | Admitting: Cardiovascular Disease

## 2018-11-30 DIAGNOSIS — I4891 Unspecified atrial fibrillation: Secondary | ICD-10-CM

## 2018-11-30 NOTE — Telephone Encounter (Signed)
Spoke with the pt. Pt sts that she has been sick for 10-12 days. Pt sts that she has symptoms of COVID-19. Fever, nausea, sob. Pt sts that her O2 levels dropped to 83-86%. She was seen by her pcp yesterday. She was swabbed for COVID-19 and the test results are pending. The pt pcp started her on 2 liters O2.  Her BP has been running low her BP this morning 88/53. I asked the pt to recheck her BP while I held the line. Pt BP 95/57 89bpm. She admits that she has not been eating and drinking much due to nausea.Pt sts that she is taking all of her medications as prescribed. She is taking Tylenol for her fever.   Pt daughter is a Therapist, sports and suggested that she contact and update Dr.Arida to determine if she needs medication adjustment.   Encourage the pt to hydrate. Suggested that she drink a sports drink with electrolytes this afternoon. Adv the pt that I will fwd the update to Dr. Fletcher Anon and we will call back with his recommendation.

## 2018-11-30 NOTE — Telephone Encounter (Signed)
Decrease metoprolol to 25 mg twice daily. Hold digoxin for now as she might be dehydrated with renal failure.  She needs to increase fluid intake as you mentioned. We also need to check labs on her including basic metabolic profile, CBC and digoxin level.  This should be done as soon as possible.

## 2018-11-30 NOTE — Telephone Encounter (Signed)
Spoke with the pt and made her aware of her aware of Dr. Tyrell Antonio recommendation. Pt verbalized understanding to the instruction. Pt sts that she live in [place], [place] she will contact her pcp on Monday to see if the labs that Dr.Arida is requesting can be drawn at their office. Pt wrote down the labs requested. Pt sts that she expects to be contacted on Mon 4/27 with her COVID-19 results. Pt sts that she may be able to have labs drawn at a [facility].lab orders are in epic. Adv the pt to contact the office on Monday if additional assistance obtaining labs is needed. Pt verbalized understanding and voiced appreciation for the call.

## 2018-11-30 NOTE — Telephone Encounter (Signed)
Pt c/o BP issue: STAT if pt c/o blurred vision, one-sided weakness or slurred speech  1. What are your last 5 BP readings? Today 88/53   2. Are you having any other symptoms (ex. Dizziness, headache, blurred vision, passed out)? Dizziness, headache, blurred vision - has been sick last few days  3. What is your BP issue?  Patient calling, has been having trouble keeping BP up.  Patient takes 3 metoprolol and would like to know if she should lower the amount. Patient was just recently prescribed oxygen so it may help some.  Please call to discuss.

## 2018-12-03 ENCOUNTER — Encounter: Payer: Self-pay | Admitting: Cardiovascular Disease

## 2018-12-03 DIAGNOSIS — H6692 Otitis media, unspecified, left ear: Secondary | ICD-10-CM | POA: Diagnosis not present

## 2018-12-03 DIAGNOSIS — R5383 Other fatigue: Secondary | ICD-10-CM | POA: Diagnosis not present

## 2018-12-05 ENCOUNTER — Telehealth: Payer: Self-pay | Admitting: Cardiovascular Disease

## 2018-12-05 NOTE — Telephone Encounter (Signed)
Patient calling  States that she had her lab work completed at [facility] in [place] Would like to know if we be able to review the results with her  Please call to discuss

## 2018-12-05 NOTE — Telephone Encounter (Signed)
Call to patient. She reports PCP drew blood work on Monday and was supposed to be faxed to [place] for Dr. Fletcher Anon to review. I informed patient that it has not been received at this time.  Fax number provided, She will check with PCP and ask them to forward results.

## 2018-12-06 ENCOUNTER — Other Ambulatory Visit: Payer: Self-pay

## 2018-12-06 DIAGNOSIS — I4891 Unspecified atrial fibrillation: Secondary | ICD-10-CM

## 2018-12-06 NOTE — Telephone Encounter (Addendum)
Called patient. She verbalized understanding of Dr Tyrell Antonio recommendations. She had numerous other questions regarding her heart as well. Dr Fletcher Anon had opening tomorrow for Virtual visit. Patient accepted to do as telephone since she does not have a smartphone. She is aware of the information listed below.      Virtual Visit Pre-Appointment Phone Call  "(Name), I am calling you today to discuss your upcoming appointment. We are currently trying to limit exposure to the virus that causes COVID-19 by seeing patients at home rather than in the office."  1. "What is the BEST phone number to call the day of the visit?" - include this in appointment notes  2. Do you have or have access to (through a family member/friend) a smartphone with video capability that we can use for your visit?" a. If no - list the appointment type as a PHONE visit in appointment notes  3. Confirm consent - "In the setting of the current Covid19 crisis, you are scheduled for a (phone or video) visit with your provider on (date) at (time).  Just as we do with many in-office visits, in order for you to participate in this visit, we must obtain consent.  If you'd like, I can send this to your mychart (if signed up) or email for you to review.  Otherwise, I can obtain your verbal consent now.  All virtual visits are billed to your insurance company just like a normal visit would be.  By agreeing to a virtual visit, we'd like you to understand that the technology does not allow for your provider to perform an examination, and thus may limit your provider's ability to fully assess your condition. If your provider identifies any concerns that need to be evaluated in person, we will make arrangements to do so.  Finally, though the technology is pretty good, we cannot assure that it will always work on either your or our end, and in the setting of a video visit, we may have to convert it to a phone-only visit.  In either situation, we  cannot ensure that we have a secure connection.  Are you willing to proceed?" STAFF: Did the patient verbally acknowledge consent to telehealth visit? Document YES/NO here: yes  4. Advise patient to be prepared - "Two hours prior to your appointment, go ahead and check your blood pressure, pulse, oxygen saturation, and your weight (if you have the equipment to check those) and write them all down. When your visit starts, your provider will ask you for this information. If you have an Apple Watch or Kardia device, please plan to have heart rate information ready on the day of your appointment. Please have a pen and paper handy nearby the day of the visit as well."  5. Give patient instructions for MyChart download to smartphone OR [email or web address] as below if video visit (depending on what platform provider is using)  6. Inform patient they will receive a phone call 15 minutes prior to their appointment time (may be from unknown caller ID) so they should be prepared to answer    TELEPHONE CALL NOTE  Taylor Weber has been deemed a candidate for a follow-up tele-health visit to limit community exposure during the Covid-19 pandemic. I spoke with the patient via phone to ensure availability of phone/video source, confirm preferred email & phone number, and discuss instructions and expectations.  I reminded Taylor Weber to be prepared with any vital sign and/or heart rhythm information that could potentially be obtained  via home monitoring, at the time of her visit. I reminded Taylor Weber to expect a phone call prior to her visit.  Roney Jaffe, RN 12/06/2018 2:14 PM

## 2018-12-06 NOTE — Telephone Encounter (Signed)
Patient confirmed we received fax of labs   Please call to discuss and also sob / med change pending the results reviewv

## 2018-12-06 NOTE — Telephone Encounter (Signed)
Her labs looked unremarkable overall.  She can resume digoxin.  If blood pressure continues to be on the low side, she can stay on the lower dose of metoprolol.

## 2018-12-06 NOTE — Telephone Encounter (Signed)
To Dr. Fletcher Anon to review labs scanned in on 12/03/18 from her PCP. It sounds like she is wanting these reviewed by Dr. Fletcher Anon before we call her back.

## 2018-12-07 ENCOUNTER — Telehealth (INDEPENDENT_AMBULATORY_CARE_PROVIDER_SITE_OTHER): Payer: Medicare Other | Admitting: Cardiovascular Disease

## 2018-12-07 ENCOUNTER — Other Ambulatory Visit: Payer: Self-pay

## 2018-12-07 VITALS — BP 105/68 | HR 112 | Ht 68.5 in | Wt 175.0 lb

## 2018-12-07 DIAGNOSIS — I482 Chronic atrial fibrillation, unspecified: Secondary | ICD-10-CM

## 2018-12-07 DIAGNOSIS — I050 Rheumatic mitral stenosis: Secondary | ICD-10-CM

## 2018-12-07 NOTE — Progress Notes (Signed)
Virtual Visit via Telephone Note   This visit type was conducted due to national recommendations for restrictions regarding the COVID-19 Pandemic (e.g. social distancing) in an effort to limit this patient's exposure and mitigate transmission in our community.  Due to her co-morbid illnesses, this patient is at least at moderate risk for complications without adequate follow up.  This format is felt to be most appropriate for this patient at this time.  The patient did not have access to video technology/had technical difficulties with video requiring transitioning to audio format only (telephone).  All issues noted in this document were discussed and addressed.  No physical exam could be performed with this format.  Please refer to the patient's chart for her  consent to telehealth for [facility].   Date:  12/07/2018   ID:  Taylor Weber, DOB 24-Oct-1945, MRN [id number]  Patient Location: Home Provider Location: Office  PCP:  Rosalee Kaufman, PA-C  Cardiologist:  Kathlyn Sacramento, MD  Electrophysiologist:  None   Evaluation Performed:  Follow-Up Visit  Chief Complaint: Hypotension and hypoxia  History of Present Illness:    Taylor Weber is a 73 y.o. female who was evaluated via phone visit today regarding recent hypotension and hypoxia. 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 She is on anticoagulation with Xarelto without any reported side effects. In the past when she was on warfarin she had significant difficulty in maintaining therapeutic INR.   No coronary artery disease on previous cardiac catheterization.  Over the last few weeks, the patient had multiple symptoms including abdominal pain, nausea, vomiting, diarrhea and loss of sense of taste.  She also had low-grade fever.  She was tested for COVID-19 and was negative but she had symptoms highly suggestive of it.  She started  having low blood pressure and called our office due to that.  I sent her for routine labs which showed no evidence of volume depletion or anemia.  I decreased the dose of metoprolol to 25 mg twice daily.  Initially I held digoxin until we got the results back but her digoxin level was below 0.4. She developed hypoxia and was placed on 2 L of oxygen.  She has no history of COPD.  She feels better but continues to use oxygen.  The patient does have symptoms concerning for COVID-19 infection (fever, chills, cough, or new shortness of breath).    Past Medical History:  Diagnosis Date  . Atherosclerotic cerebrovascular disease    nonobstructive  . Breast cancer ([place])    remote right breast mastectomy,bilaterial implants  . Breast cancer ([place])    radical mastectomy status post billaterial breast implants and right breast implat rupture  . Carotid arterial disease ([place])   . Carotid bruit    right   . Chest pain    normal coronary angiogram in december 2008  . Chronic diastolic congestive heart failure ([place])   . Diabetes mellitus    diet controlled  . Dyspnea    chronic exertional dyspnea related to intermittant atrial  . Mild aortic regurgitation with left ventricular dilation by prior echocardiogram   . Mitral stenosis    moderate  . Persistent atrial fibrillation ([place])    post cardioversion maintain normal sinus rhythm  . Pneumonia   . 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 Rheumatic fever    as a child  . Rheumatic mitral  stenosis   . Silicone leakage from breast implant   . Tricuspid regurgitation   . Vertigo    Past Surgical History:  Procedure Laterality Date  . APPENDECTOMY    . CARDIAC CATHETERIZATION  2008   no significant CAD  . CARDIAC CATHETERIZATION  04/2011   No significant CAD, moderate mitral stenosis (mean gradient: 12 mm Hg, MVA: 1.83), moderate pulmonary hypertension (PVR: 2.4 Woods units), Normal LVEDP.   Marland Kitchen CARDIAC  CATHETERIZATION N/A 04/15/2015   Procedure: Right/Left Heart Cath and Coronary Angiography;  Surgeon: Wellington Hampshire, MD;  Location: [place] CV LAB;  Service: Cardiovascular;  Laterality: N/A;  . CATARACT EXTRACTION Right   . CHOLECYSTECTOMY    . ELECTROPHYSIOLOGIC STUDY N/A 03/02/2015   Procedure: Cardioversion;  Surgeon: Wellington Hampshire, MD;  Location: [facility] ORS;  Service: Cardiovascular;  Laterality: N/A;  . MASTECTOMY Right   . PLACEMENT OF BREAST IMPLANTS     s/p mastectomy  . TEE WITHOUT CARDIOVERSION N/A 04/15/2015   Procedure: TRANSESOPHAGEAL ECHOCARDIOGRAM (TEE);  Surgeon: Dorothy Spark, MD;  Location: [facility] ENDOSCOPY;  Service: Cardiovascular;  Laterality: N/A;  . TOTAL ABDOMINAL HYSTERECTOMY       Current Meds  Medication Sig  . digoxin (DIGOX) 0.125 MG tablet Take 1 tablet (125 mcg total) by mouth daily.  . furosemide (LASIX) 20 MG tablet TAKE 1 TABLET(20 MG) BY MOUTH DAILY  . metoprolol tartrate (LOPRESSOR) 25 MG tablet Take 1 tablet (25 mg total) by mouth 3 (three) times daily. (Patient taking differently: Take 25 mg by mouth 2 (two) times daily. )  . rivaroxaban (XARELTO) 20 MG TABS tablet Take 1 tablet (20 mg total) by mouth daily.     Allergies:   Amoxicillin-pot clavulanate; Lidocaine; Other; Cefuroxime axetil; Nitrofuran derivatives; Pentazocine; Sulfa antibiotics; Amiodarone; Clodronic acid; Darvon; Epinephrine; Levofloxacin; Pentazocine lactate; Procaine; Procaine hcl; Propoxyphene; and Septra [sulfamethoxazole w/trimethoprim (co-trimoxazole)]   Social History   Tobacco Use  . Smoking status: Former Smoker    Packs/day: 0.30    Years: 10.00    Pack years: 3.00    Types: Cigarettes    Last attempt to quit: 08/09/1983    Years since quitting: 35.3  . Smokeless tobacco: Never Used  Substance Use Topics  . Alcohol use: No  . Drug use: No     Family Hx: The patient's family history includes CAD in her sister.  ROS:   Please see the history of present  illness.     All other systems reviewed and are negative.   Prior CV studies:   The following studies were reviewed today:  Reviewed results of Holter monitor from last year  Labs/Other Tests and Data Reviewed:    EKG:  No ECG reviewed.  Recent Labs: 03/12/2018: BUN 13; Creatinine, Ser 0.75; Hemoglobin 12.9; Platelets 154; Potassium 4.4; Sodium 137   Recent Lipid Panel No results found for: CHOL, TRIG, HDL, CHOLHDL, LDLCALC, LDLDIRECT  Wt Readings from Last 3 Encounters:  12/07/18 175 lb (79.4 kg)  04/19/18 179 lb 8 oz (81.4 kg)  08/02/17 185 lb 8 oz (84.1 kg)     Objective:    Vital Signs:  BP 105/68   Pulse (!) 112   Ht 5' 8.5" (1.74 m)   Wt 175 lb (79.4 kg)   BMI 26.22 kg/m    VITAL SIGNS:  reviewed  ASSESSMENT & PLAN:    1.  Hypoxia and hypotension: The exact etiology of this is not entirely clear given that she has no previous COPD.  She does have known history of pulmonary hypertension and we have to exclude worsening.  We also have to evaluate for possible right to left atrial shunting. Due to that, I recommend an echocardiogram to be done in 1 month with an office visit at that time.  We will have to check her sats at rest and with exertion.  2. Chronic atrial fibrillation: Currently being treated with rate control.    The dose of metoprolol was decreased due to hypotension.  I asked her to resume digoxin.  We can consider increasing the dose if needed for better rate control  3.  chronic diastolic heart failure:  She appears to be euvolemic on current dose of furosemide of 20 mg once daily.   4. Moderate rheumatic mitral stenosis: Most recent echo in January showed stable mild mitral stenosis with a mean gradient of 3 mmHg and valve area of 1.71.  Repeat echocardiogram  4.  Gradual weight loss: Unclear etiology.  She was told about new diagnosis of rheumatoid arthritis   COVID-19 Education: The signs and symptoms of COVID-19 were discussed with the  patient and how to seek care for testing (follow up with PCP or arrange E-visit).  The importance of social distancing was discussed today.  Time:   Today, I have spent 22 minutes with the patient with telehealth technology discussing the above problems.     Medication Adjustments/Labs and Tests Ordered: Current medicines are reviewed at length with the patient today.  Concerns regarding medicines are outlined above.   Tests Ordered: No orders of the defined types were placed in this encounter.   Medication Changes: No orders of the defined types were placed in this encounter.   Disposition:  Follow up in 1 month(s)  Signed, Kathlyn Sacramento, MD  12/07/2018 9:30 AM    [place]

## 2018-12-07 NOTE — Addendum Note (Signed)
Addended by: Ricci Barker on: 12/07/2018 09:45 AM   Modules accepted: Orders

## 2018-12-07 NOTE — Patient Instructions (Addendum)
Medication Instructions:  Continue same medications If you need a refill on your cardiac medications before your next appointment, please call your pharmacy.   Lab work: None If you have labs (blood work) drawn today and your tests are completely normal, you will receive your results only by: Marland Kitchen MyChart Message (if you have MyChart) OR . A paper copy in the mail If you have any lab test that is abnormal or we need to change your treatment, we will call you to review the results.  Testing/Procedures: Your physician has requested that you have an echocardiogram in one month. Echocardiography is a painless test that uses sound waves to create images of your heart. It provides your doctor with information about the size and shape of your heart and how well your heart's chambers and valves are working. You may receive an ultrasound enhancing agent through an IV if needed to better visualize your heart during the echo.This procedure takes approximately one hour. There are no restrictions for this procedure. This will take place at the [facility] clinic.    Follow-Up: With Dr. Fletcher Anon in 1 month after the echo.

## 2018-12-12 ENCOUNTER — Telehealth: Payer: Self-pay | Admitting: Cardiovascular Disease

## 2018-12-12 DIAGNOSIS — M2550 Pain in unspecified joint: Secondary | ICD-10-CM | POA: Diagnosis not present

## 2018-12-12 DIAGNOSIS — M25571 Pain in right ankle and joints of right foot: Secondary | ICD-10-CM | POA: Diagnosis not present

## 2018-12-12 DIAGNOSIS — M25562 Pain in left knee: Secondary | ICD-10-CM | POA: Diagnosis not present

## 2018-12-12 DIAGNOSIS — R5382 Chronic fatigue, unspecified: Secondary | ICD-10-CM | POA: Diagnosis not present

## 2018-12-12 DIAGNOSIS — M0609 Rheumatoid arthritis without rheumatoid factor, multiple sites: Secondary | ICD-10-CM | POA: Diagnosis not present

## 2018-12-12 NOTE — Telephone Encounter (Signed)
Spoke with patient. Had telemedicine visit with Dr Fletcher Anon on 5/1 where they discussed patient to take metoprolol two times a day instead of the 3 times a day as originally ordered.  Patient received AVS in the mail saying 3 times a day and wanted to clarify. Routing to Dr Fletcher Anon to clarify metoprolol tartrate 25 mg - whether 2 or 3 times a day. Then we will update chart.

## 2018-12-12 NOTE — Telephone Encounter (Signed)
Please call to discuss Metoprolol. Not sure if she should take 2 a day or 3 a day

## 2018-12-13 MED ORDER — METOPROLOL TARTRATE 25 MG PO TABS: 25.0000 mg | ORAL_TABLET | Freq: Two times a day (BID) | ORAL | Status: DC

## 2018-12-13 NOTE — Telephone Encounter (Signed)
The patient has been called and made aware and it has been changed in Epic.

## 2018-12-13 NOTE — Telephone Encounter (Signed)
Metoprolol is 25 mg twice daily.  Please update her medication list.  I probably forgot.

## 2019-01-18 ENCOUNTER — Other Ambulatory Visit: Payer: Medicare Other

## 2019-01-21 ENCOUNTER — Other Ambulatory Visit: Payer: Self-pay | Admitting: Cardiovascular Disease

## 2019-01-21 ENCOUNTER — Telehealth: Payer: Self-pay

## 2019-01-21 MED ORDER — DIGOXIN 125 MCG PO TABS: 125.0000 ug | ORAL_TABLET | Freq: Every day | ORAL | 0 refills | Status: DC

## 2019-01-21 MED ORDER — METOPROLOL TARTRATE 25 MG PO TABS: 25.0000 mg | ORAL_TABLET | Freq: Two times a day (BID) | ORAL | 0 refills | Status: DC

## 2019-01-21 MED ORDER — FUROSEMIDE 20 MG PO TABS: ORAL_TABLET | ORAL | 0 refills | Status: DC

## 2019-01-21 NOTE — Telephone Encounter (Signed)
Refill Request.  

## 2019-01-21 NOTE — Telephone Encounter (Signed)
Requested Prescriptions   Signed Prescriptions Disp Refills  . digoxin (DIGOX) 0.125 MG tablet 90 tablet 0    Sig: Take 1 tablet (125 mcg total) by mouth daily.    Authorizing Provider: Kathlyn Sacramento A    Ordering User: NEWCOMER MCCLAIN, BRANDY L  . metoprolol tartrate (LOPRESSOR) 25 MG tablet 180 tablet 0    Sig: Take 1 tablet (25 mg total) by mouth 2 (two) times daily.    Authorizing Provider: Kathlyn Sacramento A    Ordering User: NEWCOMER MCCLAIN, BRANDY L  . furosemide (LASIX) 20 MG tablet 90 tablet 0    Sig: TAKE 1 TABLET(20 MG) BY MOUTH DAILY    Authorizing Provider: Kathlyn Sacramento A    Ordering User: Raelene Bott, BRANDY L

## 2019-01-23 DIAGNOSIS — M25571 Pain in right ankle and joints of right foot: Secondary | ICD-10-CM | POA: Diagnosis not present

## 2019-01-23 DIAGNOSIS — D8989 Other specified disorders involving the immune mechanism, not elsewhere classified: Secondary | ICD-10-CM | POA: Diagnosis not present

## 2019-01-23 DIAGNOSIS — R5382 Chronic fatigue, unspecified: Secondary | ICD-10-CM | POA: Diagnosis not present

## 2019-01-23 DIAGNOSIS — E663 Overweight: Secondary | ICD-10-CM | POA: Diagnosis not present

## 2019-01-23 DIAGNOSIS — Z9229 Personal history of other drug therapy: Secondary | ICD-10-CM | POA: Diagnosis not present

## 2019-01-23 DIAGNOSIS — Z6827 Body mass index (BMI) 27.0-27.9, adult: Secondary | ICD-10-CM | POA: Diagnosis not present

## 2019-01-23 DIAGNOSIS — M0609 Rheumatoid arthritis without rheumatoid factor, multiple sites: Secondary | ICD-10-CM | POA: Diagnosis not present

## 2019-01-23 DIAGNOSIS — M25562 Pain in left knee: Secondary | ICD-10-CM | POA: Diagnosis not present

## 2019-01-23 DIAGNOSIS — M2550 Pain in unspecified joint: Secondary | ICD-10-CM | POA: Diagnosis not present

## 2019-01-24 ENCOUNTER — Telehealth: Payer: Medicare Other | Admitting: Cardiovascular Disease

## 2019-02-12 DIAGNOSIS — H4323 Crystalline deposits in vitreous body, bilateral: Secondary | ICD-10-CM | POA: Diagnosis not present

## 2019-02-12 DIAGNOSIS — H353231 Exudative age-related macular degeneration, bilateral, with active choroidal neovascularization: Secondary | ICD-10-CM | POA: Diagnosis not present

## 2019-02-15 ENCOUNTER — Other Ambulatory Visit (HOSPITAL_COMMUNITY): Payer: Self-pay | Admitting: Cardiovascular Disease

## 2019-02-15 DIAGNOSIS — I050 Rheumatic mitral stenosis: Secondary | ICD-10-CM

## 2019-02-21 ENCOUNTER — Telehealth: Payer: Self-pay | Admitting: Cardiovascular Disease

## 2019-02-21 NOTE — Telephone Encounter (Signed)

## 2019-02-22 ENCOUNTER — Other Ambulatory Visit: Payer: Self-pay

## 2019-02-22 ENCOUNTER — Ambulatory Visit (INDEPENDENT_AMBULATORY_CARE_PROVIDER_SITE_OTHER): Payer: Medicare Other

## 2019-02-22 DIAGNOSIS — I050 Rheumatic mitral stenosis: Secondary | ICD-10-CM | POA: Diagnosis not present

## 2019-02-22 MED ORDER — PERFLUTREN LIPID MICROSPHERE
1.0000 mL | INTRAVENOUS | Status: AC | PRN
  Administered 2019-02-22: 2 mL via INTRAVENOUS

## 2019-02-27 ENCOUNTER — Telehealth: Payer: Self-pay

## 2019-02-27 NOTE — Telephone Encounter (Signed)
Pt made aware of echo results and Dr. Tyrell Antonio recommendation. Pt sts that on the day of her echo one of Dr. Tyrell Antonio partners instructed her to increase Lasix to 40mg  daily. Pt sts that she has seen an improvement in her breathing since the increase. Adv the pt to continue and to keep her virtual appt with Dr. Fletcher Anon next week. Pt verbalized understanding.

## 2019-02-27 NOTE — Telephone Encounter (Signed)
-----   Message from Wellington Hampshire, MD sent at 02/27/2019  7:34 AM EDT ----- Inform patient that echo showed normal EF but pulmonary hypertension is worse. Increase lasix to 40 mg daily. Keep appointment next week

## 2019-03-05 ENCOUNTER — Encounter: Payer: Self-pay | Admitting: Cardiovascular Disease

## 2019-03-05 ENCOUNTER — Telehealth (INDEPENDENT_AMBULATORY_CARE_PROVIDER_SITE_OTHER): Payer: Medicare Other | Admitting: Cardiovascular Disease

## 2019-03-05 ENCOUNTER — Other Ambulatory Visit: Payer: Self-pay

## 2019-03-05 VITALS — BP 105/73 | HR 117 | Ht 68.5 in | Wt 180.0 lb

## 2019-03-05 DIAGNOSIS — I482 Chronic atrial fibrillation, unspecified: Secondary | ICD-10-CM | POA: Diagnosis not present

## 2019-03-05 DIAGNOSIS — I5033 Acute on chronic diastolic (congestive) heart failure: Secondary | ICD-10-CM

## 2019-03-05 DIAGNOSIS — I050 Rheumatic mitral stenosis: Secondary | ICD-10-CM

## 2019-03-05 MED ORDER — FUROSEMIDE 40 MG PO TABS: 40.0000 mg | ORAL_TABLET | Freq: Every day | ORAL | 2 refills | Status: DC

## 2019-03-05 MED ORDER — METOPROLOL TARTRATE 25 MG PO TABS: 25.0000 mg | ORAL_TABLET | Freq: Three times a day (TID) | ORAL | 3 refills | Status: DC

## 2019-03-05 NOTE — Progress Notes (Signed)
Virtual Visit via Telephone Note   This visit type was conducted due to national recommendations for restrictions regarding the COVID-19 Pandemic (e.g. social distancing) in an effort to limit this patient's exposure and mitigate transmission in our community.  Due to her co-morbid illnesses, this patient is at least at moderate risk for complications without adequate follow up.  This format is felt to be most appropriate for this patient at this time.  The patient did not have access to video technology/had technical difficulties with video requiring transitioning to audio format only (telephone).  All issues noted in this document were discussed and addressed.  No physical exam could be performed with this format.  Please refer to the patient's chart for her  consent to telehealth for [facility].   Date:  03/05/2019   ID:  Taylor Weber, DOB 1946/04/21, MRN [id number]  Patient Location: Home Provider Location: Office  PCP:  Rosalee Kaufman, PA-C  Cardiologist:  Kathlyn Sacramento, MD  Electrophysiologist:  None   Evaluation Performed:  Follow-Up Visit  Chief Complaint: Hypotension and hypoxia  History of Present Illness:    Taylor Weber is a 73 y.o. female who was reached via phone for follow-up visit regarding chronic atrial fibrillation . 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 She is on anticoagulation with Xarelto without any reported side effects. In the past when she was on warfarin she had significant difficulty in maintaining therapeutic INR.   No coronary artery disease on previous cardiac catheterization.  She was evaluated in May for symptoms that are suggestive of COVID-19 but her testing was negative.  She had problems with hypotension that necessitated decreasing metoprolol to 25 mg twice daily.  She had labs done which were overall unremarkable.  She had worsening  shortness of breath and developed hypoxia. She underwent an echocardiogram recently which showed normal LV systolic function, stable moderate mitral stenosis but progression of pulmonary hypertension with estimated systolic pressure between 75 to 80 mmHg with significantly dilated IVC.  Since that time, we increased furosemide to 40 mg once daily and reports significant improvement in symptoms.  She continues to have intermittent palpitations.  She continues to deal with rheumatoid arthritis and was started on methotrexate but she reports poor tolerance of this medication.   The patient does have symptoms concerning for COVID-19 infection (fever, chills, cough, or new shortness of breath).    Past Medical History:  Diagnosis Date  . Atherosclerotic cerebrovascular disease    nonobstructive  . Breast cancer ([place])    remote right breast mastectomy,bilaterial implants  . Breast cancer ([place])    radical mastectomy status post billaterial breast implants and right breast implat rupture  . Carotid arterial disease ([place])   . Carotid bruit    right   . Chest pain    normal coronary angiogram in december 2008  . Chronic diastolic congestive heart failure ([place])   . Diabetes mellitus    diet controlled  . Dyspnea    chronic exertional dyspnea related to intermittant atrial  . Mild aortic regurgitation with left ventricular dilation by prior echocardiogram   . Mitral stenosis    moderate  . Persistent atrial fibrillation    post cardioversion maintain normal sinus rhythm  . Pneumonia   . 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 Rheumatic fever    as a child  . Rheumatic mitral stenosis   .  Silicone leakage from breast implant   . Tricuspid regurgitation   . Vertigo    Past Surgical History:  Procedure Laterality Date  . APPENDECTOMY    . CARDIAC CATHETERIZATION  2008   no significant CAD  . CARDIAC CATHETERIZATION  04/2011   No significant CAD,  moderate mitral stenosis (mean gradient: 12 mm Hg, MVA: 1.83), moderate pulmonary hypertension (PVR: 2.4 Woods units), Normal LVEDP.   Marland Kitchen CARDIAC CATHETERIZATION N/A 04/15/2015   Procedure: Right/Left Heart Cath and Coronary Angiography;  Surgeon: Wellington Hampshire, MD;  Location: [place] CV LAB;  Service: Cardiovascular;  Laterality: N/A;  . CATARACT EXTRACTION Right   . CHOLECYSTECTOMY    . ELECTROPHYSIOLOGIC STUDY N/A 03/02/2015   Procedure: Cardioversion;  Surgeon: Wellington Hampshire, MD;  Location: [facility] ORS;  Service: Cardiovascular;  Laterality: N/A;  . MASTECTOMY Right   . PLACEMENT OF BREAST IMPLANTS     s/p mastectomy  . TEE WITHOUT CARDIOVERSION N/A 04/15/2015   Procedure: TRANSESOPHAGEAL ECHOCARDIOGRAM (TEE);  Surgeon: Dorothy Spark, MD;  Location: [facility] ENDOSCOPY;  Service: Cardiovascular;  Laterality: N/A;  . TOTAL ABDOMINAL HYSTERECTOMY       Current Meds  Medication Sig  . digoxin (DIGOX) 0.125 MG tablet Take 1 tablet (125 mcg total) by mouth daily.  . folic acid (FOLVITE) 1 MG tablet Take 1 mg by mouth daily.  . furosemide (LASIX) 20 MG tablet TAKE 1 TABLET(20 MG) BY MOUTH DAILY (Patient taking differently: Take 40 mg by mouth daily. TAKE 1 TABLET(40 MG) BY MOUTH DAILY)  . metoprolol tartrate (LOPRESSOR) 25 MG tablet Take 1 tablet (25 mg total) by mouth 2 (two) times daily.  . predniSONE (DELTASONE) 5 MG tablet as directed.  Alveda Reasons 20 MG TABS tablet TAKE 1 TABLET BY MOUTH EVERY DAY     Allergies:   Amoxicillin-pot clavulanate, Lidocaine, Other, Cefuroxime axetil, Nitrofuran derivatives, Pentazocine, Sulfa antibiotics, Methotrexate derivatives, Amiodarone, Clodronic acid, Darvon, Epinephrine, Levofloxacin, Pentazocine lactate, Procaine, Procaine hcl, Propoxyphene, and Septra [sulfamethoxazole w/trimethoprim (co-trimoxazole)]   Social History   Tobacco Use  . Smoking status: Former Smoker    Packs/day: 0.30    Years: 10.00    Pack years: 3.00    Types: Cigarettes     Quit date: 08/09/1983    Years since quitting: 35.5  . Smokeless tobacco: Never Used  Substance Use Topics  . Alcohol use: No  . Drug use: No     Family Hx: The patient's family history includes CAD in her sister.  ROS:   Please see the history of present illness.     All other systems reviewed and are negative.   Prior CV studies:   The following studies were reviewed today:    Labs/Other Tests and Data Reviewed:    EKG:  No ECG reviewed.  Recent Labs: 03/12/2018: BUN 13; Creatinine, Ser 0.75; Hemoglobin 12.9; Platelets 154; Potassium 4.4; Sodium 137   Recent Lipid Panel No results found for: CHOL, TRIG, HDL, CHOLHDL, LDLCALC, LDLDIRECT  Wt Readings from Last 3 Encounters:  03/05/19 180 lb (81.6 kg)  12/07/18 175 lb (79.4 kg)  04/19/18 179 lb 8 oz (81.4 kg)     Objective:    Vital Signs:  BP 105/73 (BP Location: Left Arm, Patient Position: Sitting, Cuff Size: Normal)   Pulse (!) 117   Ht 5' 8.5" (1.74 m)   Wt 180 lb (81.6 kg)   BMI 26.97 kg/m    VITAL SIGNS:  reviewed  ASSESSMENT & PLAN:    1.  Chronic atrial fibrillation: Currently being treated with rate control.    The dose of metoprolol was decreased due to hypotension few months ago but blood pressure has improved since then.  I elected to increase metoprolol back to 25 mg 3 times daily.  Continue current dose of digoxin but I might consider increasing the dose to 0.25 in the near future if heart rate control remains suboptimal. Continue anticoagulation with Xarelto.  3.    Acute on chronic diastolic heart failure with worsening severe pulmonary hypertension: We will increase the dose of furosemide to 40 mg once daily and since then she reports significant improvement in symptoms.  Continue same dose and check basic metabolic profile in 1 to 2 weeks which can be done at her primary care physician's office.  4. Moderate rheumatic mitral stenosis: I reviewed recent echocardiogram which showed stable moderate  mitral stenosis.    COVID-19 Education: The signs and symptoms of COVID-19 were discussed with the patient and how to seek care for testing (follow up with PCP or arrange E-visit).  The importance of social distancing was discussed today.  Time:   Today, I have spent 10 minutes with the patient with telehealth technology discussing the above problems.     Medication Adjustments/Labs and Tests Ordered: Current medicines are reviewed at length with the patient today.  Concerns regarding medicines are outlined above.   Tests Ordered: No orders of the defined types were placed in this encounter.   Medication Changes: No orders of the defined types were placed in this encounter.   Disposition:  Follow up virtual visit in 2 months.  Signed, Kathlyn Sacramento, MD  03/05/2019 4:37 PM    [place]

## 2019-03-05 NOTE — Patient Instructions (Signed)
Medication Instructions:  Increase metoprolol back to 25 mg 3 times daily. Continue with new dose of furosemide 40 mg once daily.  If you need a refill on your cardiac medications before your next appointment, please call your pharmacy.   Lab work: Check basic metabolic profile in 1 to 2 weeks. If you have labs (blood work) drawn today and your tests are completely normal, you will receive your results only by: Marland Kitchen MyChart Message (if you have MyChart) OR . A paper copy in the mail If you have any lab test that is abnormal or we need to change your treatment, we will call you to review the results.  Testing/Procedures: None  Follow-Up: At [facility], you and your health needs are our priority.  As part of our continuing mission to provide you with exceptional heart care, we have created designated Provider Care Teams.  These Care Teams include your primary Cardiologist (physician) and Advanced Practice Providers (APPs -  Physician Assistants and Nurse Practitioners) who all work together to provide you with the care you need, when you need it. You will need a follow up appointment in 2 months.  Please call our office 2 months in advance to schedule this appointment.  You may see Kathlyn Sacramento, MD or one of the following Advanced Practice Providers on your designated Care Team:   Murray Hodgkins, NP Christell Faith, PA-C . Marrianne Mood, PA-C

## 2019-03-06 ENCOUNTER — Telehealth: Payer: Self-pay

## 2019-03-06 ENCOUNTER — Encounter: Payer: Self-pay | Admitting: Cardiovascular Disease

## 2019-03-06 DIAGNOSIS — R5382 Chronic fatigue, unspecified: Secondary | ICD-10-CM | POA: Diagnosis not present

## 2019-03-06 DIAGNOSIS — M2550 Pain in unspecified joint: Secondary | ICD-10-CM | POA: Diagnosis not present

## 2019-03-06 DIAGNOSIS — M0609 Rheumatoid arthritis without rheumatoid factor, multiple sites: Secondary | ICD-10-CM | POA: Diagnosis not present

## 2019-03-06 DIAGNOSIS — Z6826 Body mass index (BMI) 26.0-26.9, adult: Secondary | ICD-10-CM | POA: Diagnosis not present

## 2019-03-06 DIAGNOSIS — M25571 Pain in right ankle and joints of right foot: Secondary | ICD-10-CM | POA: Diagnosis not present

## 2019-03-06 DIAGNOSIS — E663 Overweight: Secondary | ICD-10-CM | POA: Diagnosis not present

## 2019-03-06 DIAGNOSIS — M25562 Pain in left knee: Secondary | ICD-10-CM | POA: Diagnosis not present

## 2019-03-06 NOTE — Telephone Encounter (Signed)
Contacted the pt pcp office to inquire on the pt having a bmet there in 1-2 weeks as instructed by Dr. Fletcher Anon.  Adv that an order should be fax and they will contact the pt to schedule a lab appt at their office.  Order faxed via Epic with a rqst for the results to be faxed to our office attn: Dr. Fletcher Anon

## 2019-03-06 NOTE — Addendum Note (Signed)
Addended by: Lamar Laundry on: 03/06/2019 11:58 AM   Modules accepted: Orders

## 2019-03-14 ENCOUNTER — Telehealth: Payer: Self-pay

## 2019-03-14 NOTE — Telephone Encounter (Signed)
Error

## 2019-03-26 ENCOUNTER — Telehealth: Payer: Self-pay

## 2019-03-26 NOTE — Telephone Encounter (Signed)
Lmtcb. Patient was to have lab work recently at her pcp office with the results faxed to our office Dr.Arida's attn. Called the patient to see if the labwork was completed.

## 2019-03-27 NOTE — Telephone Encounter (Signed)
Late entry: spoke with the patient on 03/27/19. She did not have the labwork requested at her pcp. Patient sts that she was seen by a rheumatologist and a lot of lab work was ordered. Pt sts that she is pretty sure that the labwork that Dr.Arida was wanting should have been included. Pt sts that her rheumatologist was able to pull up info in care everywhere and that the lab Dr.Arida is requesting should be viewable there. Adv the pt that I would look in care everywhere for results, if I am unable to locate I will call her back. Pt sts that she can rqst a copy of the results from that physician if I am unable to locate the results.

## 2019-04-09 ENCOUNTER — Encounter

## 2019-04-09 NOTE — Telephone Encounter (Signed)
Spoke with the patient. Adv her that the labwork done with her Rheumatologist in July was not viewable in care everywhere. Patient provided the telephone to [facility] Dr. Leafy Kindle [phone]. Call and spoke with Otila Kluver. Pt had labs on 03/06/19. Otila Kluver will fax copies of the pts recent cmp and cbc to our office attn Dr. Fletcher Anon.

## 2019-04-10 ENCOUNTER — Telehealth: Payer: Self-pay

## 2019-04-10 NOTE — Telephone Encounter (Signed)
-----   Message from Wellington Hampshire, MD sent at 04/10/2019  1:11 PM EDT ----- Inform patient that labs were normal.  Continue same medications

## 2019-04-10 NOTE — Telephone Encounter (Signed)
Patient made aware of results with verbal understanding.

## 2019-05-01 ENCOUNTER — Other Ambulatory Visit: Payer: Self-pay | Admitting: Cardiovascular Disease

## 2019-05-02 DIAGNOSIS — R1013 Epigastric pain: Secondary | ICD-10-CM | POA: Diagnosis not present

## 2019-05-02 DIAGNOSIS — R197 Diarrhea, unspecified: Secondary | ICD-10-CM | POA: Diagnosis not present

## 2019-05-02 DIAGNOSIS — R5383 Other fatigue: Secondary | ICD-10-CM | POA: Diagnosis not present

## 2019-05-07 ENCOUNTER — Other Ambulatory Visit: Payer: Self-pay

## 2019-05-07 ENCOUNTER — Ambulatory Visit (INDEPENDENT_AMBULATORY_CARE_PROVIDER_SITE_OTHER): Payer: Medicare Other | Admitting: Cardiovascular Disease

## 2019-05-07 ENCOUNTER — Encounter: Payer: Self-pay | Admitting: Cardiovascular Disease

## 2019-05-07 VITALS — BP 126/64 | HR 94 | Ht 68.0 in | Wt 171.0 lb

## 2019-05-07 DIAGNOSIS — I050 Rheumatic mitral stenosis: Secondary | ICD-10-CM

## 2019-05-07 DIAGNOSIS — I5032 Chronic diastolic (congestive) heart failure: Secondary | ICD-10-CM | POA: Diagnosis not present

## 2019-05-07 DIAGNOSIS — I4821 Permanent atrial fibrillation: Secondary | ICD-10-CM

## 2019-05-07 NOTE — Patient Instructions (Signed)
Medication Instructions:  Your physician recommends that you continue on your current medications as directed. Please refer to the Current Medication list given to you today.  If you need a refill on your cardiac medications before your next appointment, please call your pharmacy.   Lab work: None ordered If you have labs (blood work) drawn today and your tests are completely normal, you will receive your results only by: . MyChart Message (if you have MyChart) OR . A paper copy in the mail If you have any lab test that is abnormal or we need to change your treatment, we will call you to review the results.  Testing/Procedures: None ordered  Follow-Up: At [facility], you and your health needs are our priority.  As part of our continuing mission to provide you with exceptional heart care, we have created designated Provider Care Teams.  These Care Teams include your primary Cardiologist (physician) and Advanced Practice Providers (APPs -  Physician Assistants and Nurse Practitioners) who all work together to provide you with the care you need, when you need it. You will need a follow up appointment in 6 months.  Please call our office 2 months in advance to schedule this appointment.  You may see Muhammad Arida, MD or one of the following Advanced Practice Providers on your designated Care Team:   Christopher Berge, NP Ryan Dunn, PA-C . Jacquelyn Visser, PA-C  Any Other Special Instructions Will Be Listed Below (If Applicable). N/A   

## 2019-05-07 NOTE — Progress Notes (Signed)
Cardiology Office Note   Date:  05/07/2019   ID:  Taylor, Weber [birth year], MRN [id number]  PCP:  Rosalee Kaufman, PA-C  Cardiologist:   Kathlyn Sacramento, MD   Chief Complaint  Patient presents with  . other    2 mo f/u. Medications reviewed verbally.       History of Present Illness: Taylor Weber is a 73 y.o. 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  She is on anticoagulation with Xarelto without any reported side effects. In the past when she was on warfarin she had significant difficulty in maintaining therapeutic INR.   No coronary artery disease on previous cardiac catheterization.  She was evaluated in May for symptoms that are suggestive of COVID-19 but her testing was negative.  She had problems with hypotension that necessitated decreasing metoprolol to 25 mg twice daily.  She had labs done which were overall unremarkable.  She had worsening shortness of breath and developed hypoxia. She underwent an echocardiogram in July which showed normal LV systolic function, stable moderate mitral stenosis but progression of pulmonary hypertension with estimated systolic pressure between 75 to 80 mmHg with significantly dilated IVC.  Since that time, we increased furosemide to 40 mg once daily . She continues to deal with rheumatoid arthritis with poor tolerance of medications.  She did not tolerate methotrexate very well most recently was started on [place] but does not feel well on this medication either. Her weight went down to 171 from 180 pounds.  Leg edema resolved.  Shortness of breath improved with diuresis but did not resolve.    Past Medical History:  Diagnosis Date  . Atherosclerotic cerebrovascular disease    nonobstructive  . Breast cancer ([place])    remote right breast mastectomy,bilaterial implants  . Breast cancer ([place])    radical mastectomy status post billaterial breast implants and right breast implat rupture  . Carotid arterial disease ([place])   . Carotid bruit    right   . Chest pain    normal coronary angiogram in december 2008  . Chronic diastolic congestive heart failure ([place])   . Diabetes mellitus    diet controlled  . Dyspnea    chronic exertional dyspnea related to intermittant atrial  . Mild aortic regurgitation with left ventricular dilation by prior echocardiogram   . Mitral stenosis    moderate  . Persistent atrial fibrillation    post cardioversion maintain normal sinus rhythm  . Pneumonia   . 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 Rheumatic fever    as a child  . Rheumatic mitral stenosis   . Silicone leakage from breast implant   . Tricuspid regurgitation   . Vertigo     Past Surgical History:  Procedure Laterality Date  . APPENDECTOMY    . CARDIAC CATHETERIZATION  2008   no significant CAD  . CARDIAC CATHETERIZATION  04/2011   No significant CAD, moderate mitral stenosis (mean gradient: 12 mm Hg, MVA: 1.83), moderate pulmonary hypertension (PVR: 2.4 [place] units), Normal LVEDP.   Marland Kitchen CARDIAC CATHETERIZATION N/A 04/15/2015   Procedure: Right/Left Heart Cath and Coronary Angiography;  Surgeon: Wellington Hampshire, MD;  Location: [facility];  Service: Cardiovascular;  Laterality: N/A;  . CATARACT EXTRACTION Right   . CHOLECYSTECTOMY    . ELECTROPHYSIOLOGIC STUDY N/A 03/02/2015   Procedure: Cardioversion;  Surgeon: Wellington Hampshire, MD;  Location: [facility];  Service: Cardiovascular;  Laterality: N/A;  . MASTECTOMY Right   . PLACEMENT OF BREAST IMPLANTS     s/p mastectomy  . TEE WITHOUT CARDIOVERSION N/A 04/15/2015   Procedure: TRANSESOPHAGEAL ECHOCARDIOGRAM (TEE);  Surgeon: Dorothy Spark, MD;  Location: [facility];  Service: Cardiovascular;  Laterality: N/A;  . TOTAL ABDOMINAL HYSTERECTOMY       Current Outpatient Medications  Medication  Sig Dispense Refill  . digoxin (LANOXIN) 0.125 MG tablet TAKE 1 TABLET BY MOUTH EVERY DAY 90 tablet 0  . folic acid (FOLVITE) 1 MG tablet Take 1 mg by mouth daily.    . furosemide (LASIX) 40 MG tablet Take 1 tablet (40 mg total) by mouth daily. 90 tablet 2  . leflunomide (ARAVA) 20 MG tablet     . metoprolol tartrate (LOPRESSOR) 25 MG tablet Take 1 tablet (25 mg total) by mouth 3 (three) times daily. 180 tablet 3  . XARELTO 20 MG TABS tablet TAKE 1 TABLET BY MOUTH EVERY DAY 90 tablet 3   No current facility-administered medications for this visit.     Allergies:   Amoxicillin-pot clavulanate, Lidocaine, Other, Cefuroxime axetil, Nitrofuran derivatives, Pentazocine, Sulfa antibiotics, Methotrexate derivatives, Amiodarone, Clodronic acid, Darvon, Epinephrine, Levofloxacin, Pentazocine lactate, Procaine, Procaine hcl, Propoxyphene, and Septra [sulfamethoxazole w/trimethoprim (co-trimoxazole)]    Social History:  The patient  reports that she quit smoking about 35 years ago. Her smoking use included cigarettes. She has a 3.00 pack-year smoking history. She has never used smokeless tobacco. She reports that she does not drink alcohol or use drugs.   Family History:  The patient's family history includes CAD in her sister.    ROS:  Please see the history of present illness.   Otherwise, review of systems are positive for none.   All other systems are reviewed and negative.    PHYSICAL EXAM: VS:  BP 126/64 (BP Location: Left Arm, Patient Position: Sitting, Cuff Size: Normal)   Pulse 94   Ht 5\' 8"  (1.727 m)   Wt 171 lb (77.6 kg)   BMI 26.00 kg/m  , BMI Body mass index is 26 kg/m. GEN: Well nourished, well developed, in no acute distress  HEENT: normal  Neck: no  carotid bruits, or masses. No JVD Cardiac: Irregularly irregular; no rubs, or gallops. There is 1/6 holosystolic murmur at the left sternal border. She no edema  Respiratory:  clear to auscultation bilaterally, normal work of  breathing GI: soft, nontender, nondistended, + BS MS: no deformity or atrophy  Skin: warm and dry, no rash Neuro:  Strength and sensation are intact Psych: euthymic mood, full affect   EKG:  EKG is ordered today. The ekg ordered today demonstrates atrial fibrillation with ventricular rate of 98 bpm.   Recent Labs: No results found for requested labs within last 8760 hours.    Lipid Panel No results found for: CHOL, TRIG, HDL, CHOLHDL, VLDL, LDLCALC, LDLDIRECT    Wt Readings from Last 3 Encounters:  05/07/19 171 lb (77.6 kg)  03/05/19 180 lb (81.6 kg)  12/07/18 175 lb (79.4 kg)       ASSESSMENT AND PLAN:  1. Chronic atrial fibrillation: Currently being treated with rate control.    Continue metoprolol 25 mg 3 times daily and low-dose digoxin.  We can consider increasing the dose of digoxin to 0.25 if additional rate control is needed. Continue anticoagulation with Xarelto.  2.    Chronic diastolic heart failure with severe pulmonary  hypertension: This has improved significantly after increasing furosemide to 40 mg once daily.  She appears to be euvolemic at the present time.    3. Moderate rheumatic mitral stenosis: I reviewed recent echocardiogram which showed stable moderate mitral stenosis.   4.  Exertional dyspnea: I suspect that this is likely multifactorial due to all of the above.   Disposition:   FU with me in 6 months  Signed,  Kathlyn Sacramento, MD  05/07/2019 2:21 PM    [place]

## 2019-05-09 DIAGNOSIS — R109 Unspecified abdominal pain: Secondary | ICD-10-CM | POA: Diagnosis not present

## 2019-05-09 DIAGNOSIS — M2550 Pain in unspecified joint: Secondary | ICD-10-CM | POA: Diagnosis not present

## 2019-05-09 DIAGNOSIS — M0609 Rheumatoid arthritis without rheumatoid factor, multiple sites: Secondary | ICD-10-CM | POA: Diagnosis not present

## 2019-05-09 DIAGNOSIS — E663 Overweight: Secondary | ICD-10-CM | POA: Diagnosis not present

## 2019-05-09 DIAGNOSIS — R1900 Intra-abdominal and pelvic swelling, mass and lump, unspecified site: Secondary | ICD-10-CM | POA: Diagnosis not present

## 2019-05-09 DIAGNOSIS — Z6825 Body mass index (BMI) 25.0-25.9, adult: Secondary | ICD-10-CM | POA: Diagnosis not present

## 2019-05-09 DIAGNOSIS — R1013 Epigastric pain: Secondary | ICD-10-CM | POA: Diagnosis not present

## 2019-05-16 DIAGNOSIS — H353231 Exudative age-related macular degeneration, bilateral, with active choroidal neovascularization: Secondary | ICD-10-CM | POA: Diagnosis not present

## 2019-05-16 DIAGNOSIS — H40052 Ocular hypertension, left eye: Secondary | ICD-10-CM | POA: Diagnosis not present

## 2019-05-21 DIAGNOSIS — H40032 Anatomical narrow angle, left eye: Secondary | ICD-10-CM | POA: Diagnosis not present

## 2019-05-28 DIAGNOSIS — H40032 Anatomical narrow angle, left eye: Secondary | ICD-10-CM | POA: Diagnosis not present

## 2019-06-10 DIAGNOSIS — H353231 Exudative age-related macular degeneration, bilateral, with active choroidal neovascularization: Secondary | ICD-10-CM | POA: Diagnosis not present

## 2019-06-17 IMAGING — MR MR ABDOMEN WO/W CM
16 of 18 series · 42 of 48 positions shown · IV contrast (multihance)
Comparison: CT scan 01/13/2017

CLINICAL DATA: Pancreatic lesion on recent CT scan.

EXAM:
MRI ABDOMEN WITHOUT AND WITH CONTRAST
TECHNIQUE: Multiplanar multisequence MR imaging of the abdomen was performed
both before and after the administration of intravenous contrast.
CONTRAST:  18mL MULTIHANCE GADOBENATE DIMEGLUMINE 529 MG/ML IV SOLN

[Series 3: T2 · coronal · 5.0mm · 1.56mm/px · 2 of 36 slices shown (1 of 3)]
[im 1/36]
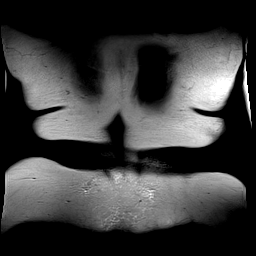
[im 36/36]
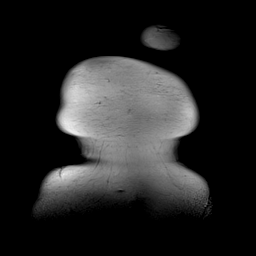

[Series 4: T1 · axial · 3.5mm · 1.19mm/px · z∈[-113,+135]mm · 5 of 144 slices shown]
[im 1/144]
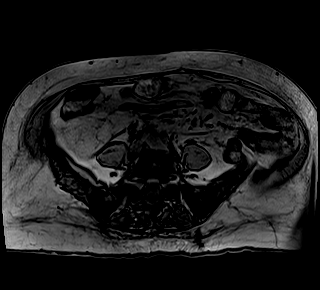
[im 36/144]
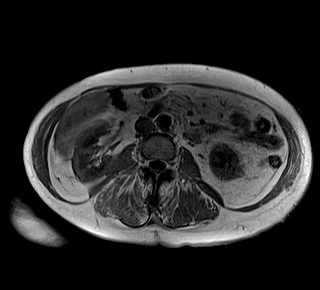
[im 72/144]
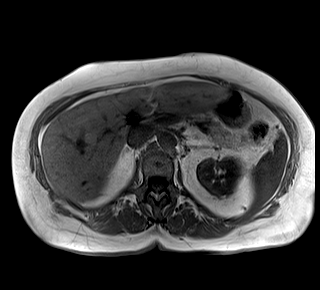
[im 108/144]
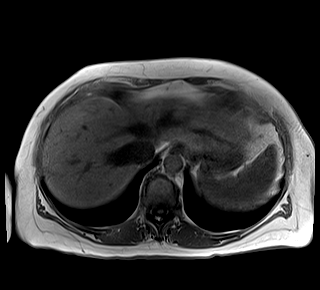
[im 144/144]
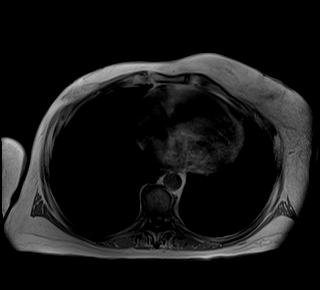

[Series 6: DWI · axial · 5.0mm · 1.42mm/px · z∈[-81,+165]mm · 4 of 126 slices shown (1 of 2)]
[im 1/126]
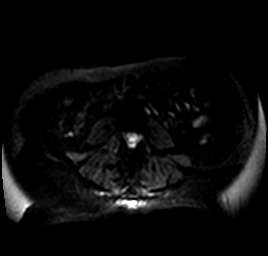
[im 42/126]
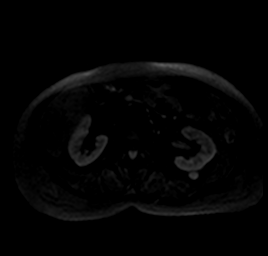
[im 84/126]
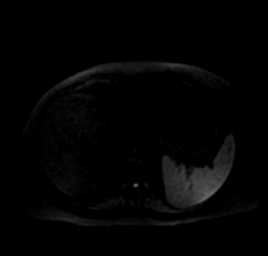
[im 126/126]
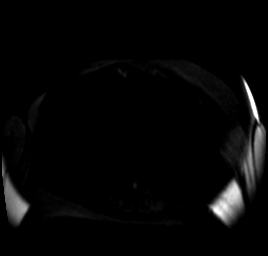

[Series 7: DWI · axial · 5.0mm · 1.42mm/px · 1 of 42 slices shown (2 of 2)]
[im 1/42]
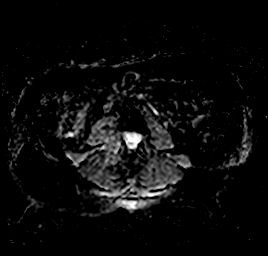

[Series 10: T2 · axial · 6.0mm · 1.22mm/px · 1 of 36 slices shown (2 of 3)]
[im 1/36]
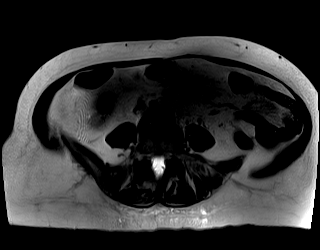

[Series 12: MRCP · coronal · 1.0mm · 0.49mm/px · 2 of 64 slices shown]
[im 1/64]
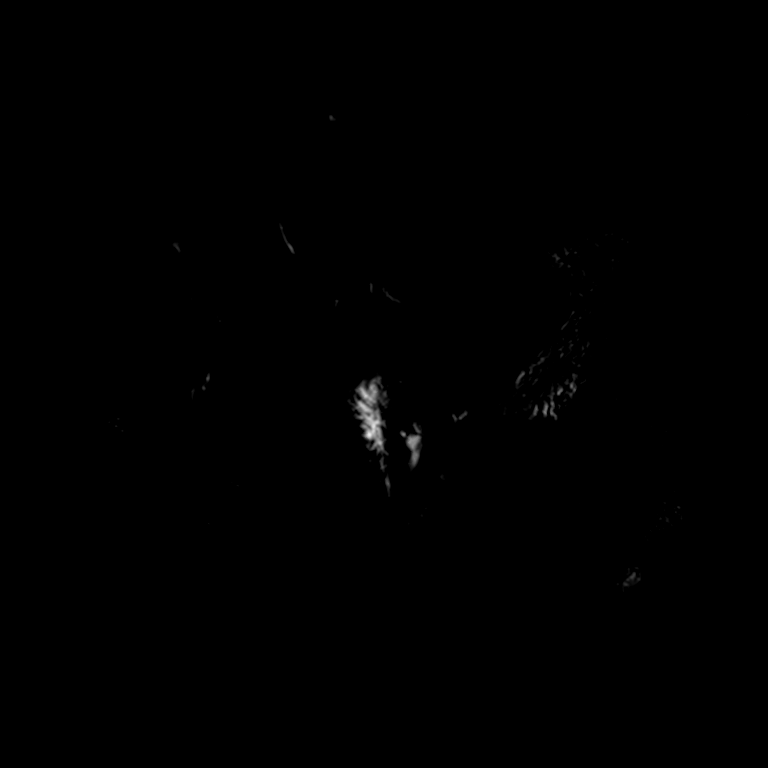
[im 64/64]
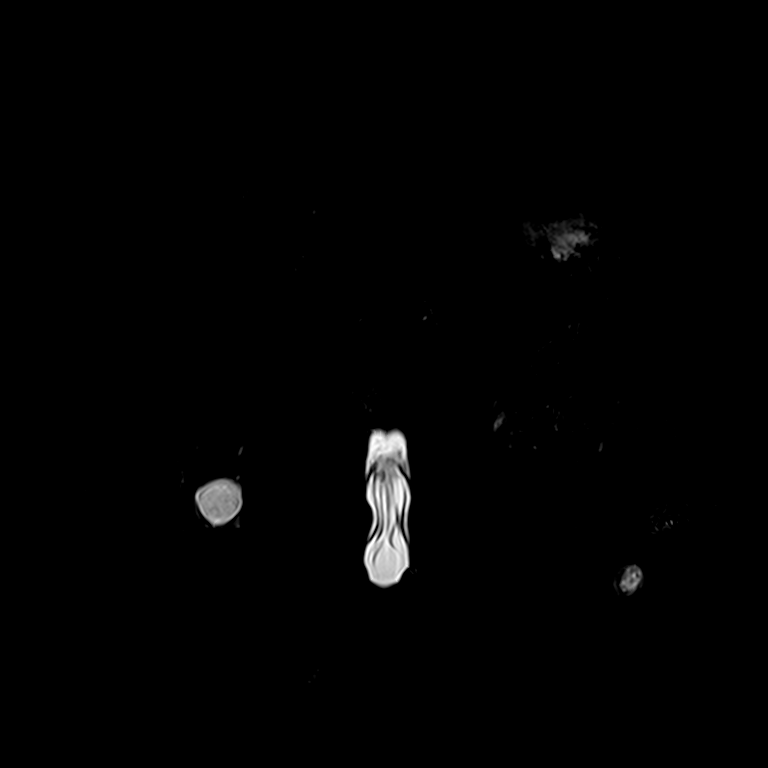

[Series 14: T2 · coronal · 3.0mm · 1.19mm/px · 1 of 19 slices shown (3 of 3)]
[im 1/19]
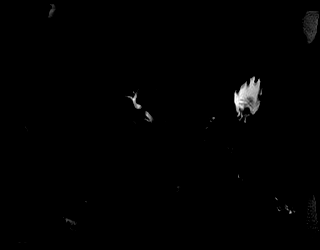

[Series 16: bSSFP · axial · 5.0mm · 1.25mm/px · z∈[-108,+150]mm · 2 of 44 slices shown]
[im 1/44]
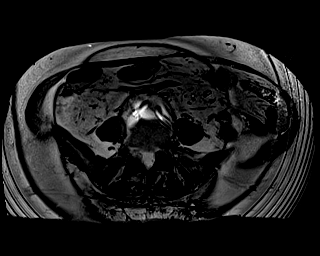
[im 44/44]
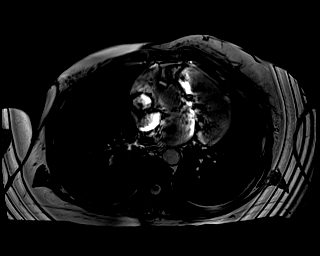

[Series 17: T1 dynamic · axial · non-contrast · 3.0mm · 1.25mm/px · z∈[-117,+144]mm · 3 of 88 slices shown]
[im 1/88]
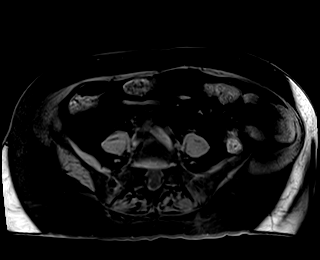
[im 44/88]
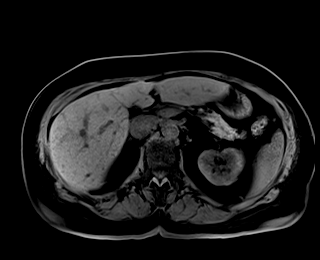
[im 88/88]
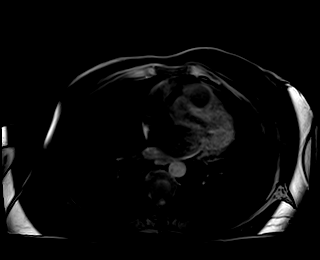

[Series 18: T1 dynamic post-contrast · axial · 3.0mm · 1.25mm/px · z∈[-117,+144]mm · 3 of 88 slices shown (1 of 7)]
[im 1/88]
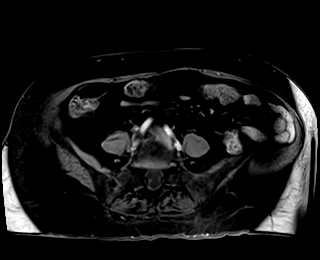
[im 44/88]
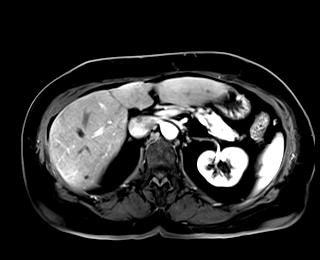
[im 88/88]
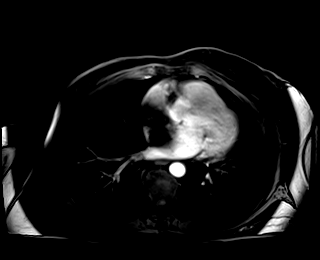

[Series 19: T1 dynamic post-contrast · axial · 3.0mm · 1.25mm/px · z∈[-117,+144]mm · 3 of 88 slices shown (2 of 7)]
[im 1/88]
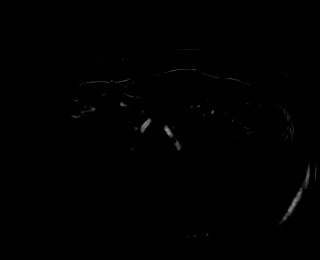
[im 44/88]
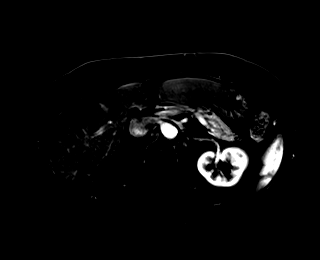
[im 88/88]
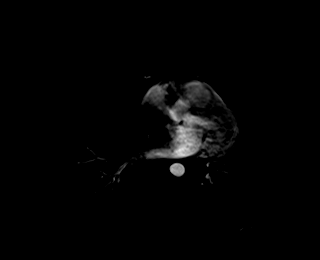

[Series 20: T1 dynamic post-contrast · axial · 3.0mm · 1.25mm/px · z∈[-117,+144]mm · 3 of 88 slices shown (3 of 7)]
[im 1/88]
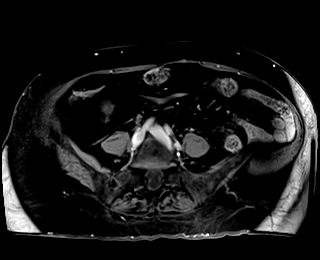
[im 44/88]
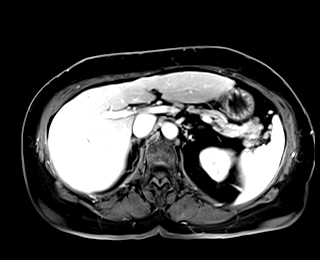
[im 88/88]
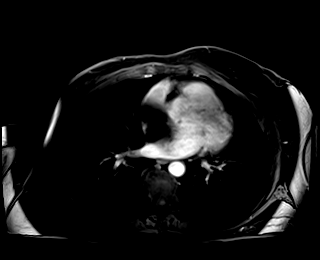

[Series 21: T1 dynamic post-contrast · axial · 3.0mm · 1.25mm/px · z∈[-117,+144]mm · 3 of 88 slices shown (4 of 7)]
[im 1/88]
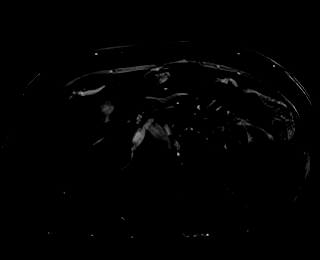
[im 44/88]
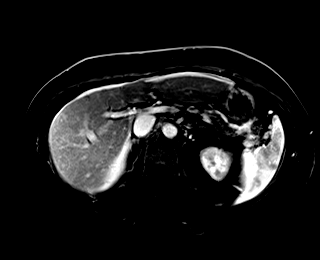
[im 88/88]
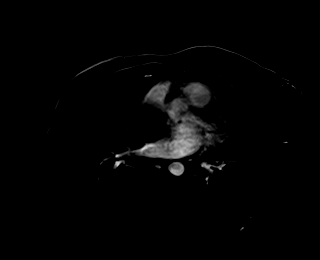

[Series 22: T1 dynamic post-contrast · axial · 3.0mm · 1.25mm/px · z∈[-117,+144]mm · 3 of 88 slices shown (5 of 7)]
[im 1/88]
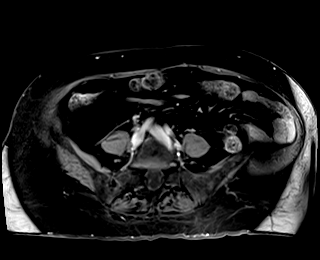
[im 44/88]
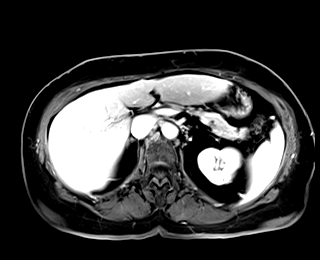
[im 88/88]
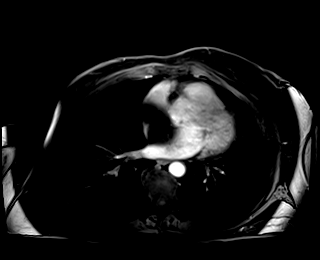

[Series 23: T1 dynamic post-contrast · axial · 3.0mm · 1.25mm/px · z∈[-117,+144]mm · 3 of 88 slices shown (6 of 7)]
[im 1/88]
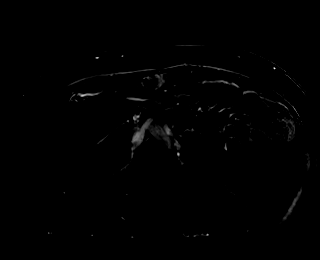
[im 44/88]
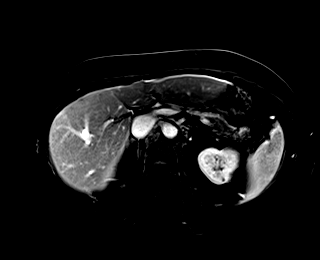
[im 88/88]
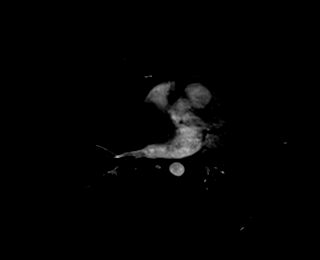

[Series 24: T1 dynamic post-contrast · coronal · 3.0mm · 1.25mm/px · 3 of 72 slices shown (7 of 7)]
[im 1/72]
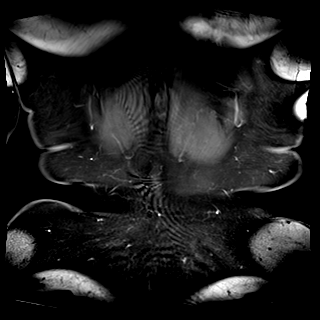
[im 36/72]
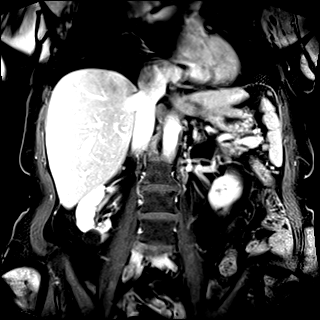
[im 72/72]
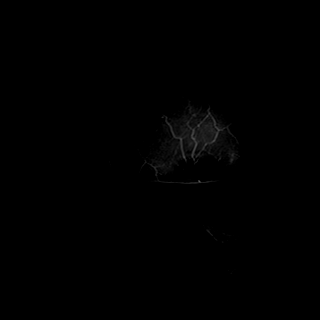

[42 of 48 positions shown; findings below may reference images not displayed]

FINDINGS: Lower chest:  Unremarkable.

Hepatobiliary: No focal abnormality within the liver parenchyma.
Gallbladder surgically absent. No intrahepatic or extrahepatic
biliary dilation.

Pancreas: No dilatation of the main pancreatic duct. 7 x 16 mm T1
hypointense, T2 hyperintense lesion is identified in the posterior
aspect of the uncinate process of the pancreas. No definite
communication to the main pancreatic duct evident. After IV contrast
administration, no discernible mural or nodular enhancement.

Spleen: No splenomegaly. No focal mass lesion.

Adrenals/Urinary Tract: No adrenal nodule or mass. Multiple
bilateral simple cysts of varying sizes are seen in the kidneys
bilaterally.

Stomach/Bowel: Stomach is nondistended. No gastric wall thickening.
No evidence of outlet obstruction. Duodenum is normally positioned
as is the ligament of Treitz. Visualize small bowel loops and
colonic segments of the abdomen are nondilated.

Vascular/Lymphatic: No abdominal aortic aneurysm. There is no
gastrohepatic or hepatoduodenal ligament lymphadenopathy. No
intraperitoneal or retroperitoneal lymphadenopathy.

Other: No intraperitoneal free fluid.

Musculoskeletal: No abnormal marrow enhancement within the
visualized bony anatomy.
IMPRESSION: 1. 7 x 16 mm cystic lesion posterior uncinate process without
suspicious or concerning enhancement on today's study. This is
likely a benign process. Repeat MRI in 12 months is recommended to
ensure stability. This recommendation follows ACR consensus
guidelines: Management of Incidental Pancreatic Cysts: A White Paper
of the ACR Incidental Findings Committee. [HOSPITAL]
2. Bilateral renal cysts.

## 2019-06-19 DIAGNOSIS — H40032 Anatomical narrow angle, left eye: Secondary | ICD-10-CM | POA: Diagnosis not present

## 2019-06-19 DIAGNOSIS — H1132 Conjunctival hemorrhage, left eye: Secondary | ICD-10-CM | POA: Diagnosis not present

## 2019-07-03 ENCOUNTER — Other Ambulatory Visit: Payer: Self-pay

## 2019-07-15 ENCOUNTER — Other Ambulatory Visit: Payer: Self-pay | Admitting: *Deleted

## 2019-07-15 MED ORDER — METOPROLOL TARTRATE 25 MG PO TABS: 25.0000 mg | ORAL_TABLET | Freq: Three times a day (TID) | ORAL | 0 refills | Status: DC

## 2019-07-17 DIAGNOSIS — H40032 Anatomical narrow angle, left eye: Secondary | ICD-10-CM | POA: Diagnosis not present

## 2019-07-28 ENCOUNTER — Other Ambulatory Visit: Payer: Self-pay | Admitting: Cardiovascular Disease

## 2019-08-15 DIAGNOSIS — R0600 Dyspnea, unspecified: Secondary | ICD-10-CM | POA: Diagnosis not present

## 2019-08-15 DIAGNOSIS — Z853 Personal history of malignant neoplasm of breast: Secondary | ICD-10-CM | POA: Diagnosis not present

## 2019-08-15 DIAGNOSIS — I509 Heart failure, unspecified: Secondary | ICD-10-CM | POA: Diagnosis not present

## 2019-08-15 DIAGNOSIS — I272 Pulmonary hypertension, unspecified: Secondary | ICD-10-CM | POA: Diagnosis not present

## 2019-08-27 DIAGNOSIS — H4323 Crystalline deposits in vitreous body, bilateral: Secondary | ICD-10-CM | POA: Diagnosis not present

## 2019-08-27 DIAGNOSIS — H353231 Exudative age-related macular degeneration, bilateral, with active choroidal neovascularization: Secondary | ICD-10-CM | POA: Diagnosis not present

## 2019-10-07 DIAGNOSIS — Z853 Personal history of malignant neoplasm of breast: Secondary | ICD-10-CM | POA: Diagnosis not present

## 2019-10-07 DIAGNOSIS — Z789 Other specified health status: Secondary | ICD-10-CM | POA: Diagnosis not present

## 2019-10-07 DIAGNOSIS — I272 Pulmonary hypertension, unspecified: Secondary | ICD-10-CM | POA: Diagnosis not present

## 2019-10-07 DIAGNOSIS — I509 Heart failure, unspecified: Secondary | ICD-10-CM | POA: Diagnosis not present

## 2019-10-07 DIAGNOSIS — R0600 Dyspnea, unspecified: Secondary | ICD-10-CM | POA: Diagnosis not present

## 2019-10-09 ENCOUNTER — Telehealth: Payer: Self-pay | Admitting: Cardiovascular Disease

## 2019-10-09 NOTE — Telephone Encounter (Signed)
Patient states she uses oxygen at night and would like advice on the vaccination for COVID. Please call to discuss.

## 2019-10-09 NOTE — Telephone Encounter (Signed)
Spoke with the patient. She is scheduled for her first dose of her COVID vaccine tomorrow. Patient wanted Dr. Tyrell Antonio thoughts regarding the vaccine. Adv the patient that Dr. Fletcher Anon has been encouraging his patients to get vaccinated and that he has taken the vaccine as well. Patient sts that is all she needed to know and will proceed with her COVID vaccine as planned. Patient voiced appreciation for the call back.

## 2019-10-10 DIAGNOSIS — Z23 Encounter for immunization: Secondary | ICD-10-CM | POA: Diagnosis not present

## 2019-10-28 ENCOUNTER — Other Ambulatory Visit: Payer: Self-pay | Admitting: Cardiovascular Disease

## 2019-10-28 ENCOUNTER — Other Ambulatory Visit: Payer: Self-pay

## 2019-10-28 DIAGNOSIS — I5032 Chronic diastolic (congestive) heart failure: Secondary | ICD-10-CM

## 2019-10-28 NOTE — Telephone Encounter (Signed)
Pharmacy is requesting Digixin to be refilled.  Patients last Digoxin labs were in 2019.  It looks like there is an order for 2020 but it was not completed.   Does the patient need labs before refilling?

## 2019-10-28 NOTE — Telephone Encounter (Signed)
Called patient.  Made her aware we would need lab work before refilling her medicine.   She stated :"I live in [place] now, Can I get it done the day of my appointment (11/21/2019)?"  I made her aware that we could put it in for the medical mall and for her to arrive to her appointment with Dr. Fletcher Anon 11/21/2019 early so she can have that done.   She stated that would be better for her to do but she would not have enough medication to last until them.

## 2019-10-28 NOTE — Telephone Encounter (Signed)
Please schedule F/U appointment with Dr. Fletcher Anon. Thank you!

## 2019-10-28 NOTE — Telephone Encounter (Signed)
Patient called in to state she has enough medication to last her till her appointment on 4/15

## 2019-10-28 NOTE — Telephone Encounter (Signed)
Scheduled 4/15

## 2019-11-07 DIAGNOSIS — Z23 Encounter for immunization: Secondary | ICD-10-CM | POA: Diagnosis not present

## 2019-11-21 ENCOUNTER — Ambulatory Visit (INDEPENDENT_AMBULATORY_CARE_PROVIDER_SITE_OTHER): Payer: Medicare Other | Admitting: Cardiovascular Disease

## 2019-11-21 ENCOUNTER — Other Ambulatory Visit: Payer: Self-pay

## 2019-11-21 ENCOUNTER — Encounter: Payer: Self-pay | Admitting: Cardiovascular Disease

## 2019-11-21 VITALS — BP 118/68 | HR 82 | Ht 68.0 in | Wt 161.0 lb

## 2019-11-21 DIAGNOSIS — I4821 Permanent atrial fibrillation: Secondary | ICD-10-CM

## 2019-11-21 DIAGNOSIS — I050 Rheumatic mitral stenosis: Secondary | ICD-10-CM

## 2019-11-21 DIAGNOSIS — I5032 Chronic diastolic (congestive) heart failure: Secondary | ICD-10-CM | POA: Diagnosis not present

## 2019-11-21 NOTE — Patient Instructions (Signed)
Medication Instructions:  Your physician recommends that you continue on your current medications as directed. Please refer to the Current Medication list given to you today.  *If you need a refill on your cardiac medications before your next appointment, please call your pharmacy*   Lab Work: CMP, Cbc, Tsh, Dig level today If you have labs (blood work) drawn today and your tests are completely normal, you will receive your results only by: Marland Kitchen MyChart Message (if you have MyChart) OR . A paper copy in the mail If you have any lab test that is abnormal or we need to change your treatment, we will call you to review the results.   Testing/Procedures: None ordered   Follow-Up: At [facility], you and your health needs are our priority.  As part of our continuing mission to provide you with exceptional heart care, we have created designated Provider Care Teams.  These Care Teams include your primary Cardiologist (physician) and Advanced Practice Providers (APPs -  Physician Assistants and Nurse Practitioners) who all work together to provide you with the care you need, when you need it.  We recommend signing up for the patient portal called "MyChart".  Sign up information is provided on this After Visit Summary.  MyChart is used to connect with patients for Virtual Visits (Telemedicine).  Patients are able to view lab/test results, encounter notes, upcoming appointments, etc.  Non-urgent messages can be sent to your provider as well.   To learn more about what you can do with MyChart, go to [email or web address].    Your next appointment:   6 month(s)  The format for your next appointment:   In Person  Provider:    You may see Kathlyn Sacramento, MD or one of the following Advanced Practice Providers on your designated Care Team:    Murray Hodgkins, NP  Christell Faith, PA-C  Marrianne Mood, PA-C    Other Instructions N/A

## 2019-11-21 NOTE — Progress Notes (Signed)
Cardiology Office Note   Date:  11/21/2019   ID:  Taylor Weber, DOB November 02, 1945, MRN [id number]  PCP:  Rosalee Kaufman, PA-C  Cardiologist:   Kathlyn Sacramento, MD   Chief Complaint  Patient presents with  . Other    6 mth f/u      History of Present Illness: Taylor Weber is a 74 y.o. 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  She is on anticoagulation with Xarelto without any reported side effects. In the past when she was on warfarin she had significant difficulty in maintaining therapeutic INR.   No coronary artery disease on previous cardiac catheterization. Most recent echocardiogram in July 2020 showed normal LV systolic function, stable moderate mitral stenosis but progression of pulmonary hypertension with estimated systolic pressure between 75 to 80 mmHg with significantly dilated IVC.  Since that time, we increased furosemide to 40 mg once daily . She has known history of rheumatoid arthritis with poor tolerance to medications.  She is currently not on anything.   She had initial weight loss with diuresis thinking that she was volume overloaded.  However, she continued to lose weight and she is down 20 pounds from last year.  She reports very poor appetite.  In addition, she has been struggling with grief and seems to be depression after the death of her close friend in 09/28/22 from possible COVID-19.  The patient did receive COVID-19 vaccination without issues. In addition, she reports being seen by pulmonary in [place] for exertional hypoxia.  Results of testing is not available to me.   Past Medical History:  Diagnosis Date  . Atherosclerotic cerebrovascular disease    nonobstructive  . Breast cancer ([place])    remote right breast mastectomy,bilaterial implants  . Breast cancer ([place])    radical mastectomy status post billaterial  breast implants and right breast implat rupture  . Carotid arterial disease ([place])   . Carotid bruit    right   . Chest pain    normal coronary angiogram in december 2008  . Chronic diastolic congestive heart failure ([place])   . Diabetes mellitus    diet controlled  . Dyspnea    chronic exertional dyspnea related to intermittant atrial  . Mild aortic regurgitation with left ventricular dilation by prior echocardiogram   . Mitral stenosis    moderate  . Persistent atrial fibrillation ([place])    post cardioversion maintain normal sinus rhythm  . Pneumonia   . 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 Rheumatic fever    as a child  . Rheumatic mitral stenosis   . Silicone leakage from breast implant   . Tricuspid regurgitation   . Vertigo     Past Surgical History:  Procedure Laterality Date  . APPENDECTOMY    . CARDIAC CATHETERIZATION  2008   no significant CAD  . CARDIAC CATHETERIZATION  04/2011   No significant CAD, moderate mitral stenosis (mean gradient: 12 mm Hg, MVA: 1.83), moderate pulmonary hypertension (PVR: 2.4 Woods units), Normal LVEDP.   Marland Kitchen CARDIAC CATHETERIZATION N/A 04/15/2015   Procedure: Right/Left Heart Cath and Coronary Angiography;  Surgeon: Wellington Hampshire, MD;  Location: [facility];  Service: Cardiovascular;  Laterality: N/A;  . CATARACT EXTRACTION Right   . CHOLECYSTECTOMY    . ELECTROPHYSIOLOGIC STUDY N/A 03/02/2015   Procedure: Cardioversion;  Surgeon: Rogue Jury  Ferne Reus, MD;  Location: [facility];  Service: Cardiovascular;  Laterality: N/A;  . MASTECTOMY Right   . PLACEMENT OF BREAST IMPLANTS     s/p mastectomy  . TEE WITHOUT CARDIOVERSION N/A 04/15/2015   Procedure: TRANSESOPHAGEAL ECHOCARDIOGRAM (TEE);  Surgeon: Dorothy Spark, MD;  Location: [facility];  Service: Cardiovascular;  Laterality: N/A;  . TOTAL ABDOMINAL HYSTERECTOMY       Current Outpatient Medications  Medication Sig Dispense Refill  . digoxin  (LANOXIN) 0.125 MG tablet TAKE 1 TABLET BY MOUTH EVERY DAY 90 tablet 0  . furosemide (LASIX) 40 MG tablet Take 1 tablet (40 mg total) by mouth daily. 90 tablet 2  . leflunomide (ARAVA) 20 MG tablet     . metoprolol tartrate (LOPRESSOR) 25 MG tablet Take 1 tablet (25 mg total) by mouth 3 (three) times daily. 270 tablet 0  . XARELTO 20 MG TABS tablet TAKE 1 TABLET BY MOUTH EVERY DAY 90 tablet 3   No current facility-administered medications for this visit.    Allergies:   Amoxicillin-pot clavulanate, Lidocaine, Other, Cefuroxime axetil, Nitrofuran derivatives, Pentazocine, Sulfa antibiotics, Methotrexate derivatives, Amiodarone, Clodronic acid, Darvon, Epinephrine, Levofloxacin, Pentazocine lactate, Procaine, Procaine hcl, Propoxyphene, and Septra [sulfamethoxazole w/trimethoprim (co-trimoxazole)]    Social History:  The patient  reports that she quit smoking about 36 years ago. Her smoking use included cigarettes. She has a 3.00 pack-year smoking history. She has never used smokeless tobacco. She reports that she does not drink alcohol or use drugs.   Family History:  The patient's family history includes CAD in her sister.    ROS:  Please see the history of present illness.   Otherwise, review of systems are positive for none.   All other systems are reviewed and negative.    PHYSICAL EXAM: VS:  BP 118/68   Pulse 82   Ht 5\' 8"  (1.727 m)   Wt 161 lb (73 kg)   SpO2 99%   BMI 24.48 kg/m  , BMI Body mass index is 24.48 kg/m. GEN: Well nourished, well developed, in no acute distress  HEENT: normal  Neck: no  carotid bruits, or masses. No JVD Cardiac: Irregularly irregular; no rubs, or gallops. There is 1/6 holosystolic murmur at the left sternal border. She no edema  Respiratory:  clear to auscultation bilaterally, normal work of breathing GI: soft, nontender, nondistended, + BS MS: no deformity or atrophy  Skin: warm and dry, no rash Neuro:  Strength and sensation are intact Psych:  euthymic mood, full affect   EKG:  EKG is ordered today. The ekg ordered today demonstrates atrial fibrillation with ventricular rate of 82 bpm.   Recent Labs: No results found for requested labs within last 8760 hours.    Lipid Panel No results found for: CHOL, TRIG, HDL, CHOLHDL, VLDL, LDLCALC, LDLDIRECT    Wt Readings from Last 3 Encounters:  11/21/19 161 lb (73 kg)  05/07/19 171 lb (77.6 kg)  03/05/19 180 lb (81.6 kg)       ASSESSMENT AND PLAN:  1. Chronic atrial fibrillation: Currently being treated with rate control.    Continue metoprolol 25 mg 3 times daily and low-dose digoxin.   Continue anticoagulation with Xarelto.  I requested routine labs today including digoxin level CBC and basic metabolic profile.  2.    Chronic diastolic heart failure with severe pulmonary hypertension: This has improved significantly after increasing furosemide to 40 mg once daily.  I will consider repeat echocardiogram in 6 months.  3. Moderate rheumatic  mitral stenosis: I reviewed recent echocardiogram which showed stable moderate mitral stenosis.   4.  Unintentional weight loss: she reports very poor appetite.  She might be depressed and I asked her to follow-up with her primary care physician regarding this.  She might require an antidepressant medication.  I will check TSH with her labs.   Disposition:   FU with me in 6 months  Signed,  Kathlyn Sacramento, MD  11/21/2019 2:44 PM    [place]

## 2019-11-22 ENCOUNTER — Other Ambulatory Visit: Payer: Self-pay

## 2019-11-22 LAB — COMPREHENSIVE METABOLIC PANEL
ALT: 13 IU/L (ref 0–32)
AST: 22 IU/L (ref 0–40)
Albumin/Globulin Ratio: 1.6 (ref 1.2–2.2)
Albumin: 4.5 g/dL (ref 3.7–4.7)
Alkaline Phosphatase: 81 IU/L (ref 39–117)
BUN/Creatinine Ratio: 18 (ref 12–28)
BUN: 15 mg/dL (ref 8–27)
Bilirubin Total: 0.9 mg/dL (ref 0.0–1.2)
CO2: 25 mmol/L (ref 20–29)
Calcium: 9.3 mg/dL (ref 8.7–10.3)
Chloride: 99 mmol/L (ref 96–106)
Creatinine, Ser: 0.84 mg/dL (ref 0.57–1.00)
GFR calc Af Amer: 78 mL/min/{1.73_m2} (ref >59)
GFR calc non Af Amer: 68 mL/min/{1.73_m2} (ref >59)
Globulin, Total: 2.8 g/dL (ref 1.5–4.5)
Glucose: 97 mg/dL (ref 65–99)
Potassium: 3.6 mmol/L (ref 3.5–5.2)
Sodium: 139 mmol/L (ref 134–144)
Total Protein: 7.3 g/dL (ref 6.0–8.5)

## 2019-11-22 LAB — CBC WITH DIFFERENTIAL/PLATELET
Basophils Absolute: 0.1 10*3/uL (ref 0.0–0.2)
Basos: 1 %
EOS (ABSOLUTE): 0.4 10*3/uL (ref 0.0–0.4)
Eos: 4 %
Hematocrit: 38.3 % (ref 34.0–46.6)
Hemoglobin: 12.8 g/dL (ref 11.1–15.9)
Immature Grans (Abs): 0.1 10*3/uL (ref 0.0–0.1)
Immature Granulocytes: 1 %
Lymphocytes Absolute: 1.6 10*3/uL (ref 0.7–3.1)
Lymphs: 18 %
MCH: 28.9 pg (ref 26.6–33.0)
MCHC: 33.4 g/dL (ref 31.5–35.7)
MCV: 87 fL (ref 79–97)
Monocytes Absolute: 0.8 10*3/uL (ref 0.1–0.9)
Monocytes: 9 %
Neutrophils Absolute: 6.2 10*3/uL (ref 1.4–7.0)
Neutrophils: 67 %
Platelets: 173 10*3/uL (ref 150–450)
RBC: 4.43 x10E6/uL (ref 3.77–5.28)
RDW: 13.2 % (ref 11.7–15.4)
WBC: 9.1 10*3/uL (ref 3.4–10.8)

## 2019-11-22 LAB — TSH: TSH: 3.28 u[IU]/mL (ref 0.450–4.500)

## 2019-11-22 LAB — DIGOXIN LEVEL: Digoxin, Serum: 0.7 ng/mL (ref 0.5–0.9)

## 2019-11-22 MED ORDER — DIGOXIN 125 MCG PO TABS: 125.0000 ug | ORAL_TABLET | Freq: Every day | ORAL | 2 refills | Status: DC

## 2019-12-06 DIAGNOSIS — H353231 Exudative age-related macular degeneration, bilateral, with active choroidal neovascularization: Secondary | ICD-10-CM | POA: Diagnosis not present

## 2020-01-06 ENCOUNTER — Other Ambulatory Visit: Payer: Self-pay | Admitting: Cardiovascular Disease

## 2020-01-07 DIAGNOSIS — I272 Pulmonary hypertension, unspecified: Secondary | ICD-10-CM | POA: Diagnosis not present

## 2020-01-07 DIAGNOSIS — Z853 Personal history of malignant neoplasm of breast: Secondary | ICD-10-CM | POA: Diagnosis not present

## 2020-01-07 DIAGNOSIS — Z789 Other specified health status: Secondary | ICD-10-CM | POA: Diagnosis not present

## 2020-01-07 DIAGNOSIS — R0600 Dyspnea, unspecified: Secondary | ICD-10-CM | POA: Diagnosis not present

## 2020-01-07 DIAGNOSIS — I509 Heart failure, unspecified: Secondary | ICD-10-CM | POA: Diagnosis not present

## 2020-01-08 DIAGNOSIS — H40032 Anatomical narrow angle, left eye: Secondary | ICD-10-CM | POA: Diagnosis not present

## 2020-01-22 ENCOUNTER — Other Ambulatory Visit: Payer: Self-pay

## 2020-01-22 MED ORDER — RIVAROXABAN 20 MG PO TABS: 20.0000 mg | ORAL_TABLET | Freq: Every day | ORAL | 1 refills | Status: DC

## 2020-02-25 ENCOUNTER — Other Ambulatory Visit: Payer: Self-pay | Admitting: Cardiovascular Disease

## 2020-02-26 NOTE — Telephone Encounter (Signed)
Refill Request.  

## 2020-02-26 NOTE — Telephone Encounter (Signed)
Pt's age 74, wt 77 kg, SCr 0.84, CrCl 65.66, last ov w/ Dr. Fletcher Anon 11/21/19.

## 2020-03-19 DIAGNOSIS — I509 Heart failure, unspecified: Secondary | ICD-10-CM | POA: Diagnosis not present

## 2020-03-19 DIAGNOSIS — R0600 Dyspnea, unspecified: Secondary | ICD-10-CM | POA: Diagnosis not present

## 2020-03-19 DIAGNOSIS — Z853 Personal history of malignant neoplasm of breast: Secondary | ICD-10-CM | POA: Diagnosis not present

## 2020-03-19 DIAGNOSIS — Z789 Other specified health status: Secondary | ICD-10-CM | POA: Diagnosis not present

## 2020-03-19 DIAGNOSIS — I272 Pulmonary hypertension, unspecified: Secondary | ICD-10-CM | POA: Diagnosis not present

## 2020-03-25 ENCOUNTER — Other Ambulatory Visit: Payer: Self-pay | Admitting: Cardiovascular Disease

## 2020-04-01 DIAGNOSIS — J019 Acute sinusitis, unspecified: Secondary | ICD-10-CM | POA: Diagnosis not present

## 2020-04-01 DIAGNOSIS — Z6827 Body mass index (BMI) 27.0-27.9, adult: Secondary | ICD-10-CM | POA: Diagnosis not present

## 2020-04-01 DIAGNOSIS — Z20828 Contact with and (suspected) exposure to other viral communicable diseases: Secondary | ICD-10-CM | POA: Diagnosis not present

## 2020-04-09 DIAGNOSIS — Z20828 Contact with and (suspected) exposure to other viral communicable diseases: Secondary | ICD-10-CM | POA: Diagnosis not present

## 2020-04-09 DIAGNOSIS — J019 Acute sinusitis, unspecified: Secondary | ICD-10-CM | POA: Diagnosis not present

## 2020-04-09 DIAGNOSIS — R05 Cough: Secondary | ICD-10-CM | POA: Diagnosis not present

## 2020-04-09 DIAGNOSIS — Z6827 Body mass index (BMI) 27.0-27.9, adult: Secondary | ICD-10-CM | POA: Diagnosis not present

## 2020-04-14 DIAGNOSIS — H353231 Exudative age-related macular degeneration, bilateral, with active choroidal neovascularization: Secondary | ICD-10-CM | POA: Diagnosis not present

## 2020-04-14 DIAGNOSIS — H4323 Crystalline deposits in vitreous body, bilateral: Secondary | ICD-10-CM | POA: Diagnosis not present

## 2020-05-01 DIAGNOSIS — S161XXA Strain of muscle, fascia and tendon at neck level, initial encounter: Secondary | ICD-10-CM | POA: Diagnosis not present

## 2020-05-01 DIAGNOSIS — R634 Abnormal weight loss: Secondary | ICD-10-CM | POA: Diagnosis not present

## 2020-05-01 DIAGNOSIS — H6120 Impacted cerumen, unspecified ear: Secondary | ICD-10-CM | POA: Diagnosis not present

## 2020-05-01 DIAGNOSIS — Z6824 Body mass index (BMI) 24.0-24.9, adult: Secondary | ICD-10-CM | POA: Diagnosis not present

## 2020-05-14 ENCOUNTER — Telehealth: Payer: Self-pay | Admitting: Cardiovascular Disease

## 2020-05-14 ENCOUNTER — Encounter: Payer: Self-pay | Admitting: Cardiovascular Disease

## 2020-05-14 ENCOUNTER — Ambulatory Visit: Payer: Medicare Other | Admitting: Cardiovascular Disease

## 2020-05-14 ENCOUNTER — Other Ambulatory Visit: Payer: Self-pay

## 2020-05-14 ENCOUNTER — Ambulatory Visit (INDEPENDENT_AMBULATORY_CARE_PROVIDER_SITE_OTHER): Payer: Medicare Other | Admitting: Cardiovascular Disease

## 2020-05-14 VITALS — BP 120/60 | HR 70 | Ht 68.0 in | Wt 155.0 lb

## 2020-05-14 DIAGNOSIS — I5032 Chronic diastolic (congestive) heart failure: Secondary | ICD-10-CM | POA: Diagnosis not present

## 2020-05-14 DIAGNOSIS — I050 Rheumatic mitral stenosis: Secondary | ICD-10-CM | POA: Diagnosis not present

## 2020-05-14 DIAGNOSIS — I4821 Permanent atrial fibrillation: Secondary | ICD-10-CM

## 2020-05-14 NOTE — Progress Notes (Signed)
Cardiology Office Note   Date:  05/14/2020   ID:  Taylor Weber, DOB [birth year], MRN [id number]  PCP:  Taylor Kaufman, PA-C  Cardiologist:   Taylor Sacramento, MD   Chief Complaint  Patient presents with  . office visit    6 month F/U-Patient reports blood in her urine. She is taking Xarelto. Meds verbally reviewed with patient.      History of Present Illness: Taylor Weber is a 74 y.o. 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  She is on anticoagulation with Xarelto without any reported side effects. In the past when she was on warfarin she had significant difficulty in maintaining therapeutic INR.   No coronary artery disease on previous cardiac catheterization. Most recent echocardiogram in July 2020 showed normal LV systolic function, stable moderate mitral stenosis but progression of pulmonary hypertension with estimated systolic pressure between 75 to 80 mmHg with significantly dilated IVC.  Since that time, we increased furosemide to 40 mg once daily . She has known history of rheumatoid arthritis with poor tolerance to medications.  She is currently not on anything.   She had significant depression over the past year after the death of her close friend.  She lost significant amount of weight.  She has been doing reasonably well with no recent chest pain or worsening dyspnea.  She has intermittent palpitations and tachycardia but usually these episodes do not last long.  She had hematuria yesterday and held her Xarelto last night.  She did not have any dysuria or discomfort.    Past Medical History:  Diagnosis Date  . Atherosclerotic cerebrovascular disease    nonobstructive  . Breast cancer ([place])    remote right breast mastectomy,bilaterial implants  . Breast cancer ([employer])    radical mastectomy status post billaterial breast implants and  right breast implat rupture  . Carotid arterial disease ([place])   . Carotid bruit    right   . Chest pain    normal coronary angiogram in december 2008  . Chronic diastolic congestive heart failure ([place])   . Diabetes mellitus    diet controlled  . Dyspnea    chronic exertional dyspnea related to intermittant atrial  . Mild aortic regurgitation with left ventricular dilation by prior echocardiogram   . Mitral stenosis    moderate  . Persistent atrial fibrillation ([place])    post cardioversion maintain normal sinus rhythm  . Pneumonia   . 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 Rheumatic fever    as a child  . Rheumatic mitral stenosis   . Silicone leakage from breast implant   . Tricuspid regurgitation   . Vertigo     Past Surgical History:  Procedure Laterality Date  . APPENDECTOMY    . CARDIAC CATHETERIZATION  2008   no significant CAD  . CARDIAC CATHETERIZATION  04/2011   No significant CAD, moderate mitral stenosis (mean gradient: 12 mm Hg, MVA: 1.83), moderate pulmonary hypertension (PVR: 2.4 Woods units), Normal LVEDP.   Marland Kitchen CARDIAC CATHETERIZATION N/A 04/15/2015   Procedure: Right/Left Heart Cath and Coronary Angiography;  Surgeon: Wellington Hampshire, MD;  Location: [facility];  Service: Cardiovascular;  Laterality: N/A;  . CATARACT EXTRACTION Right   . CHOLECYSTECTOMY    . ELECTROPHYSIOLOGIC STUDY N/A 03/02/2015   Procedure: Cardioversion;  Surgeon: Wellington Hampshire, MD;  Location: [facility];  Service: Cardiovascular;  Laterality: N/A;  . MASTECTOMY Right   . PLACEMENT OF BREAST IMPLANTS     s/p mastectomy  . TEE WITHOUT CARDIOVERSION N/A 04/15/2015   Procedure: TRANSESOPHAGEAL ECHOCARDIOGRAM (TEE);  Surgeon: Dorothy Spark, MD;  Location: [facility];  Service: Cardiovascular;  Laterality: N/A;  . TOTAL ABDOMINAL HYSTERECTOMY       Current Outpatient Medications  Medication Sig Dispense Refill  . brimonidine (ALPHAGAN) 0.2 %  ophthalmic solution Place 1 drop into the left eye in the morning, at noon, and at bedtime.    . digoxin (LANOXIN) 0.125 MG tablet Take 1 tablet (125 mcg total) by mouth daily. 90 tablet 2  . furosemide (LASIX) 40 MG tablet TAKE 1 TABLET BY MOUTH EVERY DAY 90 tablet 0  . metoprolol tartrate (LOPRESSOR) 25 MG tablet TAKE 1 TABLET BY MOUTH THREE TIMES A DAY 270 tablet 0  . XARELTO 20 MG TABS tablet TAKE 1 TABLET BY MOUTH EVERY DAY 90 tablet 1  . leflunomide (ARAVA) 20 MG tablet      No current facility-administered medications for this visit.    Allergies:   Amoxicillin-pot clavulanate, Lidocaine, Other, Cefuroxime axetil, Nitrofuran derivatives, Pentazocine, Sulfa antibiotics, Methotrexate derivatives, Amiodarone, Clodronic acid, Darvon, Epinephrine, Levofloxacin, Pentazocine lactate, Procaine, Procaine hcl, Propoxyphene, and Septra [sulfamethoxazole w/trimethoprim (co-trimoxazole)]    Social History:  The patient  reports that she quit smoking about 36 years ago. Her smoking use included cigarettes. She has a 3.00 pack-year smoking history. She has never used smokeless tobacco. She reports that she does not drink alcohol and does not use drugs.   Family History:  The patient's family history includes CAD in her sister.    ROS:  Please see the history of present illness.   Otherwise, review of systems are positive for none.   All other systems are reviewed and negative.    PHYSICAL EXAM: VS:  BP 120/60 (BP Location: Left Arm, Patient Position: Sitting, Cuff Size: Normal)   Pulse 70   Ht 5\' 8"  (1.727 m)   Wt 155 lb (70.3 kg)   SpO2 98%   BMI 23.57 kg/m  , BMI Body mass index is 23.57 kg/m. GEN: Well nourished, well developed, in no acute distress  HEENT: normal  Neck: no  carotid bruits, or masses. No JVD Cardiac: Irregularly irregular; no rubs, or gallops. There is 1/6 holosystolic murmur at the left sternal border. She no edema  Respiratory:  clear to auscultation bilaterally,  normal work of breathing GI: soft, nontender, nondistended, + BS MS: no deformity or atrophy  Skin: warm and dry, no rash Neuro:  Strength and sensation are intact Psych: euthymic mood, full affect   EKG:  EKG is ordered today. The ekg ordered today demonstrates atrial fibrillation with ventricular rate of 70 bpm.   Recent Labs: 11/21/2019: ALT 13; BUN 15; Creatinine, Ser 0.84; Hemoglobin 12.8; Platelets 173; Potassium 3.6; Sodium 139; TSH 3.280    Lipid Panel No results found for: CHOL, TRIG, HDL, CHOLHDL, VLDL, LDLCALC, LDLDIRECT    Wt Readings from Last 3 Encounters:  05/14/20 155 lb (70.3 kg)  11/21/19 161 lb (73 kg)  05/07/19 171 lb (77.6 kg)       ASSESSMENT AND PLAN:  1. Chronic atrial fibrillation: Currently being treated with rate control.    Continue metoprolol 25 mg 3 times daily and low-dose digoxin.   Continue anticoagulation with Xarelto.  I reviewed recent labs done last month which showed normal renal function and  CBC.  2.    Chronic diastolic heart failure with severe pulmonary hypertension: This has improved significantly after increasing furosemide to 40 mg once daily.  I am going to obtain a follow-up echocardiogram.  3. Moderate rheumatic mitral stenosis: I requested a follow-up echocardiogram.  4.  Hematuria: I asked her to resume Xarelto.  If she has recurrent hematuria, she will need urology evaluation.  I asked her to see her primary care physician about this.  Anticoagulation should be interrupted only if the bleeding is significant given high risk of stroke without anticoagulation.  5.  Arthritis: No contraindication for long-term steroid use if needed.   Disposition:   FU with me in 6 months  Signed,  Taylor Sacramento, MD  05/14/2020 2:31 PM    [place]

## 2020-05-14 NOTE — Telephone Encounter (Signed)
Patient late to check in and unable to be seen .  She wants a call to address holding blood thinner per previous call.

## 2020-05-14 NOTE — Patient Instructions (Signed)
Medication Instructions:  Your physician recommends that you continue on your current medications as directed. Please refer to the Current Medication list given to you today.  RESUME Xarelto 20 mg daily today.  Please talk with your primary care regarding the blood in your urine.   *If you need a refill on your cardiac medications before your next appointment, please call your pharmacy*   Lab Work: None ordered If you have labs (blood work) drawn today and your tests are completely normal, you will receive your results only by: Marland Kitchen MyChart Message (if you have MyChart) OR . A paper copy in the mail If you have any lab test that is abnormal or we need to change your treatment, we will call you to review the results.   Testing/Procedures: Your physician has requested that you have an echocardiogram. Echocardiography is a painless test that uses sound waves to create images of your heart. It provides your doctor with information about the size and shape of your heart and how well your heart's chambers and valves are working. This procedure takes approximately one hour. There are no restrictions for this procedure.     Follow-Up: At [facility], you and your health needs are our priority.  As part of our continuing mission to provide you with exceptional heart care, we have created designated Provider Care Teams.  These Care Teams include your primary Cardiologist (physician) and Advanced Practice Providers (APPs -  Physician Assistants and Nurse Practitioners) who all work together to provide you with the care you need, when you need it.  We recommend signing up for the patient portal called "MyChart".  Sign up information is provided on this After Visit Summary.  MyChart is used to connect with patients for Virtual Visits (Telemedicine).  Patients are able to view lab/test results, encounter notes, upcoming appointments, etc.  Non-urgent messages can be sent to your provider as well.   To  learn more about what you can do with MyChart, go to [email or web address].    Your next appointment:   6 month(s)  The format for your next appointment:   In Person  Provider:   You may see Kathlyn Sacramento, MD or one of the following Advanced Practice Providers on your designated Care Team:    Murray Hodgkins, NP  Christell Faith, PA-C  Marrianne Mood, PA-C  Cadence Kathlen Mody, [name]    Other Instructions N/A

## 2020-05-14 NOTE — Telephone Encounter (Signed)
Patient will be seen today by Dr. Fletcher Anon. Can be addressed during the o/v. Closing this encounter.

## 2020-05-20 DIAGNOSIS — M25571 Pain in right ankle and joints of right foot: Secondary | ICD-10-CM | POA: Diagnosis not present

## 2020-05-20 DIAGNOSIS — R5382 Chronic fatigue, unspecified: Secondary | ICD-10-CM | POA: Diagnosis not present

## 2020-05-20 DIAGNOSIS — Z79899 Other long term (current) drug therapy: Secondary | ICD-10-CM | POA: Diagnosis not present

## 2020-05-20 DIAGNOSIS — M25562 Pain in left knee: Secondary | ICD-10-CM | POA: Diagnosis not present

## 2020-05-20 DIAGNOSIS — Z6822 Body mass index (BMI) 22.0-22.9, adult: Secondary | ICD-10-CM | POA: Diagnosis not present

## 2020-05-20 DIAGNOSIS — M0609 Rheumatoid arthritis without rheumatoid factor, multiple sites: Secondary | ICD-10-CM | POA: Diagnosis not present

## 2020-05-20 DIAGNOSIS — Z111 Encounter for screening for respiratory tuberculosis: Secondary | ICD-10-CM | POA: Diagnosis not present

## 2020-05-20 DIAGNOSIS — M2550 Pain in unspecified joint: Secondary | ICD-10-CM | POA: Diagnosis not present

## 2020-06-02 ENCOUNTER — Other Ambulatory Visit: Payer: Medicare Other

## 2020-06-17 ENCOUNTER — Other Ambulatory Visit: Payer: Self-pay

## 2020-06-17 ENCOUNTER — Ambulatory Visit (INDEPENDENT_AMBULATORY_CARE_PROVIDER_SITE_OTHER): Payer: Medicare Other

## 2020-06-17 DIAGNOSIS — I050 Rheumatic mitral stenosis: Secondary | ICD-10-CM

## 2020-06-17 LAB — ECHOCARDIOGRAM COMPLETE
AR max vel: 1.69 cm2
AV Area VTI: 1.44 cm2
AV Area mean vel: 1.54 cm2
AV Mean grad: 3 mmHg
AV Peak grad: 5.6 mmHg
Ao pk vel: 1.18 m/s
P 1/2 time: 911 ms

## 2020-06-17 MED ORDER — PERFLUTREN LIPID MICROSPHERE
1.0000 mL | INTRAVENOUS | Status: AC | PRN
  Administered 2020-06-17: 2 mL via INTRAVENOUS

## 2020-06-21 ENCOUNTER — Other Ambulatory Visit: Payer: Self-pay | Admitting: Cardiovascular Disease

## 2020-06-23 ENCOUNTER — Ambulatory Visit: Payer: Medicare Other | Admitting: Cardiovascular Disease

## 2020-07-07 DIAGNOSIS — L299 Pruritus, unspecified: Secondary | ICD-10-CM | POA: Diagnosis not present

## 2020-07-07 DIAGNOSIS — D1801 Hemangioma of skin and subcutaneous tissue: Secondary | ICD-10-CM | POA: Diagnosis not present

## 2020-07-07 DIAGNOSIS — L853 Xerosis cutis: Secondary | ICD-10-CM | POA: Diagnosis not present

## 2020-07-07 DIAGNOSIS — L209 Atopic dermatitis, unspecified: Secondary | ICD-10-CM | POA: Diagnosis not present

## 2020-07-07 DIAGNOSIS — L821 Other seborrheic keratosis: Secondary | ICD-10-CM | POA: Diagnosis not present

## 2020-07-09 DIAGNOSIS — R509 Fever, unspecified: Secondary | ICD-10-CM | POA: Diagnosis not present

## 2020-07-09 DIAGNOSIS — R0982 Postnasal drip: Secondary | ICD-10-CM | POA: Diagnosis not present

## 2020-07-09 DIAGNOSIS — J029 Acute pharyngitis, unspecified: Secondary | ICD-10-CM | POA: Diagnosis not present

## 2020-07-24 ENCOUNTER — Other Ambulatory Visit: Payer: Self-pay | Admitting: Cardiovascular Disease

## 2020-07-26 ENCOUNTER — Other Ambulatory Visit: Payer: Self-pay | Admitting: Cardiovascular Disease

## 2020-07-26 DIAGNOSIS — I4821 Permanent atrial fibrillation: Secondary | ICD-10-CM

## 2020-07-27 DIAGNOSIS — Z23 Encounter for immunization: Secondary | ICD-10-CM | POA: Diagnosis not present

## 2020-07-27 NOTE — Telephone Encounter (Signed)
Prescription refill request for Xarelto received.  Indication: a fib Last office visit:05/14/20 Weight: 70kg Age: 74 Scr: 0.79 CrCl: 67 mL/min

## 2020-08-17 DIAGNOSIS — H353231 Exudative age-related macular degeneration, bilateral, with active choroidal neovascularization: Secondary | ICD-10-CM | POA: Diagnosis not present

## 2020-09-04 DIAGNOSIS — Z20828 Contact with and (suspected) exposure to other viral communicable diseases: Secondary | ICD-10-CM | POA: Diagnosis not present

## 2020-09-04 DIAGNOSIS — J029 Acute pharyngitis, unspecified: Secondary | ICD-10-CM | POA: Diagnosis not present

## 2020-09-04 DIAGNOSIS — R300 Dysuria: Secondary | ICD-10-CM | POA: Diagnosis not present

## 2020-09-15 DIAGNOSIS — N281 Cyst of kidney, acquired: Secondary | ICD-10-CM | POA: Diagnosis not present

## 2020-09-15 DIAGNOSIS — R319 Hematuria, unspecified: Secondary | ICD-10-CM | POA: Diagnosis not present

## 2020-10-06 ENCOUNTER — Telehealth: Payer: Self-pay | Admitting: Cardiovascular Disease

## 2020-10-06 DIAGNOSIS — R194 Change in bowel habit: Secondary | ICD-10-CM | POA: Diagnosis not present

## 2020-10-06 DIAGNOSIS — R634 Abnormal weight loss: Secondary | ICD-10-CM | POA: Diagnosis not present

## 2020-10-06 DIAGNOSIS — K625 Hemorrhage of anus and rectum: Secondary | ICD-10-CM | POA: Diagnosis not present

## 2020-10-06 NOTE — Telephone Encounter (Signed)
Patient with diagnosis of afib on Xarelto for anticoagulation.    Procedure: Upper and lower endoscopy Date of procedure: 10/21/20  CHA2DS2-VASc Score = 4  This indicates a 4.8% annual risk of stroke. The patient's score is based upon: CHF History: Yes HTN History: No Diabetes History: No Stroke History: No Vascular Disease History: No Age Score: 2 Gender Score: 1     CrCl 60 ml/min  Per office protocol, patient can hold Xarelto for 2 days prior to procedure.

## 2020-10-06 NOTE — Telephone Encounter (Signed)
75 yo female with - Perm AFib - (HFpEF) heart failure with preserved ejection fraction  - Mod Rheumatic MS - Mod pulmonary HTN - Rheumatoid arthritis  - Breast CA - Carotid artery dz - DM2 - 11/21/2019: Creatinine, Ser 0.84     Echo 06/17/2020: EF 50-55, mod mitral stenosis, mild-mod TR, mild AI Cath: 04/2015: normal coronary arteries  Last OV: 05/14/20, Dr. Fletcher Anon  Message was only for directions regarding anticoagulation.  However, given significant valve disease, will contact patient prior to providing clearance.  L/m for patient to call back. Richardson Dopp, PA-C    10/06/2020 3:09 PM

## 2020-10-06 NOTE — Telephone Encounter (Signed)
    [facility] Pre-operative Risk Assessment    HEARTCARE STAFF: - Please ensure there is not already an duplicate clearance open for this procedure. - Under Visit Info/Reason for Call, type in Other and utilize the format Clearance MM/DD/YY or Clearance TBD. Do not use dashes or single digits. - If request is for dental extraction, please clarify the # of teeth to be extracted.  Request for surgical clearance: FROM PATIENT CALL  1. What type of surgery is being performed? Upper and lower endoscopy   2. When is this surgery scheduled? 10-21-20   3. What type of clearance is required (medical clearance vs. Pharmacy clearance to hold med vs. Both)? pharmacy  4. Are there any medications that need to be held prior to surgery and how long? xarelto x 2 days prior  5. Practice name and name of physician performing surgery?  [facility]  6. What is the office phone number? Unknown    7.   What is the office fax number? Unknown  8.   Anesthesia type (None, local, MAC, general) ? Unknown    Clarisse Gouge 10/06/2020, 2:23 PM  _________________________________________________________________   (provider comments below)

## 2020-10-06 NOTE — Telephone Encounter (Signed)
Will route to PharmD for rec's re: holding anticoagulation. Richardson Dopp, PA-C    10/06/2020 2:37 PM

## 2020-10-09 NOTE — Telephone Encounter (Addendum)
Called home phone, cell phone - no answer and unable to leave VM. Called daughter's phone per DPR - left VM requesting call back.  Loel Dubonnet, NP

## 2020-10-12 NOTE — Telephone Encounter (Signed)
Spoke with nurse at [facility] medical office.  Reviewed recommendations for Xarelto 2-day hold.  She expressed understanding.  No further questions at this time.

## 2020-10-12 NOTE — Telephone Encounter (Signed)
Taylor Weber with Dr. Lynelle Doctor office calling in regards to pt holding eliquis. Please advise.

## 2020-10-12 NOTE — Telephone Encounter (Signed)
   Primary Cardiologist: Kathlyn Sacramento, MD  Chart reviewed as part of pre-operative protocol coverage. Given past medical history and time since last visit, based on ACC/AHA guidelines, Taylor Weber would be at acceptable risk for the planned procedure without further cardiovascular testing.   Patient with diagnosis of afib on Xarelto for anticoagulation.    Procedure: Upper and lower endoscopy Date of procedure: 10/21/20  CHA2DS2-VASc Score = 4  This indicates a 4.8% annual risk of stroke. The patient's score is based upon: CHF History: Yes HTN History: No Diabetes History: No Stroke History: No Vascular Disease History: No Age Score: 2 Gender Score: 1     CrCl 60 ml/min  Per office protocol, patient can hold Xarelto for 2 days prior to procedure.  Patient was advised that if she develops new symptoms prior to surgery to contact our office to arrange a follow-up appointment.  He verbalized understanding.  I will route this recommendation to the requesting party via Epic fax function and remove from pre-op pool.  Please call with questions.  Jossie Ng. Cleaver NP-C    10/12/2020, 1:57 PM [facility] [place] [address] Office [phone] Fax [phone]

## 2020-10-12 NOTE — Telephone Encounter (Signed)
Add on  When you call [phone], press option 1

## 2020-10-18 ENCOUNTER — Other Ambulatory Visit: Payer: Self-pay | Admitting: Cardiovascular Disease

## 2020-10-19 DIAGNOSIS — K2950 Unspecified chronic gastritis without bleeding: Secondary | ICD-10-CM | POA: Diagnosis not present

## 2020-10-19 DIAGNOSIS — K625 Hemorrhage of anus and rectum: Secondary | ICD-10-CM | POA: Diagnosis not present

## 2020-10-19 DIAGNOSIS — R10816 Epigastric abdominal tenderness: Secondary | ICD-10-CM | POA: Diagnosis not present

## 2020-10-19 DIAGNOSIS — Z01810 Encounter for preprocedural cardiovascular examination: Secondary | ICD-10-CM | POA: Diagnosis not present

## 2020-10-21 DIAGNOSIS — K2980 Duodenitis without bleeding: Secondary | ICD-10-CM | POA: Diagnosis not present

## 2020-10-21 DIAGNOSIS — R194 Change in bowel habit: Secondary | ICD-10-CM | POA: Diagnosis not present

## 2020-10-21 DIAGNOSIS — R0602 Shortness of breath: Secondary | ICD-10-CM | POA: Diagnosis not present

## 2020-10-21 DIAGNOSIS — K625 Hemorrhage of anus and rectum: Secondary | ICD-10-CM | POA: Diagnosis not present

## 2020-10-21 DIAGNOSIS — K2970 Gastritis, unspecified, without bleeding: Secondary | ICD-10-CM | POA: Diagnosis not present

## 2020-10-21 DIAGNOSIS — K209 Esophagitis, unspecified without bleeding: Secondary | ICD-10-CM | POA: Diagnosis not present

## 2020-10-21 DIAGNOSIS — R634 Abnormal weight loss: Secondary | ICD-10-CM | POA: Diagnosis not present

## 2020-10-21 DIAGNOSIS — K2950 Unspecified chronic gastritis without bleeding: Secondary | ICD-10-CM | POA: Diagnosis not present

## 2020-10-21 DIAGNOSIS — I4891 Unspecified atrial fibrillation: Secondary | ICD-10-CM | POA: Diagnosis not present

## 2020-10-21 DIAGNOSIS — K2990 Gastroduodenitis, unspecified, without bleeding: Secondary | ICD-10-CM | POA: Diagnosis not present

## 2020-10-21 DIAGNOSIS — R10816 Epigastric abdominal tenderness: Secondary | ICD-10-CM | POA: Diagnosis not present

## 2020-10-21 DIAGNOSIS — K5520 Angiodysplasia of colon without hemorrhage: Secondary | ICD-10-CM | POA: Diagnosis not present

## 2020-10-21 DIAGNOSIS — K5730 Diverticulosis of large intestine without perforation or abscess without bleeding: Secondary | ICD-10-CM | POA: Diagnosis not present

## 2020-10-30 ENCOUNTER — Other Ambulatory Visit: Payer: Self-pay | Admitting: Cardiovascular Disease

## 2020-12-03 ENCOUNTER — Encounter: Payer: Self-pay | Admitting: Cardiovascular Disease

## 2020-12-03 ENCOUNTER — Ambulatory Visit (INDEPENDENT_AMBULATORY_CARE_PROVIDER_SITE_OTHER): Payer: Medicare Other | Admitting: Cardiovascular Disease

## 2020-12-03 ENCOUNTER — Other Ambulatory Visit: Payer: Self-pay

## 2020-12-03 VITALS — BP 110/70 | HR 59 | Ht 68.0 in | Wt 163.0 lb

## 2020-12-03 DIAGNOSIS — I482 Chronic atrial fibrillation, unspecified: Secondary | ICD-10-CM

## 2020-12-03 DIAGNOSIS — I5032 Chronic diastolic (congestive) heart failure: Secondary | ICD-10-CM

## 2020-12-03 DIAGNOSIS — I050 Rheumatic mitral stenosis: Secondary | ICD-10-CM | POA: Diagnosis not present

## 2020-12-03 DIAGNOSIS — M1990 Unspecified osteoarthritis, unspecified site: Secondary | ICD-10-CM

## 2020-12-03 NOTE — Patient Instructions (Signed)
Medication Instructions:  Your physician recommends that you continue on your current medications as directed. Please refer to the Current Medication list given to you today.  *If you need a refill on your cardiac medications before your next appointment, please call your pharmacy*   Lab Work: Bmp, Cbc, Digoxin level today   COVID PRE- TEST: You will need a COVID TEST prior to the procedure:  LOCATION: [place] Admission office. First floor office to the right of the entrance.  DATE/TIME: 12/14/20  (anytime between 8 am and 1 pm)    If you have labs (blood work) drawn today and your tests are completely normal, you will receive your results only by: Marland Kitchen MyChart Message (if you have MyChart) OR . A paper copy in the mail If you have any lab test that is abnormal or we need to change your treatment, we will call you to review the results.   Testing/Procedures: Your physician has requested that you have a cardiac catheterization. Cardiac catheterization is used to diagnose and/or treat various heart conditions. Doctors may recommend this procedure for a number of different reasons. The most common reason is to evaluate chest pain. Chest pain can be a symptom of coronary artery disease (CAD), and cardiac catheterization can show whether plaque is narrowing or blocking your heart's arteries. This procedure is also used to evaluate the valves, as well as measure the blood flow and oxygen levels in different parts of your heart. For further information please visit [email or web address]. Please follow instruction sheet, as given.     Follow-Up: At [facility], you and your health needs are our priority.  As part of our continuing mission to provide you with exceptional heart care, we have created designated Provider Care Teams.  These Care Teams include your primary Cardiologist (physician) and Advanced Practice Providers (APPs -  Physician Assistants and Nurse Practitioners) who  all work together to provide you with the care you need, when you need it.  We recommend signing up for the patient portal called "MyChart".  Sign up information is provided on this After Visit Summary.  MyChart is used to connect with patients for Virtual Visits (Telemedicine).  Patients are able to view lab/test results, encounter notes, upcoming appointments, etc.  Non-urgent messages can be sent to your provider as well.   To learn more about what you can do with MyChart, go to [email or web address].    Your next appointment:   4 week(s)  The format for your next appointment:   In Person  Provider:   You may see Kathlyn Sacramento, MD or one of the following Advanced Practice Providers on your designated Care Team:    Murray Hodgkins, NP  Christell Faith, PA-C  Marrianne Mood, PA-C  Cadence Kathlen Mody, [name]  Laurann Montana, NP    Other Instructions    [place], [place] [address] Dept: [place]: [place]  12/03/2020  You are scheduled for a Cardiac Catheterization on Wednesday, May 11 with Dr. Kathlyn Sacramento.  1. Please arrive at the [facility] (Main Entrance A) at [facility]: [address] [place], [place] [address] at 6:30 AM (This time is two hours before your procedure to ensure your preparation). Free valet parking service is available.   Special note: Every effort is made to have your procedure done on time. Please understand that emergencies sometimes delay scheduled procedures.  2.  Diet: Do not eat solid foods after midnight.  The patient may have clear liquids until 5am upon the day of the procedure.  3. Labs: You will need to have blood drawn today. You will need a COVID test on 12/14/20  4. Medication instructions in preparation for your procedure:   Contrast Allergy: No   STOP Taking Xarelto on Sun Dec 10, 2020       On the morning of your procedure, take your any morning medicines NOT listed above.  You may use sips of water.  5. Plan for one night stay--bring personal belongings. 6. Bring a current list of your medications and current insurance cards. 7. You MUST have a responsible person to drive you home. 8. Someone MUST be with you the first 24 hours after you arrive home or your discharge will be delayed. 9. Please wear clothes that are easy to get on and off and wear slip-on shoes.  Thank you for allowing [place] to care for you!   -- [facility]

## 2020-12-03 NOTE — H&P (View-Only) (Signed)
Cardiology Office Note   Date:  12/03/2020   ID:  Taylor Weber, Taylor Weber [birth year], MRN [id number]  PCP:  Rosalee Kaufman, PA-C  Cardiologist:   Kathlyn Sacramento, MD   Chief Complaint  Patient presents with  . Other    6 month f/u c/o sob, fluctuating HR,edema and fatigue. Meds reviewed verbally with pt.      History of Present Illness: Taylor Weber is a 75 y.o. 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  She is on anticoagulation with Xarelto without any reported side effects. In the past when she was on warfarin she had significant difficulty in maintaining therapeutic INR.   No coronary artery disease on previous cardiac catheterization. Echocardiogram in July 2020 showed normal LV systolic function, stable moderate mitral stenosis but progression of pulmonary hypertension with estimated systolic pressure between 75 to 80 mmHg with significantly dilated IVC.  Since that time, we increased furosemide to 40 mg once daily . She has known history of rheumatoid arthritis with poor tolerance to medications.  She is currently not on anything.   She had significant depression over the past year after the death of her close friend.  She lost significant amount of weight.   She had hematuria with Xarelto but that has resolved.  Most recent echocardiogram in November of last year showed an EF of 50 to 55%, severe pulmonary hypertension with estimated systolic pulmonary pressure of 71.6 mmHg with moderate to severe mitral stenosis. Over the last few months, she reports significant worsening of dyspnea as well as orthopnea.  She also noticed increased swelling in lower extremity.  No chest pain.   Past Medical History:  Diagnosis Date  . Atherosclerotic cerebrovascular disease    nonobstructive  . Breast cancer ([place])    remote right breast  mastectomy,bilaterial implants  . Breast cancer ([place])    radical mastectomy status post billaterial breast implants and right breast implat rupture  . Carotid arterial disease ([place])   . Carotid bruit    right   . Chest pain    normal coronary angiogram in december 2008  . Chronic diastolic congestive heart failure ([place])   . Diabetes mellitus    diet controlled  . Dyspnea    chronic exertional dyspnea related to intermittant atrial  . Mild aortic regurgitation with left ventricular dilation by prior echocardiogram   . Mitral stenosis    moderate  . Persistent atrial fibrillation ([place])    post cardioversion maintain normal sinus rhythm  . Pneumonia   . 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 Rheumatic fever    as a child  . Rheumatic mitral stenosis   . Silicone leakage from breast implant   . Tricuspid regurgitation   . Vertigo     Past Surgical History:  Procedure Laterality Date  . APPENDECTOMY    . CARDIAC CATHETERIZATION  2008   no significant CAD  . CARDIAC CATHETERIZATION  04/2011   No significant CAD, moderate mitral stenosis (mean gradient: 12 mm Hg, MVA: 1.83), moderate pulmonary hypertension (PVR: 2.4 Woods units), Normal LVEDP.   Marland Kitchen CARDIAC CATHETERIZATION N/A 04/15/2015   Procedure: Right/Left Heart Cath and Coronary Angiography;  Surgeon: Wellington Hampshire, MD;  Location: [place] [facility];  Service: Cardiovascular;  Laterality: N/A;  . CATARACT EXTRACTION Right   . CHOLECYSTECTOMY    .  ELECTROPHYSIOLOGIC STUDY N/A 03/02/2015   Procedure: Cardioversion;  Surgeon: Wellington Hampshire, MD;  Location: [facility] ORS;  Service: Cardiovascular;  Laterality: N/A;  . MASTECTOMY Right   . PLACEMENT OF BREAST IMPLANTS     s/p mastectomy  . TEE WITHOUT CARDIOVERSION N/A 04/15/2015   Procedure: TRANSESOPHAGEAL ECHOCARDIOGRAM (TEE);  Surgeon: Dorothy Spark, MD;  Location: [facility];  Service: Cardiovascular;  Laterality: N/A;  . TOTAL ABDOMINAL  HYSTERECTOMY       Current Outpatient Medications  Medication Sig Dispense Refill  . brimonidine (ALPHAGAN) 0.2 % ophthalmic solution Place 1 drop into the left eye in the morning, at noon, and at bedtime.    . digoxin (LANOXIN) 0.125 MG tablet TAKE 1 TABLET BY MOUTH EVERY DAY 90 tablet 3  . furosemide (LASIX) 40 MG tablet TAKE 1 TABLET BY MOUTH EVERY DAY 90 tablet 0  . metoprolol tartrate (LOPRESSOR) 25 MG tablet TAKE 1 TABLET BY MOUTH THREE TIMES A DAY 270 tablet 0  . XARELTO 20 MG TABS tablet TAKE 1 TABLET BY MOUTH EVERY DAY 90 tablet 1   No current facility-administered medications for this visit.    Allergies:   Amoxicillin-pot clavulanate, Lidocaine, Other, Cefuroxime axetil, Nitrofuran derivatives, Pentazocine, Sulfa antibiotics, Methotrexate derivatives, Amiodarone, Clodronic acid, Darvon, Epinephrine, Levofloxacin, Pentazocine lactate, Procaine, Procaine hcl, Propoxyphene, and Septra [sulfamethoxazole w/trimethoprim (co-trimoxazole)]    Social History:  The patient  reports that she quit smoking about 37 years ago. Her smoking use included cigarettes. She has a 3.00 pack-year smoking history. She has never used smokeless tobacco. She reports that she does not drink alcohol and does not use drugs.   Family History:  The patient's family history includes CAD in her sister.    ROS:  Please see the history of present illness.   Otherwise, review of systems are positive for none.   All other systems are reviewed and negative.    PHYSICAL EXAM: VS:  BP 110/70 (BP Location: Left Arm, Patient Position: Sitting, Cuff Size: Normal)   Ht 5\' 8"  (1.727 m)   Wt 163 lb (73.9 kg)   SpO2 98%   BMI 24.78 kg/m  , BMI Body mass index is 24.78 kg/m. GEN: Well nourished, well developed, in no acute distress  HEENT: normal  Neck: no  carotid bruits, or masses.  Significant JVD Cardiac: Irregularly irregular; no rubs, or gallops. There is 1/6 holosystolic murmur at the left sternal border.   There is also a faint diastolic murmur she mild bilateral edema  Respiratory:  clear to auscultation bilaterally, normal work of breathing GI: soft, nontender, nondistended, + BS MS: no deformity or atrophy  Skin: warm and dry, no rash Neuro:  Strength and sensation are intact Psych: euthymic mood, full affect Right radial pulses normal.  EKG:  EKG is ordered today. The ekg ordered today demonstrates atrial fibrillation with ventricular rate of 59 bpm.  Low voltage.   Recent Labs: No results found for requested labs within last 8760 hours.    Lipid Panel No results found for: CHOL, TRIG, HDL, CHOLHDL, VLDL, LDLCALC, LDLDIRECT    Wt Readings from Last 3 Encounters:  12/03/20 163 lb (73.9 kg)  05/14/20 155 lb (70.3 kg)  11/21/19 161 lb (73 kg)       ASSESSMENT AND PLAN:  1. Chronic atrial fibrillation: Currently being treated with rate control.    Continue metoprolol 25 mg 3 times daily and low-dose digoxin.  Check digoxin level. Continue anticoagulation with Xarelto but hold before cardiac cath.Marland Kitchen  2.    Acute on chronic diastolic heart failure with severe pulmonary hypertension: She is now having worsening symptoms of heart failure and she appears to be volume overloaded by physical exam and spite of furosemide.  Most of the findings are suggestive of right-sided heart failure which is likely due to pulmonary hypertension and mitral stenosis.  Given significant worsening in her symptoms, I recommend proceeding with a right and left cardiac catheterization possible PCI.  I discussed procedure in details as well as risk and benefits.  Hold Xarelto 2 days before the procedure.  3. Moderate to severe rheumatic mitral stenosis: Given worsening symptoms of heart failure, will proceed with right and left cardiac catheterization as outlined above.     Disposition:   FU with me in 1 months  Signed,  Kathlyn Sacramento, MD  12/03/2020 9:10 AM    [place]

## 2020-12-03 NOTE — Progress Notes (Signed)
Cardiology Office Note   Date:  12/03/2020   ID:  Taylor, Weber Mar 22, 1946, MRN [id number]  PCP:  Rosalee Kaufman, PA-C  Cardiologist:   Kathlyn Sacramento, MD   Chief Complaint  Patient presents with  . Other    6 month f/u c/o sob, fluctuating HR,edema and fatigue. Meds reviewed verbally with pt.      History of Present Illness: Taylor Weber is a 75 y.o. 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  She is on anticoagulation with Xarelto without any reported side effects. In the past when she was on warfarin she had significant difficulty in maintaining therapeutic INR.   No coronary artery disease on previous cardiac catheterization. Echocardiogram in July 2020 showed normal LV systolic function, stable moderate mitral stenosis but progression of pulmonary hypertension with estimated systolic pressure between 75 to 80 mmHg with significantly dilated IVC.  Since that time, we increased furosemide to 40 mg once daily . She has known history of rheumatoid arthritis with poor tolerance to medications.  She is currently not on anything.   She had significant depression over the past year after the death of her close friend.  She lost significant amount of weight.   She had hematuria with Xarelto but that has resolved.  Most recent echocardiogram in November of last year showed an EF of 50 to 55%, severe pulmonary hypertension with estimated systolic pulmonary pressure of 71.6 mmHg with moderate to severe mitral stenosis. Over the last few months, she reports significant worsening of dyspnea as well as orthopnea.  She also noticed increased swelling in lower extremity.  No chest pain.   Past Medical History:  Diagnosis Date  . Atherosclerotic cerebrovascular disease    nonobstructive  . Breast cancer ([place])    remote right breast  mastectomy,bilaterial implants  . Breast cancer ([place])    radical mastectomy status post billaterial breast implants and right breast implat rupture  . Carotid arterial disease ([place])   . Carotid bruit    right   . Chest pain    normal coronary angiogram in december 2008  . Chronic diastolic congestive heart failure ([place])   . Diabetes mellitus    diet controlled  . Dyspnea    chronic exertional dyspnea related to intermittant atrial  . Mild aortic regurgitation with left ventricular dilation by prior echocardiogram   . Mitral stenosis    moderate  . Persistent atrial fibrillation ([place])    post cardioversion maintain normal sinus rhythm  . Pneumonia   . 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 Rheumatic fever    as a child  . Rheumatic mitral stenosis   . Silicone leakage from breast implant   . Tricuspid regurgitation   . Vertigo     Past Surgical History:  Procedure Laterality Date  . APPENDECTOMY    . CARDIAC CATHETERIZATION  2008   no significant CAD  . CARDIAC CATHETERIZATION  04/2011   No significant CAD, moderate mitral stenosis (mean gradient: 12 mm Hg, MVA: 1.83), moderate pulmonary hypertension (PVR: 2.4 Woods units), Normal LVEDP.   Marland Kitchen CARDIAC CATHETERIZATION N/A 04/15/2015   Procedure: Right/Left Heart Cath and Coronary Angiography;  Surgeon: Wellington Hampshire, MD;  Location: [facility];  Service: Cardiovascular;  Laterality: N/A;  . CATARACT EXTRACTION Right   . CHOLECYSTECTOMY    .  ELECTROPHYSIOLOGIC STUDY N/A 03/02/2015   Procedure: Cardioversion;  Surgeon: Wellington Hampshire, MD;  Location: [facility] ORS;  Service: Cardiovascular;  Laterality: N/A;  . MASTECTOMY Right   . PLACEMENT OF BREAST IMPLANTS     s/p mastectomy  . TEE WITHOUT CARDIOVERSION N/A 04/15/2015   Procedure: TRANSESOPHAGEAL ECHOCARDIOGRAM (TEE);  Surgeon: Dorothy Spark, MD;  Location: [facility];  Service: Cardiovascular;  Laterality: N/A;  . TOTAL ABDOMINAL  HYSTERECTOMY       Current Outpatient Medications  Medication Sig Dispense Refill  . brimonidine (ALPHAGAN) 0.2 % ophthalmic solution Place 1 drop into the left eye in the morning, at noon, and at bedtime.    . digoxin (LANOXIN) 0.125 MG tablet TAKE 1 TABLET BY MOUTH EVERY DAY 90 tablet 3  . furosemide (LASIX) 40 MG tablet TAKE 1 TABLET BY MOUTH EVERY DAY 90 tablet 0  . metoprolol tartrate (LOPRESSOR) 25 MG tablet TAKE 1 TABLET BY MOUTH THREE TIMES A DAY 270 tablet 0  . XARELTO 20 MG TABS tablet TAKE 1 TABLET BY MOUTH EVERY DAY 90 tablet 1   No current facility-administered medications for this visit.    Allergies:   Amoxicillin-pot clavulanate, Lidocaine, Other, Cefuroxime axetil, Nitrofuran derivatives, Pentazocine, Sulfa antibiotics, Methotrexate derivatives, Amiodarone, Clodronic acid, Darvon, Epinephrine, Levofloxacin, Pentazocine lactate, Procaine, Procaine hcl, Propoxyphene, and Septra [sulfamethoxazole w/trimethoprim (co-trimoxazole)]    Social History:  The patient  reports that she quit smoking about 37 years ago. Her smoking use included cigarettes. She has a 3.00 pack-year smoking history. She has never used smokeless tobacco. She reports that she does not drink alcohol and does not use drugs.   Family History:  The patient's family history includes CAD in her sister.    ROS:  Please see the history of present illness.   Otherwise, review of systems are positive for none.   All other systems are reviewed and negative.    PHYSICAL EXAM: VS:  BP 110/70 (BP Location: Left Arm, Patient Position: Sitting, Cuff Size: Normal)   Ht 5\' 8"  (1.727 m)   Wt 163 lb (73.9 kg)   SpO2 98%   BMI 24.78 kg/m  , BMI Body mass index is 24.78 kg/m. GEN: Well nourished, well developed, in no acute distress  HEENT: normal  Neck: no  carotid bruits, or masses.  Significant JVD Cardiac: Irregularly irregular; no rubs, or gallops. There is 1/6 holosystolic murmur at the left sternal border.   There is also a faint diastolic murmur she mild bilateral edema  Respiratory:  clear to auscultation bilaterally, normal work of breathing GI: soft, nontender, nondistended, + BS MS: no deformity or atrophy  Skin: warm and dry, no rash Neuro:  Strength and sensation are intact Psych: euthymic mood, full affect Right radial pulses normal.  EKG:  EKG is ordered today. The ekg ordered today demonstrates atrial fibrillation with ventricular rate of 59 bpm.  Low voltage.   Recent Labs: No results found for requested labs within last 8760 hours.    Lipid Panel No results found for: CHOL, TRIG, HDL, CHOLHDL, VLDL, LDLCALC, LDLDIRECT    Wt Readings from Last 3 Encounters:  12/03/20 163 lb (73.9 kg)  05/14/20 155 lb (70.3 kg)  11/21/19 161 lb (73 kg)       ASSESSMENT AND PLAN:  1. Chronic atrial fibrillation: Currently being treated with rate control.    Continue metoprolol 25 mg 3 times daily and low-dose digoxin.  Check digoxin level. Continue anticoagulation with Xarelto but hold before cardiac cath.Marland Kitchen  2.    Acute on chronic diastolic heart failure with severe pulmonary hypertension: She is now having worsening symptoms of heart failure and she appears to be volume overloaded by physical exam and spite of furosemide.  Most of the findings are suggestive of right-sided heart failure which is likely due to pulmonary hypertension and mitral stenosis.  Given significant worsening in her symptoms, I recommend proceeding with a right and left cardiac catheterization possible PCI.  I discussed procedure in details as well as risk and benefits.  Hold Xarelto 2 days before the procedure.  3. Moderate to severe rheumatic mitral stenosis: Given worsening symptoms of heart failure, will proceed with right and left cardiac catheterization as outlined above.     Disposition:   FU with me in 1 months  Signed,  Kathlyn Sacramento, MD  12/03/2020 9:10 AM    [place]

## 2020-12-04 LAB — CBC WITH DIFFERENTIAL/PLATELET
Basophils Absolute: 0.1 10*3/uL (ref 0.0–0.2)
Basos: 1 %
EOS (ABSOLUTE): 0.2 10*3/uL (ref 0.0–0.4)
Eos: 2 %
Hematocrit: 38.8 % (ref 34.0–46.6)
Hemoglobin: 13 g/dL (ref 11.1–15.9)
Immature Grans (Abs): 0 10*3/uL (ref 0.0–0.1)
Immature Granulocytes: 1 %
Lymphocytes Absolute: 1.2 10*3/uL (ref 0.7–3.1)
Lymphs: 19 %
MCH: 28.9 pg (ref 26.6–33.0)
MCHC: 33.5 g/dL (ref 31.5–35.7)
MCV: 86 fL (ref 79–97)
Monocytes Absolute: 0.7 10*3/uL (ref 0.1–0.9)
Monocytes: 11 %
Neutrophils Absolute: 4.2 10*3/uL (ref 1.4–7.0)
Neutrophils: 66 %
Platelets: 150 10*3/uL (ref 150–450)
RBC: 4.5 x10E6/uL (ref 3.77–5.28)
RDW: 14.4 % (ref 11.7–15.4)
WBC: 6.3 10*3/uL (ref 3.4–10.8)

## 2020-12-04 LAB — BASIC METABOLIC PANEL
BUN/Creatinine Ratio: 19 (ref 12–28)
BUN: 17 mg/dL (ref 8–27)
CO2: 21 mmol/L (ref 20–29)
Calcium: 8.9 mg/dL (ref 8.7–10.3)
Chloride: 101 mmol/L (ref 96–106)
Creatinine, Ser: 0.9 mg/dL (ref 0.57–1.00)
Glucose: 105 mg/dL — ABNORMAL HIGH (ref 65–99)
Potassium: 4.3 mmol/L (ref 3.5–5.2)
Sodium: 135 mmol/L (ref 134–144)
eGFR: 66 mL/min/{1.73_m2} (ref >59)

## 2020-12-04 LAB — DIGOXIN LEVEL: Digoxin, Serum: 1 ng/mL — ABNORMAL HIGH (ref 0.5–0.9)

## 2020-12-08 DIAGNOSIS — H353231 Exudative age-related macular degeneration, bilateral, with active choroidal neovascularization: Secondary | ICD-10-CM | POA: Diagnosis not present

## 2020-12-09 ENCOUNTER — Telehealth: Payer: Self-pay | Admitting: Cardiovascular Disease

## 2020-12-09 NOTE — Telephone Encounter (Signed)
DPR on file. Spoke with the patients daughter Minette Headland. Clarified that the date listed on the patients AVS was in error. Patient should take her last dose of Xarelto on Sun 12/13/20 in anticipation for her cardiac cath on 12/16/20.  Minette Headland sts that the patient would also prefer to have her preadmit COVID test in [place]. Cancelled the COVID test scheduled at [facility]. Scheduled the patient at the [facility] test site on 12/14/20. Provided the address to Minette Headland, adv them to arrive between 8am-1pm.  I apologized about the confusion and thanked Minette Headland for calling to clarify.

## 2020-12-09 NOTE — Telephone Encounter (Signed)
Patient daughter calling  AVS states she should stop Xarelto Sun May 5th so she wants to know if she should stop on May 5th or on Sunday She would also like to know if she can have COVID test done at [facility] instead of [facility] because it would be closer for her  Please call daughter to discuss or leave VM

## 2020-12-14 ENCOUNTER — Other Ambulatory Visit: Payer: Medicare Other

## 2020-12-14 ENCOUNTER — Telehealth: Payer: Self-pay | Admitting: *Deleted

## 2020-12-14 ENCOUNTER — Other Ambulatory Visit (HOSPITAL_COMMUNITY)
Admission: RE | Admit: 2020-12-14 | Discharge: 2020-12-14 | Disposition: A | Discharge status: Home / Self Care | Payer: Medicare Other | Source: Ambulatory Visit | Attending: Cardiovascular Disease | Admitting: Cardiovascular Disease

## 2020-12-14 DIAGNOSIS — Z01812 Encounter for preprocedural laboratory examination: Secondary | ICD-10-CM | POA: Diagnosis not present

## 2020-12-14 DIAGNOSIS — Z20822 Contact with and (suspected) exposure to covid-19: Secondary | ICD-10-CM | POA: Diagnosis not present

## 2020-12-14 LAB — SARS CORONAVIRUS 2 (TAT 6-24 HRS): SARS Coronavirus 2: NEGATIVE

## 2020-12-14 NOTE — Telephone Encounter (Addendum)
Pt contacted pre-catheterization scheduled at [facility] for: Wednesday Dec 16, 2020 8:30 AM Verified arrival time and place: [facility]) at: 6:30 AM   No solid food after midnight prior to cath, clear liquids until 5 AM day of procedure.  Hold: Xarelto-none 12/14/20 until post procedure Lasix-AM of procedure  Except hold medications AM meds can be  taken pre-cath with sips of water including: ASA 81 mg   Confirmed patient has responsible adult to drive home post procedure and be with patient first 24 hours after arriving home: yes  You are allowed ONE visitor in the waiting room during the time you are at the hospital for your procedure. Both you and your visitor must wear a mask once you enter the hospital.   Reviewed procedure/mask/visitor instructions with patient.

## 2020-12-16 ENCOUNTER — Encounter (HOSPITAL_COMMUNITY)
Admission: RE | Disposition: A | Discharge status: Home / Self Care | Payer: Self-pay | Source: Home / Self Care | Attending: Cardiovascular Disease

## 2020-12-16 ENCOUNTER — Ambulatory Visit (HOSPITAL_COMMUNITY)
Admission: RE | Admit: 2020-12-16 | Discharge: 2020-12-16 | Disposition: A | Discharge status: Home / Self Care | Payer: Medicare Other | Attending: Cardiovascular Disease | Admitting: Cardiovascular Disease

## 2020-12-16 DIAGNOSIS — Z882 Allergy status to sulfonamides status: Secondary | ICD-10-CM | POA: Insufficient documentation

## 2020-12-16 DIAGNOSIS — I050 Rheumatic mitral stenosis: Secondary | ICD-10-CM | POA: Diagnosis not present

## 2020-12-16 DIAGNOSIS — Z7901 Long term (current) use of anticoagulants: Secondary | ICD-10-CM | POA: Insufficient documentation

## 2020-12-16 DIAGNOSIS — Z881 Allergy status to other antibiotic agents status: Secondary | ICD-10-CM | POA: Diagnosis not present

## 2020-12-16 DIAGNOSIS — Z79899 Other long term (current) drug therapy: Secondary | ICD-10-CM | POA: Insufficient documentation

## 2020-12-16 DIAGNOSIS — I5032 Chronic diastolic (congestive) heart failure: Secondary | ICD-10-CM | POA: Diagnosis not present

## 2020-12-16 DIAGNOSIS — Z880 Allergy status to penicillin: Secondary | ICD-10-CM | POA: Insufficient documentation

## 2020-12-16 DIAGNOSIS — I272 Pulmonary hypertension, unspecified: Secondary | ICD-10-CM | POA: Diagnosis not present

## 2020-12-16 DIAGNOSIS — I5033 Acute on chronic diastolic (congestive) heart failure: Secondary | ICD-10-CM | POA: Insufficient documentation

## 2020-12-16 DIAGNOSIS — Z87891 Personal history of nicotine dependence: Secondary | ICD-10-CM | POA: Diagnosis not present

## 2020-12-16 DIAGNOSIS — I2510 Atherosclerotic heart disease of native coronary artery without angina pectoris: Secondary | ICD-10-CM | POA: Diagnosis not present

## 2020-12-16 DIAGNOSIS — I482 Chronic atrial fibrillation, unspecified: Secondary | ICD-10-CM | POA: Insufficient documentation

## 2020-12-16 HISTORY — PX: RIGHT/LEFT HEART CATH AND CORONARY ANGIOGRAPHY: CATH118266

## 2020-12-16 LAB — POCT I-STAT 7, (LYTES, BLD GAS, ICA,H+H)
Acid-base deficit: 2 mmol/L (ref 0.0–2.0)
Bicarbonate: 23 mmol/L (ref 20.0–28.0)
Calcium, Ion: 1.18 mmol/L (ref 1.15–1.40)
HCT: 35 % — ABNORMAL LOW (ref 36.0–46.0)
Hemoglobin: 11.9 g/dL — ABNORMAL LOW (ref 12.0–15.0)
O2 Saturation: 99 %
Potassium: 3.7 mmol/L (ref 3.5–5.1)
Sodium: 139 mmol/L (ref 135–145)
TCO2: 24 mmol/L (ref 22–32)
pCO2 arterial: 38.8 mmHg (ref 32.0–48.0)
pH, Arterial: 7.381 (ref 7.350–7.450)
pO2, Arterial: 168 mmHg — ABNORMAL HIGH (ref 83.0–108.0)

## 2020-12-16 LAB — POCT I-STAT EG7
Acid-Base Excess: 0 mmol/L (ref 0.0–2.0)
Bicarbonate: 25.6 mmol/L (ref 20.0–28.0)
Calcium, Ion: 1.25 mmol/L (ref 1.15–1.40)
HCT: 37 % (ref 36.0–46.0)
Hemoglobin: 12.6 g/dL (ref 12.0–15.0)
O2 Saturation: 72 %
Potassium: 3.9 mmol/L (ref 3.5–5.1)
Sodium: 138 mmol/L (ref 135–145)
TCO2: 27 mmol/L (ref 22–32)
pCO2, Ven: 45.6 mmHg (ref 44.0–60.0)
pH, Ven: 7.357 (ref 7.250–7.430)
pO2, Ven: 40 mmHg (ref 32.0–45.0)

## 2020-12-16 LAB — GLUCOSE, CAPILLARY
Glucose-Capillary: 120 mg/dL — ABNORMAL HIGH (ref 70–99)
Glucose-Capillary: 111 mg/dL — ABNORMAL HIGH (ref 70–99)

## 2020-12-16 SURGERY — RIGHT/LEFT HEART CATH AND CORONARY ANGIOGRAPHY
Anesthesia: LOCAL

## 2020-12-16 MED ORDER — LIDOCAINE HCL (PF) 1 % IJ SOLN
INTRAMUSCULAR | Status: AC
  Filled 2020-12-16: qty 30

## 2020-12-16 MED ORDER — SODIUM CHLORIDE 0.9 % IV SOLN: INTRAVENOUS | Status: DC

## 2020-12-16 MED ORDER — ASPIRIN 81 MG PO CHEW: 81.0000 mg | CHEWABLE_TABLET | ORAL | Status: DC

## 2020-12-16 MED ORDER — FENTANYL CITRATE (PF) 100 MCG/2ML IJ SOLN
INTRAMUSCULAR | Status: DC | PRN
  Administered 2020-12-16: 50 ug via INTRAVENOUS

## 2020-12-16 MED ORDER — FENTANYL CITRATE (PF) 100 MCG/2ML IJ SOLN
INTRAMUSCULAR | Status: AC
  Filled 2020-12-16: qty 2

## 2020-12-16 MED ORDER — HEPARIN (PORCINE) IN NACL 1000-0.9 UT/500ML-% IV SOLN
INTRAVENOUS | Status: AC
  Filled 2020-12-16: qty 1000

## 2020-12-16 MED ORDER — SODIUM CHLORIDE 0.9 % IV SOLN
250.0000 mL | INTRAVENOUS | Status: DC | PRN
Start: 2020-12-16 — End: 2020-12-16

## 2020-12-16 MED ORDER — SODIUM CHLORIDE 0.9% FLUSH: 3.0000 mL | INTRAVENOUS | Status: DC | PRN

## 2020-12-16 MED ORDER — VERAPAMIL HCL 2.5 MG/ML IV SOLN
INTRAVENOUS | Status: AC
  Filled 2020-12-16: qty 2

## 2020-12-16 MED ORDER — ACETAMINOPHEN 325 MG PO TABS: 650.0000 mg | ORAL_TABLET | ORAL | Status: DC | PRN

## 2020-12-16 MED ORDER — SODIUM CHLORIDE 0.9 % IV SOLN: 250.0000 mL | INTRAVENOUS | Status: DC | PRN

## 2020-12-16 MED ORDER — SODIUM CHLORIDE 0.9% FLUSH: 3.0000 mL | Freq: Two times a day (BID) | INTRAVENOUS | Status: DC

## 2020-12-16 MED ORDER — MIDAZOLAM HCL 2 MG/2ML IJ SOLN
INTRAMUSCULAR | Status: DC | PRN
  Administered 2020-12-16: 1 mg via INTRAVENOUS

## 2020-12-16 MED ORDER — LIDOCAINE HCL (PF) 1 % IJ SOLN
INTRAMUSCULAR | Status: DC | PRN
  Administered 2020-12-16: 10 mL via INTRADERMAL

## 2020-12-16 MED ORDER — ONDANSETRON HCL 4 MG/2ML IJ SOLN: 4.0000 mg | Freq: Four times a day (QID) | INTRAMUSCULAR | Status: DC | PRN

## 2020-12-16 MED ORDER — MIDAZOLAM HCL 2 MG/2ML IJ SOLN
INTRAMUSCULAR | Status: AC
  Filled 2020-12-16: qty 2

## 2020-12-16 MED ORDER — HEPARIN (PORCINE) IN NACL 1000-0.9 UT/500ML-% IV SOLN
INTRAVENOUS | Status: DC | PRN
  Administered 2020-12-16 (×2): 500 mL

## 2020-12-16 MED ORDER — HEPARIN SODIUM (PORCINE) 1000 UNIT/ML IJ SOLN
INTRAMUSCULAR | Status: AC
  Filled 2020-12-16: qty 1

## 2020-12-16 SURGICAL SUPPLY — 16 items
CATH INFINITI 5FR MULTPACK ANG (CATHETERS) ×1 IMPLANT
CATH SWAN GANZ 7F STRAIGHT (CATHETERS) ×1 IMPLANT
DEVICE CLOSURE MYNXGRIP 5F (Vascular Products) ×1 IMPLANT
KIT HEART LEFT (KITS) ×2 IMPLANT
KIT MICROPUNCTURE NIT STIFF (SHEATH) ×1 IMPLANT
PACK CARDIAC CATHETERIZATION (CUSTOM PROCEDURE TRAY) ×2 IMPLANT
SHEATH PINNACLE 5F 10CM (SHEATH) ×1 IMPLANT
SHEATH PINNACLE 7F 10CM (SHEATH) ×1 IMPLANT
SHEATH PROBE COVER 6X72 (BAG) ×1 IMPLANT
SYR MEDRAD MARK 7 150ML (SYRINGE) ×2 IMPLANT
TRANSDUCER W/STOPCOCK (MISCELLANEOUS) ×3 IMPLANT
TUBING ART PRESS 72  MALE/FEM (TUBING) ×2
TUBING ART PRESS 72 MALE/FEM (TUBING) IMPLANT
TUBING CIL FLEX 10 FLL-RA (TUBING) ×2 IMPLANT
WIRE EMERALD 3MM-J .025X260CM (WIRE) ×1 IMPLANT
WIRE EMERALD 3MM-J .035X150CM (WIRE) ×1 IMPLANT

## 2020-12-16 NOTE — Discharge Instructions (Signed)
Femoral Site Care  This sheet gives you information about how to care for yourself after your procedure. Your health care provider may also give you more specific instructions. If you have problems or questions, contact your health care provider. What can I expect after the procedure? After the procedure, it is common to have:  Bruising that usually fades within 1-2 weeks.  Tenderness at the site. Follow these instructions at home: Wound care  Follow instructions from your health care provider about how to take care of your insertion site. Make sure you: ? Wash your hands with soap and water before you change your bandage (dressing). If soap and water are not available, use hand sanitizer. ? Change your dressing as told by your health care provider. ? Leave stitches (sutures), skin glue, or adhesive strips in place. These skin closures may need to stay in place for 2 weeks or longer. If adhesive strip edges start to loosen and curl up, you may trim the loose edges. Do not remove adhesive strips completely unless your health care provider tells you to do that.  Do not take baths, swim, or use a hot tub until your health care provider approves.  You may shower 24-48 hours after the procedure or as told by your health care provider. ? Gently wash the site with plain soap and water. ? Pat the area dry with a clean towel. ? Do not rub the site. This may cause bleeding.  Do not apply powder or lotion to the site. Keep the site clean and dry.  Check your femoral site every day for signs of infection. Check for: ? Redness, swelling, or pain. ? Fluid or blood. ? Warmth. ? Pus or a bad smell. Activity  For the first 2-3 days after your procedure, or as long as directed: ? Avoid climbing stairs as much as possible. ? Do not squat.  Do not lift anything that is heavier than 10 lb (4.5 kg), or the limit that you are told, until your health care provider says that it is safe.  Rest as  directed. ? Avoid sitting for a long time without moving. Get up to take short walks every 1-2 hours.  Do not drive for 24 hours if you were given a medicine to help you relax (sedative). General instructions  Take over-the-counter and prescription medicines only as told by your health care provider.  Keep all follow-up visits as told by your health care provider. This is important. Contact a health care provider if you have:  A fever or chills.  You have redness, swelling, or pain around your insertion site. Get help right away if:  The catheter insertion area swells very fast.  You pass out.  You suddenly start to sweat or your skin gets clammy.  The catheter insertion area is bleeding, and the bleeding does not stop when you hold steady pressure on the area.  The area near or just beyond the catheter insertion site becomes pale, cool, tingly, or numb. These symptoms may represent a serious problem that is an emergency. Do not wait to see if the symptoms will go away. Get medical help right away. Call your local emergency services (911 in the [place] Do not drive yourself to the hospital. Summary  After the procedure, it is common to have bruising that usually fades within 1-2 weeks.  Check your femoral site every day for signs of infection.  Do not lift anything that is heavier than 10 lb (4.5 kg), or   the limit that you are told, until your health care provider says that it is safe. This information is not intended to replace advice given to you by your health care provider. Make sure you discuss any questions you have with your health care provider. Document Revised: 03/27/2020 Document Reviewed: 03/27/2020 Elsevier Patient Education  [place]. Heart Failure Eating Plan Heart failure, also called congestive heart failure, occurs when your heart does not pump blood well enough to meet your body's needs for oxygen-rich blood. Heart failure is a long-term (chronic)  condition. Living with heart failure can be challenging. Following your health care provider's instructions about a healthy lifestyle and working with a dietitian to choose the right foods may help to improve your symptoms. An eating plan for someone with heart failure will include changes that limit the intake of salt (sodium) and unhealthy fat. What are tips for following this plan? Reading food labels  Check food labels for the amount of sodium per serving. Choose foods that have less than 140 mg (milligrams) of sodium in each serving.  Check food labels for the number of calories per serving. This is important if you need to limit your daily calorie intake to lose weight.  Check food labels for the serving size. If you eat more than one serving, you will be eating more sodium and calories than what is listed on the label.  Look for foods that are labeled as "sodium-free," "very low sodium," or "low sodium." ? Foods labeled as "reduced sodium" or "lightly salted" may still have more sodium than what is recommended for you. Cooking  Avoid adding salt when cooking. Ask your health care provider or dietitian before using salt substitutes.  Season food with salt-free seasonings, spices, or herbs. Check the label of seasoning mixes to make sure they do not contain salt.  Cook with heart-healthy oils, such as olive, canola, soybean, or sunflower oil.  Do not fry foods. Cook foods using low-fat methods, such as baking, boiling, grilling, and broiling.  Limit unhealthy fats when cooking by: ? Removing the skin from poultry, such as chicken. ? Removing all visible fats from meats. ? Skimming the fat off from stews, soups, and gravies before serving them. Meal planning  Limit your intake of: ? Processed, canned, or prepackaged foods. ? Foods that are high in trans fat, such as fried foods. ? Sweets, desserts, sugary drinks, and other foods with added sugar. ? Full-fat dairy products, such as  whole milk.  Eat a balanced diet. This may include: ? 4-5 servings of fruit each day and 4-5 servings of vegetables each day. At each meal, try to fill one-half of your plate with fruits and vegetables. ? Up to 6-8 servings of whole grains each day. ? Up to 2 servings of lean meat, poultry, or fish each day. One serving of meat is equal to 3 oz (85 g). This is about the same size as a deck of cards. ? 2 servings of low-fat dairy each day. ? Heart-healthy fats. Healthy fats called omega-3 fatty acids are found in foods such as flaxseed and cold-water fish like sardines, salmon, and mackerel.  Aim to eat 25-35 g (grams) of fiber a day. Foods that are high in fiber include apples, broccoli, carrots, beans, peas, and whole grains.  Do not add salt or condiments that contain salt (such as soy sauce) to foods before eating.  When eating at a restaurant, ask that your food be prepared with less salt or  no salt, if possible.  Try to eat 2 or more vegetarian meals each week.  Eat more home-cooked food and eat less restaurant, buffet, and fast food.   General information  Do not eat more than 2,300 mg of sodium a day. The amount of sodium that is recommended for you may be lower, depending on your condition.  Maintain a healthy body weight as directed. Ask your health care provider what a healthy weight is for you. ? Check your weight every day. ? Work with your health care provider and dietitian to make a plan that is right for you to lose weight or maintain your current weight.  Limit how much fluid you drink. Ask your health care provider or dietitian how much fluid you can have each day.  Limit or avoid alcohol as told by your health care provider or dietitian. Recommended foods Fruits All fresh, frozen, and canned fruits. Dried fruits, such as raisins, prunes, and cranberries. Vegetables All fresh vegetables. Vegetables that are frozen without sauce or added salt. Low-sodium or  sodium-free canned vegetables. Grains Bread with less than 80 mg of sodium per slice. Whole-wheat pasta, quinoa, and brown rice. Oats and oatmeal. Barley. [place]. Grits and cream of wheat. Whole-grain and whole-wheat cold cereal. Meats and other protein foods Lean cuts of meat. Skinless chicken and [place]. Fish with high omega-3 fatty acids, such as salmon, sardines, and other cold-water fishes. Eggs. Dried beans, peas, and edamame. Unsalted nuts and nut butters. Dairy Low-fat or nonfat (skim) milk and dried milk. Rice milk, soy milk, and almond milk. Low-fat or nonfat yogurt. Small amounts of reduced-sodium block cheese. Low-sodium cottage cheese. Fats and oils Olive, canola, soybean, flaxseed, avocado, or sunflower oil. Sweets and desserts Applesauce. Granola bars. Sugar-free pudding and gelatin. Frozen fruit bars. Seasoning and other foods Fresh and dried herbs. Lemon or lime juice. Vinegar. Low-sodium ketchup. Salt-free marinades, salad dressings, sauces, and seasonings. The items listed above may not be a complete list of foods and beverages you can eat. Contact a dietitian for more information. Foods to avoid Fruits Fruits that are dried with sodium-containing preservatives. Vegetables Canned vegetables. Frozen vegetables with sauce or seasonings. Creamed vegetables. [place] fries. Onion rings. Pickled vegetables and sauerkraut. Grains Bread with more than 80 mg of sodium per slice. Hot or cold cereal with more than 140 mg sodium per serving. Salted pretzels and crackers. Prepackaged breadcrumbs. Bagels, croissants, and biscuits. Meats and other protein foods Ribs and chicken wings. Bacon, ham, pepperoni, bologna, salami, and packaged luncheon meats. Hot dogs, bratwurst, and sausage. Canned meat. Smoked meat and fish. Salted nuts and seeds. Dairy Whole milk, half-and-half, and cream. Buttermilk. Processed cheese, cheese spreads, and cheese curds. Regular cottage cheese. Feta cheese.  Shredded cheese. String cheese. Fats and oils Butter, lard, shortening, ghee, and bacon fat. Canned and packaged gravies. Seasoning and other foods Onion salt, garlic salt, table salt, and sea salt. Marinades. Regular salad dressings. Relishes, pickles, and olives. Meat flavorings and tenderizers, and bouillon cubes. Horseradish, ketchup, and mustard. Worcestershire sauce. Teriyaki sauce, soy sauce (including reduced sodium). Hot sauce and Tabasco sauce. Steak sauce, fish sauce, oyster sauce, and cocktail sauce. Taco seasonings. Barbecue sauce. Tartar sauce. The items listed above may not be a complete list of foods and beverages you should avoid. Contact a dietitian for more information. Summary  A heart failure eating plan includes changes that limit your intake of sodium and unhealthy fat, and it may help you lose weight or maintain a healthy weight.  Your health care provider may also recommend limiting how much fluid you drink.  Most people with heart failure should eat no more than 2,300 mg of salt (sodium) a day. The amount of sodium that is recommended for you may be lower, depending on your condition.  Contact your health care provider or dietitian before making any major changes to your diet. This information is not intended to replace advice given to you by your health care provider. Make sure you discuss any questions you have with your health care provider. Document Revised: 03/09/2020 Document Reviewed: 03/09/2020 Elsevier Patient Education  2021 [place].   Heart Failure, Diagnosis  Heart failure means that your heart is not able to pump blood in the right way. This makes it hard for your body to work well. Heart failure is usually a long-term (chronic) condition. You must take good care of yourself and follow your treatment plan from your doctor. What are the causes?  High blood pressure.  Buildup of cholesterol and fat in the arteries.  Heart attack. This injures the  heart muscle.  Heart valves that do not open and close properly.  Damage of the heart muscle. This is also called cardiomyopathy.  Infection of the heart muscle. This is also called myocarditis.  Lung disease. What increases the risk?  Getting older. The risk of heart failure goes up as a person ages.  Being overweight.  Being female.  Use tobacco or nicotine products.  Abusing alcohol or drugs.  Having taken medicines that can damage the heart.  Having any of these conditions: ? Diabetes. ? Abnormal heart rhythms. ? Thyroid problems. ? Low blood counts (anemia).  Having a family history of heart failure. What are the signs or symptoms?  Shortness of breath.  Coughing.  Swelling of the feet, ankles, legs, or belly.  Losing or gaining weight for no reason.  Trouble breathing.  Waking from sleep because of the need to sit up and get more air.  Fast heartbeat.  Being very tired.  Feeling dizzy, or feeling like you may pass out (faint).  Having no desire to eat.  Feeling like you may vomit (nauseous).  Peeing (urinating) more at night.  Feeling confused. How is this treated? This condition may be treated with:  Medicines. These can be given to treat blood pressure and to make the heart muscles stronger.  Changes in your daily life. These may include: ? Eating a healthy diet. ? Staying at a healthy body weight. ? Quitting tobacco, alcohol, and drug use. ? Doing exercises. ? Participating in a cardiac rehabilitation program. This program helps you improve your health through exercise, education, and counseling.  Surgery. Surgery can be done to open blocked valves, or to put devices in the heart, such as pacemakers.  A donor heart (heart transplant). You will receive a healthy heart from a donor. Follow these instructions at home:  Treat other conditions as told by your doctor. These may include high blood pressure, diabetes, thyroid disease, or  abnormal heart rhythms.  Learn as much as you can about heart failure.  Get support as you need it.  Keep all follow-up visits. Summary  Heart failure means that your heart is not able to pump blood in the right way.  This condition is often caused by high blood pressure, heart attack, or damage of the heart muscle.  Symptoms of this condition include shortness of breath and swelling of the feet, ankles, legs, or belly. You may also feel  very tired or feel like you may vomit.  You may be treated with medicines, surgery, or changes in your daily life.  Treat other health conditions as told by your doctor. This information is not intended to replace advice given to you by your health care provider. Make sure you discuss any questions you have with your health care provider. Document Revised: 02/15/2020 Document Reviewed: 02/15/2020 Elsevier Patient Education  [place].

## 2020-12-16 NOTE — Interval H&P Note (Signed)
History and Physical Interval Note:  12/16/2020 8:50 AM  Taylor Weber  has presented today for surgery, with the diagnosis of pulmonary HTN.  The various methods of treatment have been discussed with the patient and family. After consideration of risks, benefits and other options for treatment, the patient has consented to  Procedure(s): RIGHT/LEFT HEART CATH AND CORONARY ANGIOGRAPHY (N/A) as a surgical intervention.  The patient's history has been reviewed, patient examined, no change in status, stable for surgery.  I have reviewed the patient's chart and labs.  Questions were answered to the patient's satisfaction.     Kathlyn [place]

## 2020-12-16 NOTE — Progress Notes (Signed)
Discharge instructions given to patient and daughter.  Patient was able to verbalize instructions.

## 2020-12-16 NOTE — Progress Notes (Signed)
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  Reported to Dr Fletcher Anon who stated to put all IVs on the (L).

## 2020-12-17 ENCOUNTER — Encounter (HOSPITAL_COMMUNITY): Payer: Self-pay | Admitting: Cardiovascular Disease

## 2021-01-01 ENCOUNTER — Ambulatory Visit: Payer: Medicare Other | Admitting: Physician Assistant

## 2021-01-01 ENCOUNTER — Telehealth: Payer: Self-pay | Admitting: Cardiovascular Disease

## 2021-01-01 NOTE — Telephone Encounter (Signed)
Patient calling to discuss cath results .  She states it has been several weeks since procedure and she is concerned that she has not been able to get into office to see Dr. Fletcher Anon .

## 2021-01-01 NOTE — Telephone Encounter (Signed)
Patient returning call. Scheduled next clinic day with Dr. Fletcher Anon 6/10 at 35 - ok to overbook.   Patient understands agrees with change to see Dr. Fletcher Anon.   Added to wait list .

## 2021-01-01 NOTE — Telephone Encounter (Signed)
Called the patient. Lmtcb.  Patient will need a asap appt with Dr. Fletcher Anon only.

## 2021-01-01 NOTE — Telephone Encounter (Signed)
Will route to Dr. Arida to advise. 

## 2021-01-01 NOTE — Telephone Encounter (Signed)
Her follow-up appointment should be specifically with me.  I want to see how she is doing clinically.  If she is improved, no need to do anything for now but if she is still with symptoms, we will need to consider referral for mitral valve surgery.

## 2021-01-04 ENCOUNTER — Telehealth: Payer: Self-pay | Admitting: Internal Medicine

## 2021-01-04 NOTE — Telephone Encounter (Signed)
Taylor Weber called stating that she had at least three separate instances of painless hematuria since this afternoon. She is inquiring whether or not she should take her Rivaroxaban tonight. She states that she is able to see her PCP in the AM for evaluation, so I instructed her to hold tonight's dose of Rivaroxaban until she can be evaluated in person. Patient voiced understanding.

## 2021-01-05 DIAGNOSIS — R110 Nausea: Secondary | ICD-10-CM | POA: Diagnosis not present

## 2021-01-05 DIAGNOSIS — Z6824 Body mass index (BMI) 24.0-24.9, adult: Secondary | ICD-10-CM | POA: Diagnosis not present

## 2021-01-05 DIAGNOSIS — R319 Hematuria, unspecified: Secondary | ICD-10-CM | POA: Diagnosis not present

## 2021-01-08 ENCOUNTER — Ambulatory Visit: Payer: Medicare Other | Admitting: Physician Assistant

## 2021-01-15 ENCOUNTER — Encounter: Payer: Self-pay | Admitting: Cardiovascular Disease

## 2021-01-15 ENCOUNTER — Other Ambulatory Visit: Payer: Self-pay

## 2021-01-15 ENCOUNTER — Ambulatory Visit (INDEPENDENT_AMBULATORY_CARE_PROVIDER_SITE_OTHER): Payer: Medicare Other | Admitting: Cardiovascular Disease

## 2021-01-15 VITALS — BP 138/78 | HR 73 | Ht 68.0 in | Wt 152.2 lb

## 2021-01-15 DIAGNOSIS — I5032 Chronic diastolic (congestive) heart failure: Secondary | ICD-10-CM

## 2021-01-15 DIAGNOSIS — I272 Pulmonary hypertension, unspecified: Secondary | ICD-10-CM

## 2021-01-15 DIAGNOSIS — I482 Chronic atrial fibrillation, unspecified: Secondary | ICD-10-CM

## 2021-01-15 DIAGNOSIS — I050 Rheumatic mitral stenosis: Secondary | ICD-10-CM | POA: Diagnosis not present

## 2021-01-15 MED ORDER — METOPROLOL TARTRATE 50 MG PO TABS: 50.0000 mg | ORAL_TABLET | Freq: Two times a day (BID) | ORAL | 1 refills | Status: DC

## 2021-01-15 NOTE — Progress Notes (Signed)
Cardiology Office Note   Date:  01/15/2021   ID:  Taylor Weber, DOB 06-28-1946, MRN [id number]  PCP:  Rosalee Kaufman, PA-C  Cardiologist:   Kathlyn Sacramento, MD   Chief Complaint  Patient presents with   Other    Post cath c/o sob with exertion and mentioned that she had some bleeding recently but no longer bleeding. Meds reviewed verbally with pt.       History of Present Illness: Taylor Weber is a 75 y.o. 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  She is on anticoagulation with Xarelto without any reported side effects. In the past when she was on warfarin she had significant difficulty in maintaining therapeutic INR.   Echocardiogram in July 2020 showed normal LV systolic function, stable moderate mitral stenosis but progression of pulmonary hypertension with estimated systolic pressure between 75 to 80 mmHg with significantly dilated IVC.  Since that time, we increased furosemide to 40 mg once daily . She has known history of rheumatoid arthritis with poor tolerance to medications.  She is currently not on anything.   She had significant depression over the past year after the death of her close friend.  She lost significant amount of weight.   She had hematuria with Xarelto but that has resolved.  Most recent echocardiogram in November of last year showed an EF of 50 to 55%, severe pulmonary hypertension with estimated systolic pulmonary pressure of 71.6 mmHg with moderate to severe mitral stenosis.  She was seen recently for worsening exertional dyspnea, leg edema and orthopnea.  Given her symptoms, I proceeded with a right and left cardiac catheterization which overall showed no significant coronary artery disease.  Right heart catheterization showed normal right-sided filling pressure, normal LVEDP, mildly elevated wedge pressure with  mild to moderate pulmonary hypertension and normal cardiac output.  Mitral stenosis was moderate to severe with a mean gradient of 75mmHg and valve area of 1.04.  Pulmonary pressure was 45/13 with a mean of 26 mmHg.  Her symptoms include dyspnea with minimal exertion which has progressed significantly over the last few months.  Her heart rate goes up to around 120 bpm with minimal activities and that is when she feels the most out of breath.  No chest pain.   Past Medical History:  Diagnosis Date   Atherosclerotic cerebrovascular disease    nonobstructive   Breast cancer ([place])    remote right breast mastectomy,bilaterial implants   Breast cancer ([place])    radical mastectomy status post billaterial breast implants and right breast implat rupture   Carotid arterial disease ([facility])    Carotid bruit    right    Chest pain    normal coronary angiogram in december 2008   Chronic diastolic congestive heart failure ([facility])    Diabetes mellitus    diet controlled   Dyspnea    chronic exertional dyspnea related to intermittant atrial   Mild aortic regurgitation with left ventricular dilation by prior echocardiogram    Mitral stenosis    moderate   Persistent atrial fibrillation ([place])    post cardioversion maintain normal sinus rhythm   Pneumonia    Pulmonary hypertension due to mitral valve disease ([facility])    Moderate   Rheumatic fever    as a child   Rheumatic mitral stenosis    Silicone leakage from breast implant    Tricuspid regurgitation  Vertigo     Past Surgical History:  Procedure Laterality Date   APPENDECTOMY     CARDIAC CATHETERIZATION  2008   no significant CAD   CARDIAC CATHETERIZATION  04/2011   No significant CAD, moderate mitral stenosis (mean gradient: 12 mm Hg, MVA: 1.83), moderate pulmonary hypertension (PVR: 2.4 [place] units), Normal LVEDP.    CARDIAC CATHETERIZATION N/A 04/15/2015   Procedure: Right/Left Heart Cath and Coronary Angiography;  Surgeon: Wellington Hampshire, MD;  Location: [facility];  Service: Cardiovascular;  Laterality: N/A;   CATARACT EXTRACTION Right    CHOLECYSTECTOMY     ELECTROPHYSIOLOGIC STUDY N/A 03/02/2015   Procedure: Cardioversion;  Surgeon: Wellington Hampshire, MD;  Location: [facility];  Service: Cardiovascular;  Laterality: N/A;   MASTECTOMY Right    PLACEMENT OF BREAST IMPLANTS     s/p mastectomy   RIGHT/LEFT HEART CATH AND CORONARY ANGIOGRAPHY N/A 12/16/2020   Procedure: RIGHT/LEFT HEART CATH AND CORONARY ANGIOGRAPHY;  Surgeon: Wellington Hampshire, MD;  Location: [place] [facility];  Service: Cardiovascular;  Laterality: N/A;   TEE WITHOUT CARDIOVERSION N/A 04/15/2015   Procedure: TRANSESOPHAGEAL ECHOCARDIOGRAM (TEE);  Surgeon: Dorothy Spark, MD;  Location: [facility];  Service: Cardiovascular;  Laterality: N/A;   TOTAL ABDOMINAL HYSTERECTOMY       Current Outpatient Medications  Medication Sig Dispense Refill   brimonidine (ALPHAGAN) 0.2 % ophthalmic solution Place 1 drop into the left eye in the morning, at noon, and at bedtime.     digoxin (LANOXIN) 0.125 MG tablet TAKE 1 TABLET BY MOUTH EVERY DAY 90 tablet 3   furosemide (LASIX) 40 MG tablet TAKE 1 TABLET BY MOUTH EVERY DAY 90 tablet 0   XARELTO 20 MG TABS tablet TAKE 1 TABLET BY MOUTH EVERY DAY 90 tablet 1   metoprolol tartrate (LOPRESSOR) 50 MG tablet Take 1 tablet (50 mg total) by mouth 2 (two) times daily. 180 tablet 1   No current facility-administered medications for this visit.    Allergies:   Amoxicillin-pot clavulanate, Lidocaine, Other, Cefuroxime axetil, Nitrofuran derivatives, Pentazocine, Sulfa antibiotics, Methotrexate derivatives, Amiodarone, Clodronic acid, Darvon, Epinephrine, Levofloxacin, Pentazocine lactate, Procaine, Procaine hcl, Propoxyphene, and Septra [sulfamethoxazole w/trimethoprim (co-trimoxazole)]    Social History:  The patient  reports that she quit smoking about 37 years ago. Her smoking use included cigarettes. She has a 3.00  pack-year smoking history. She has never used smokeless tobacco. She reports that she does not drink alcohol and does not use drugs.   Family History:  The patient's family history includes CAD in her sister.    ROS:  Please see the history of present illness.   Otherwise, review of systems are positive for none.   All other systems are reviewed and negative.    PHYSICAL EXAM: VS:  BP 138/78 (BP Location: Left Arm, Patient Position: Sitting, Cuff Size: Normal)   Pulse 73   Ht 5\' 8"  (1.727 m)   Wt 152 lb 4 oz (69.1 kg)   SpO2 96%   BMI 23.15 kg/m  , BMI Body mass index is 23.15 kg/m. GEN: Well nourished, well developed, in no acute distress  HEENT: normal  Neck: no  carotid bruits, or masses.  Significant JVD Cardiac: Irregularly irregular; no rubs, or gallops. There is 1/6 holosystolic murmur at the left sternal border.  There is also a faint diastolic murmur she mild bilateral edema  Respiratory:  clear to auscultation bilaterally, normal work of breathing GI: soft, nontender, nondistended, + BS MS: no deformity  or atrophy  Skin: warm and dry, no rash Neuro:  Strength and sensation are intact Psych: euthymic mood, full affect Right groin is intact with no hematoma.  EKG:  EKG is ordered today. The ekg ordered today demonstrates atrial fibrillation with ventricular rate of 73 bpm.   Recent Labs: 12/03/2020: BUN 17; Creatinine, Ser 0.90; Platelets 150 12/16/2020: Hemoglobin 12.6; Potassium 3.9; Sodium 138    Lipid Panel No results found for: CHOL, TRIG, HDL, CHOLHDL, VLDL, LDLCALC, LDLDIRECT    Wt Readings from Last 3 Encounters:  01/15/21 152 lb 4 oz (69.1 kg)  12/16/20 152 lb (68.9 kg)  12/03/20 163 lb (73.9 kg)       ASSESSMENT AND PLAN:  1. Moderate to severe rheumatic mitral stenosis: This is the likely culprit for the patient's recent worsening of exertional dyspnea in the setting of uncontrolled ventricular rate with exertion which is likely exacerbating degree  of stenosis and pulmonary hypertension.  I am going to see if we can control her atrial fibrillation rate better but we are limited by low resting heart rate.  Ultimately, if there is no significant improvement, I will plan to refer her to [facility] to see Dr. Derinda Sis for potential valvuloplasty and if not feasible mitral valve surgery.  2.  Chronic atrial fibrillation: I elected to increase metoprolol to 50 mg twice daily to see if we can improve her symptoms.  She is on long-term anticoagulation with Xarelto which is off label given mitral stenosis but the patient had a rough time with warfarin in the past.   3.  Chronic diastolic heart failure: I suspect most of her heart failure symptoms are related to mitral stenosis and pulmonary hypertension.  Her LVEDP was normal on recent right heart catheterization and thus we will continue same dose of furosemide 40 mg daily.      Disposition:   Follow-up with me in 1 month.  I will plan on checking her heart rate and oxygen saturation at rest and with exertion at that time.  Signed,  Kathlyn Sacramento, MD  01/15/2021 11:22 AM    [facility]

## 2021-01-15 NOTE — Patient Instructions (Signed)
Medication Instructions:  Your physician has recommended you make the following change in your medication:   INCREASE Metoprolol to 50 mg twice a day. An Rx has been sent to your pharmacy.  *If you need a refill on your cardiac medications before your next appointment, please call your pharmacy*   Lab Work: None ordered  If you have labs (blood work) drawn today and your tests are completely normal, you will receive your results only by: [place] (if you have MyChart) OR A paper copy in the mail If you have any lab test that is abnormal or we need to change your treatment, we will call you to review the results.   Testing/Procedures: None ordered   Follow-Up: At [facility], you and your health needs are our priority.  As part of our continuing mission to provide you with exceptional heart care, we have created designated Provider Care Teams.  These Care Teams include your primary Cardiologist (physician) and Advanced Practice Providers (APPs -  Physician Assistants and Nurse Practitioners) who all work together to provide you with the care you need, when you need it.  We recommend signing up for the patient portal called "MyChart".  Sign up information is provided on this After Visit Summary.  MyChart is used to connect with patients for Virtual Visits (Telemedicine).  Patients are able to view lab/test results, encounter notes, upcoming appointments, etc.  Non-urgent messages can be sent to your provider as well.   To learn more about what you can do with MyChart, go to [email or web address].    Your next appointment:   4 week(s)  The format for your next appointment:   In Person  Provider:   Kathlyn Sacramento, MD only   Other Instructions N/A

## 2021-01-20 ENCOUNTER — Telehealth: Payer: Self-pay | Admitting: Cardiovascular Disease

## 2021-01-20 DIAGNOSIS — I050 Rheumatic mitral stenosis: Secondary | ICD-10-CM

## 2021-01-20 NOTE — Telephone Encounter (Signed)
I spoke with the patient. She was seen by Dr. Fletcher Anon on 01/15/21.   Per Dr. Tyrell Antonio note: 2.  Chronic atrial fibrillation: I elected to increase metoprolol to 50 mg twice daily to see if we can improve her symptoms.  She is on long-term anticoagulation with Xarelto which is off label given mitral stenosis but the patient had a rough time with warfarin in the past   The patient called in stating she feels like she is not tolerating the increased dose of metoprolol tart at 50 mg BID. She advised "I just feel much worse."  I asked her to please describe what she is feeling. Per her report, her heart rate is erratic (going from 92-94 bpm then suddenly dropping to 64-68 bpm). She states her BP is low normal, but this is typical for her and she has not noticed much change since taking the increased metoprolol. She could not give me specific BP readings.  She denies lower extremity swelling/ abdominal fullness/ weight gain.   She does state she felt really bad last night and could not sleep well as she could not lay down without feeling SOB. Her O2 sat last night was 88%. I asked if this was normal for her and she advised, "sometimes it will run lower."  The patient advises "I just feel so much worse on this higher dose of metoprolol."  The patient was thinking about not taking the metoprolol today or just taking it once today. I have advised her that I do not want her higher heart rates to go any higher.  I have advised her to decrease metoprolol tartrate to 25 mg BID. She is aware I will forward this message to Dr. Fletcher Anon for further recommendations regarding her atrial fibrillation. She is aware that he is not physically in this office today, so it may be tomorrow before we call her back.  The patient voices understanding of the above recommendations and is agreeable.

## 2021-01-20 NOTE — Telephone Encounter (Signed)
Pt c/o medication issue:  1. Name of Medication: metoprolol   2. How are you currently taking this medication (dosage and times per day)? 50 MG 1 tablet 2 times daily   3. Are you having a reaction (difficulty breathing--STAT)? Feels "deathly ill"  4. What is your medication issue? Patient calling, was prescribed to take 100 MG of metoprolol last week and has not felt well ever since.  Would like to discuss options.

## 2021-01-20 NOTE — Telephone Encounter (Signed)
Change metoprolol back to 25 mg 3 times daily.  Refer the patient to Dr. Derinda Sis at [facility] for evaluation of mitral valve valvuloplasty.  I already sent him a text about her but have not heard back from them yet.

## 2021-01-21 MED ORDER — METOPROLOL TARTRATE 25 MG PO TABS: 25.0000 mg | ORAL_TABLET | Freq: Three times a day (TID) | ORAL | 2 refills | Status: DC

## 2021-01-21 NOTE — Telephone Encounter (Signed)
Spoke with patient and reviewed provider recommendations. Sent in new prescription for her to pharmacy she confirmed. Referral placed for Dr. Derinda Sis at [facility] and Dr. Fletcher Anon has also sent communication to him. She was appreciative for the call with no further questions at this time.

## 2021-01-26 ENCOUNTER — Other Ambulatory Visit: Payer: Self-pay | Admitting: Cardiovascular Disease

## 2021-02-10 ENCOUNTER — Other Ambulatory Visit: Payer: Self-pay | Admitting: Cardiovascular Disease

## 2021-02-10 DIAGNOSIS — I4821 Permanent atrial fibrillation: Secondary | ICD-10-CM

## 2021-02-10 NOTE — Telephone Encounter (Signed)
Refill Request.  

## 2021-02-10 NOTE — Telephone Encounter (Signed)
Prescription refill request for Xarelto received.  Indication: a fib Last office visit: 01/15/21 Weight: 69kg Age: 75  Scr: 0.9 CrCl: 64 mL/min

## 2021-02-11 ENCOUNTER — Other Ambulatory Visit: Payer: Self-pay

## 2021-02-11 ENCOUNTER — Ambulatory Visit (INDEPENDENT_AMBULATORY_CARE_PROVIDER_SITE_OTHER): Payer: Medicare Other | Admitting: Cardiovascular Disease

## 2021-02-11 ENCOUNTER — Encounter: Payer: Self-pay | Admitting: Cardiovascular Disease

## 2021-02-11 VITALS — BP 110/64 | HR 56 | Ht 68.0 in | Wt 152.0 lb

## 2021-02-11 DIAGNOSIS — I482 Chronic atrial fibrillation, unspecified: Secondary | ICD-10-CM | POA: Diagnosis not present

## 2021-02-11 DIAGNOSIS — I050 Rheumatic mitral stenosis: Secondary | ICD-10-CM | POA: Diagnosis not present

## 2021-02-11 DIAGNOSIS — I5032 Chronic diastolic (congestive) heart failure: Secondary | ICD-10-CM

## 2021-02-11 NOTE — Patient Instructions (Signed)
Medication Instructions:  Your physician recommends that you continue on your current medications as directed. Please refer to the Current Medication list given to you today.  *If you need a refill on your cardiac medications before your next appointment, please call your pharmacy*   Lab Work: None ordered  If you have labs (blood work) drawn today and your tests are completely normal, you will receive your results only by: [place] (if you have MyChart) OR A paper copy in the mail If you have any lab test that is abnormal or we need to change your treatment, we will call you to review the results.   Testing/Procedures: None ordered   Follow-Up: At [facility], you and your health needs are our priority.  As part of our continuing mission to provide you with exceptional heart care, we have created designated Provider Care Teams.  These Care Teams include your primary Cardiologist (physician) and Advanced Practice Providers (APPs -  Physician Assistants and Nurse Practitioners) who all work together to provide you with the care you need, when you need it.  We recommend signing up for the patient portal called "MyChart".  Sign up information is provided on this After Visit Summary.  MyChart is used to connect with patients for Virtual Visits (Telemedicine).  Patients are able to view lab/test results, encounter notes, upcoming appointments, etc.  Non-urgent messages can be sent to your provider as well.   To learn more about what you can do with MyChart, go to [email or web address].    Your next appointment:   3 month(s)  The format for your next appointment:   In Person  Provider:   Kathlyn Sacramento, MD   Other Instructions N/A
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
## 2021-02-24 DIAGNOSIS — Z20828 Contact with and (suspected) exposure to other viral communicable diseases: Secondary | ICD-10-CM | POA: Diagnosis not present

## 2021-03-02 DIAGNOSIS — Z20828 Contact with and (suspected) exposure to other viral communicable diseases: Secondary | ICD-10-CM | POA: Diagnosis not present

## 2021-03-02 DIAGNOSIS — R059 Cough, unspecified: Secondary | ICD-10-CM | POA: Diagnosis not present

## 2021-03-09 ENCOUNTER — Ambulatory Visit: Payer: Medicare Other | Admitting: Urology

## 2021-03-30 DIAGNOSIS — H353231 Exudative age-related macular degeneration, bilateral, with active choroidal neovascularization: Secondary | ICD-10-CM | POA: Diagnosis not present

## 2021-03-31 DIAGNOSIS — S0501XA Injury of conjunctiva and corneal abrasion without foreign body, right eye, initial encounter: Secondary | ICD-10-CM | POA: Diagnosis not present

## 2021-04-09 DIAGNOSIS — I050 Rheumatic mitral stenosis: Secondary | ICD-10-CM | POA: Diagnosis not present

## 2021-04-09 DIAGNOSIS — I4819 Other persistent atrial fibrillation: Secondary | ICD-10-CM | POA: Diagnosis not present

## 2021-04-09 DIAGNOSIS — I071 Rheumatic tricuspid insufficiency: Secondary | ICD-10-CM | POA: Diagnosis not present

## 2021-05-03 DIAGNOSIS — I050 Rheumatic mitral stenosis: Secondary | ICD-10-CM | POA: Diagnosis not present

## 2021-05-03 DIAGNOSIS — I4821 Permanent atrial fibrillation: Secondary | ICD-10-CM | POA: Diagnosis not present

## 2021-05-03 DIAGNOSIS — Z7901 Long term (current) use of anticoagulants: Secondary | ICD-10-CM | POA: Diagnosis not present

## 2021-05-03 DIAGNOSIS — Z853 Personal history of malignant neoplasm of breast: Secondary | ICD-10-CM | POA: Diagnosis not present

## 2021-05-03 DIAGNOSIS — I083 Combined rheumatic disorders of mitral, aortic and tricuspid valves: Secondary | ICD-10-CM | POA: Diagnosis not present

## 2021-05-03 DIAGNOSIS — Z9011 Acquired absence of right breast and nipple: Secondary | ICD-10-CM | POA: Diagnosis not present

## 2021-05-03 DIAGNOSIS — Z87891 Personal history of nicotine dependence: Secondary | ICD-10-CM | POA: Diagnosis not present

## 2021-05-09 ENCOUNTER — Other Ambulatory Visit: Payer: Self-pay | Admitting: Cardiovascular Disease

## 2021-05-11 ENCOUNTER — Ambulatory Visit: Payer: Medicare Other | Admitting: Urology

## 2021-05-21 DIAGNOSIS — Z6823 Body mass index (BMI) 23.0-23.9, adult: Secondary | ICD-10-CM | POA: Diagnosis not present

## 2021-05-21 DIAGNOSIS — R591 Generalized enlarged lymph nodes: Secondary | ICD-10-CM | POA: Diagnosis not present

## 2021-05-24 DIAGNOSIS — M25462 Effusion, left knee: Secondary | ICD-10-CM | POA: Diagnosis not present

## 2021-05-24 DIAGNOSIS — M1712 Unilateral primary osteoarthritis, left knee: Secondary | ICD-10-CM | POA: Diagnosis not present

## 2021-05-24 DIAGNOSIS — M1120 Other chondrocalcinosis, unspecified site: Secondary | ICD-10-CM | POA: Diagnosis not present

## 2021-05-27 ENCOUNTER — Other Ambulatory Visit: Payer: Self-pay | Admitting: Cardiovascular Disease

## 2021-05-27 NOTE — Telephone Encounter (Signed)
Patient states she is having surgery and refusing to schedule - told her to contact PCP for medication if needed

## 2021-05-27 NOTE — Telephone Encounter (Signed)
Please contact pt for future appointment. Pt due for 3 month f/u. 

## 2021-05-28 DIAGNOSIS — Z853 Personal history of malignant neoplasm of breast: Secondary | ICD-10-CM | POA: Diagnosis not present

## 2021-05-28 DIAGNOSIS — R590 Localized enlarged lymph nodes: Secondary | ICD-10-CM | POA: Diagnosis not present

## 2021-05-28 DIAGNOSIS — E042 Nontoxic multinodular goiter: Secondary | ICD-10-CM | POA: Diagnosis not present

## 2021-05-28 DIAGNOSIS — R591 Generalized enlarged lymph nodes: Secondary | ICD-10-CM | POA: Diagnosis not present

## 2021-05-28 NOTE — Telephone Encounter (Signed)
Please advise if ok to refill medication. Pt is having upcoming surgery and doesn't want to schedule her 3 month f/u at this time.

## 2021-05-31 DIAGNOSIS — R410 Disorientation, unspecified: Secondary | ICD-10-CM | POA: Diagnosis not present

## 2021-05-31 DIAGNOSIS — Z880 Allergy status to penicillin: Secondary | ICD-10-CM | POA: Diagnosis not present

## 2021-05-31 DIAGNOSIS — S199XXA Unspecified injury of neck, initial encounter: Secondary | ICD-10-CM | POA: Diagnosis not present

## 2021-05-31 DIAGNOSIS — Z884 Allergy status to anesthetic agent status: Secondary | ICD-10-CM | POA: Diagnosis not present

## 2021-05-31 DIAGNOSIS — R000 Tachycardia, unspecified: Secondary | ICD-10-CM | POA: Diagnosis not present

## 2021-05-31 DIAGNOSIS — M25561 Pain in right knee: Secondary | ICD-10-CM | POA: Diagnosis not present

## 2021-05-31 DIAGNOSIS — Z882 Allergy status to sulfonamides status: Secondary | ICD-10-CM | POA: Diagnosis not present

## 2021-05-31 DIAGNOSIS — S0101XA Laceration without foreign body of scalp, initial encounter: Secondary | ICD-10-CM | POA: Diagnosis not present

## 2021-05-31 DIAGNOSIS — R404 Transient alteration of awareness: Secondary | ICD-10-CM | POA: Diagnosis not present

## 2021-05-31 DIAGNOSIS — R58 Hemorrhage, not elsewhere classified: Secondary | ICD-10-CM | POA: Diagnosis not present

## 2021-05-31 DIAGNOSIS — S8001XA Contusion of right knee, initial encounter: Secondary | ICD-10-CM | POA: Diagnosis not present

## 2021-05-31 DIAGNOSIS — S299XXA Unspecified injury of thorax, initial encounter: Secondary | ICD-10-CM | POA: Diagnosis not present

## 2021-05-31 DIAGNOSIS — I4891 Unspecified atrial fibrillation: Secondary | ICD-10-CM | POA: Diagnosis not present

## 2021-05-31 DIAGNOSIS — Z888 Allergy status to other drugs, medicaments and biological substances status: Secondary | ICD-10-CM | POA: Diagnosis not present

## 2021-05-31 DIAGNOSIS — S0990XA Unspecified injury of head, initial encounter: Secondary | ICD-10-CM | POA: Diagnosis not present

## 2021-05-31 DIAGNOSIS — M25562 Pain in left knee: Secondary | ICD-10-CM | POA: Diagnosis not present

## 2021-05-31 DIAGNOSIS — S134XXA Sprain of ligaments of cervical spine, initial encounter: Secondary | ICD-10-CM | POA: Diagnosis not present

## 2021-05-31 DIAGNOSIS — S8002XA Contusion of left knee, initial encounter: Secondary | ICD-10-CM | POA: Diagnosis not present

## 2021-05-31 DIAGNOSIS — Z881 Allergy status to other antibiotic agents status: Secondary | ICD-10-CM | POA: Diagnosis not present

## 2021-06-01 ENCOUNTER — Telehealth: Payer: Self-pay | Admitting: Cardiovascular Disease
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
## 2021-06-01 NOTE — Telephone Encounter (Signed)
I agree that she should hold Xarelto today and can resume tomorrow if things seem stable.
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
## 2021-06-03 ENCOUNTER — Ambulatory Visit: Payer: 59 | Admitting: Cardiovascular Disease
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
## 2021-06-09 DIAGNOSIS — S1093XA Contusion of unspecified part of neck, initial encounter: Secondary | ICD-10-CM | POA: Diagnosis not present

## 2021-06-09 DIAGNOSIS — R109 Unspecified abdominal pain: Secondary | ICD-10-CM | POA: Diagnosis not present

## 2021-06-09 DIAGNOSIS — S0093XA Contusion of unspecified part of head, initial encounter: Secondary | ICD-10-CM | POA: Diagnosis not present

## 2021-06-09 DIAGNOSIS — R55 Syncope and collapse: Secondary | ICD-10-CM | POA: Diagnosis not present

## 2021-06-09 DIAGNOSIS — S8000XA Contusion of unspecified knee, initial encounter: Secondary | ICD-10-CM | POA: Diagnosis not present

## 2021-06-09 DIAGNOSIS — R634 Abnormal weight loss: Secondary | ICD-10-CM | POA: Diagnosis not present

## 2021-06-09 DIAGNOSIS — R9389 Abnormal findings on diagnostic imaging of other specified body structures: Secondary | ICD-10-CM | POA: Diagnosis not present

## 2021-06-09 DIAGNOSIS — R590 Localized enlarged lymph nodes: Secondary | ICD-10-CM | POA: Diagnosis not present

## 2021-06-23 DIAGNOSIS — Z9882 Breast implant status: Secondary | ICD-10-CM | POA: Diagnosis not present

## 2021-06-23 DIAGNOSIS — R634 Abnormal weight loss: Secondary | ICD-10-CM | POA: Diagnosis not present

## 2021-06-23 DIAGNOSIS — R590 Localized enlarged lymph nodes: Secondary | ICD-10-CM | POA: Diagnosis not present

## 2021-06-23 DIAGNOSIS — Z853 Personal history of malignant neoplasm of breast: Secondary | ICD-10-CM | POA: Diagnosis not present

## 2021-06-23 DIAGNOSIS — R109 Unspecified abdominal pain: Secondary | ICD-10-CM | POA: Diagnosis not present

## 2021-06-23 DIAGNOSIS — Z87828 Personal history of other (healed) physical injury and trauma: Secondary | ICD-10-CM | POA: Diagnosis not present

## 2021-06-25 DIAGNOSIS — I5032 Chronic diastolic (congestive) heart failure: Secondary | ICD-10-CM | POA: Diagnosis not present

## 2021-06-25 DIAGNOSIS — C8258 Diffuse follicle center lymphoma, lymph nodes of multiple sites: Secondary | ICD-10-CM | POA: Diagnosis not present

## 2021-06-25 DIAGNOSIS — R978 Other abnormal tumor markers: Secondary | ICD-10-CM | POA: Diagnosis not present

## 2021-06-25 DIAGNOSIS — R591 Generalized enlarged lymph nodes: Secondary | ICD-10-CM | POA: Diagnosis not present

## 2021-06-25 DIAGNOSIS — K625 Hemorrhage of anus and rectum: Secondary | ICD-10-CM | POA: Diagnosis not present

## 2021-06-25 DIAGNOSIS — R110 Nausea: Secondary | ICD-10-CM | POA: Diagnosis not present

## 2021-06-25 DIAGNOSIS — R590 Localized enlarged lymph nodes: Secondary | ICD-10-CM | POA: Diagnosis not present

## 2021-06-28 DIAGNOSIS — R599 Enlarged lymph nodes, unspecified: Secondary | ICD-10-CM | POA: Diagnosis not present

## 2021-06-28 DIAGNOSIS — R42 Dizziness and giddiness: Secondary | ICD-10-CM | POA: Diagnosis not present

## 2021-07-05 DIAGNOSIS — Z952 Presence of prosthetic heart valve: Secondary | ICD-10-CM | POA: Diagnosis not present

## 2021-07-05 DIAGNOSIS — R936 Abnormal findings on diagnostic imaging of limbs: Secondary | ICD-10-CM | POA: Diagnosis not present

## 2021-07-05 DIAGNOSIS — M170 Bilateral primary osteoarthritis of knee: Secondary | ICD-10-CM | POA: Diagnosis not present

## 2021-07-05 DIAGNOSIS — C50919 Malignant neoplasm of unspecified site of unspecified female breast: Secondary | ICD-10-CM | POA: Diagnosis not present

## 2021-07-05 DIAGNOSIS — M19011 Primary osteoarthritis, right shoulder: Secondary | ICD-10-CM | POA: Diagnosis not present

## 2021-07-06 DIAGNOSIS — C50919 Malignant neoplasm of unspecified site of unspecified female breast: Secondary | ICD-10-CM | POA: Diagnosis not present

## 2021-07-06 DIAGNOSIS — R109 Unspecified abdominal pain: Secondary | ICD-10-CM | POA: Diagnosis not present

## 2021-07-06 DIAGNOSIS — R634 Abnormal weight loss: Secondary | ICD-10-CM | POA: Diagnosis not present

## 2021-07-09 DIAGNOSIS — R634 Abnormal weight loss: Secondary | ICD-10-CM | POA: Diagnosis not present

## 2021-07-09 DIAGNOSIS — R109 Unspecified abdominal pain: Secondary | ICD-10-CM | POA: Diagnosis not present

## 2021-07-20 DIAGNOSIS — H353231 Exudative age-related macular degeneration, bilateral, with active choroidal neovascularization: Secondary | ICD-10-CM | POA: Diagnosis not present

## 2021-07-22 DIAGNOSIS — R319 Hematuria, unspecified: Secondary | ICD-10-CM | POA: Diagnosis not present

## 2021-07-22 DIAGNOSIS — Z6824 Body mass index (BMI) 24.0-24.9, adult: Secondary | ICD-10-CM | POA: Diagnosis not present

## 2021-07-27 DIAGNOSIS — R935 Abnormal findings on diagnostic imaging of other abdominal regions, including retroperitoneum: Secondary | ICD-10-CM | POA: Diagnosis not present

## 2021-07-27 DIAGNOSIS — R634 Abnormal weight loss: Secondary | ICD-10-CM | POA: Diagnosis not present

## 2021-07-27 DIAGNOSIS — R590 Localized enlarged lymph nodes: Secondary | ICD-10-CM | POA: Diagnosis not present

## 2021-08-03 ENCOUNTER — Other Ambulatory Visit: Payer: Self-pay | Admitting: Cardiovascular Disease

## 2021-08-03 DIAGNOSIS — I4821 Permanent atrial fibrillation: Secondary | ICD-10-CM

## 2021-08-03 NOTE — Telephone Encounter (Signed)
Refill request

## 2021-08-03 NOTE — Telephone Encounter (Signed)
Prescription refill request for Xarelto received.  Indication: Atrial fib Last office visit: 02/11/21  Rod Can MD Weight: 68.9kg Age: 75 Scr: 0.81 on 06/25/21 CrCl: 63.26  Based on above findings Xarelto 20mg  daily is the appropriate dose.  Refill approved.

## 2021-08-05 ENCOUNTER — Other Ambulatory Visit: Payer: Self-pay | Admitting: Cardiovascular Disease

## 2021-08-05 ENCOUNTER — Telehealth: Payer: Self-pay | Admitting: Cardiovascular Disease
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
## 2021-08-05 NOTE — Telephone Encounter (Signed)
Patient daughter calling Wants to update office about all that has been going on with patient Would like for [place] to send in refills Offered virtual visit but would like to speak with nurse first  Please call

## 2021-08-05 NOTE — Telephone Encounter (Signed)
-----   Message from Anselm Pancoast, [place] sent at 08/05/2021  1:42 PM EST ----- Please contact patient for a follow up appointment with Dr. Fletcher Anon.  Thanks, Ivin Booty
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
## 2021-08-24 ENCOUNTER — Other Ambulatory Visit: Payer: Self-pay | Admitting: Cardiovascular Disease

## 2021-08-26 ENCOUNTER — Other Ambulatory Visit (HOSPITAL_COMMUNITY): Payer: Self-pay | Admitting: Oncology

## 2021-08-31 ENCOUNTER — Other Ambulatory Visit (HOSPITAL_COMMUNITY): Payer: Self-pay | Admitting: Oncology

## 2021-08-31 DIAGNOSIS — R590 Localized enlarged lymph nodes: Secondary | ICD-10-CM

## 2021-09-02 ENCOUNTER — Ambulatory Visit (HOSPITAL_COMMUNITY)
Admission: RE | Admit: 2021-09-02 | Discharge: 2021-09-02 | Disposition: A | Discharge status: Home / Self Care | Payer: Medicare Other | Source: Ambulatory Visit | Attending: Oncology | Admitting: Oncology

## 2021-09-02 ENCOUNTER — Other Ambulatory Visit: Payer: Self-pay

## 2021-09-02 DIAGNOSIS — N200 Calculus of kidney: Secondary | ICD-10-CM | POA: Diagnosis not present

## 2021-09-02 DIAGNOSIS — J90 Pleural effusion, not elsewhere classified: Secondary | ICD-10-CM | POA: Diagnosis not present

## 2021-09-02 DIAGNOSIS — C8590 Non-Hodgkin lymphoma, unspecified, unspecified site: Secondary | ICD-10-CM | POA: Diagnosis not present

## 2021-09-02 DIAGNOSIS — R590 Localized enlarged lymph nodes: Secondary | ICD-10-CM

## 2021-09-02 DIAGNOSIS — J8410 Pulmonary fibrosis, unspecified: Secondary | ICD-10-CM | POA: Diagnosis not present

## 2021-09-02 MED ORDER — FLUDEOXYGLUCOSE F - 18 (FDG) INJECTION
7.9600 | Freq: Once | INTRAVENOUS | Status: AC | PRN
  Administered 2021-09-02: 7.96 via INTRAVENOUS

## 2021-09-06 DIAGNOSIS — I868 Varicose veins of other specified sites: Secondary | ICD-10-CM | POA: Diagnosis not present

## 2021-09-06 DIAGNOSIS — Z6822 Body mass index (BMI) 22.0-22.9, adult: Secondary | ICD-10-CM | POA: Diagnosis not present

## 2021-09-06 DIAGNOSIS — K3189 Other diseases of stomach and duodenum: Secondary | ICD-10-CM | POA: Diagnosis not present

## 2021-09-06 DIAGNOSIS — K6389 Other specified diseases of intestine: Secondary | ICD-10-CM | POA: Diagnosis not present

## 2021-09-06 DIAGNOSIS — R634 Abnormal weight loss: Secondary | ICD-10-CM | POA: Diagnosis not present

## 2021-09-06 DIAGNOSIS — K5520 Angiodysplasia of colon without hemorrhage: Secondary | ICD-10-CM | POA: Diagnosis not present

## 2021-09-06 DIAGNOSIS — R109 Unspecified abdominal pain: Secondary | ICD-10-CM | POA: Diagnosis not present

## 2021-09-06 DIAGNOSIS — K2970 Gastritis, unspecified, without bleeding: Secondary | ICD-10-CM | POA: Diagnosis not present

## 2021-09-08 ENCOUNTER — Other Ambulatory Visit: Payer: Self-pay

## 2021-09-08 DIAGNOSIS — I4821 Permanent atrial fibrillation: Secondary | ICD-10-CM

## 2021-09-08 MED ORDER — RIVAROXABAN 20 MG PO TABS: 20.0000 mg | ORAL_TABLET | Freq: Every day | ORAL | 1 refills | Status: AC

## 2021-09-08 NOTE — Telephone Encounter (Signed)
Xarelto 20 mg refill request received. Pt is 76 years old, weight- 68.9 kg, Crea- 0.81 on 06/25/21, last seen by Dr. Fletcher Anon on 02/11/21, Diagnosis- afib, CrCl- 62.26; Dose is appropriate based on dosing criteria. Will send in refill to requested pharmacy.

## 2021-09-17 DIAGNOSIS — Z853 Personal history of malignant neoplasm of breast: Secondary | ICD-10-CM | POA: Diagnosis not present

## 2021-09-17 DIAGNOSIS — Z7901 Long term (current) use of anticoagulants: Secondary | ICD-10-CM | POA: Diagnosis not present

## 2021-09-17 DIAGNOSIS — D649 Anemia, unspecified: Secondary | ICD-10-CM | POA: Diagnosis not present

## 2021-09-17 DIAGNOSIS — Z5189 Encounter for other specified aftercare: Secondary | ICD-10-CM | POA: Diagnosis not present

## 2021-09-17 DIAGNOSIS — R634 Abnormal weight loss: Secondary | ICD-10-CM | POA: Diagnosis not present

## 2021-09-17 DIAGNOSIS — K625 Hemorrhage of anus and rectum: Secondary | ICD-10-CM | POA: Diagnosis not present

## 2021-09-17 DIAGNOSIS — Z9010 Acquired absence of unspecified breast and nipple: Secondary | ICD-10-CM | POA: Diagnosis not present

## 2021-09-17 DIAGNOSIS — R590 Localized enlarged lymph nodes: Secondary | ICD-10-CM | POA: Diagnosis not present

## 2021-09-28 DIAGNOSIS — D649 Anemia, unspecified: Secondary | ICD-10-CM | POA: Diagnosis not present

## 2021-09-28 DIAGNOSIS — Z0389 Encounter for observation for other suspected diseases and conditions ruled out: Secondary | ICD-10-CM | POA: Diagnosis not present

## 2021-09-28 DIAGNOSIS — R590 Localized enlarged lymph nodes: Secondary | ICD-10-CM | POA: Diagnosis not present

## 2021-09-28 DIAGNOSIS — Z853 Personal history of malignant neoplasm of breast: Secondary | ICD-10-CM | POA: Diagnosis not present

## 2021-09-28 DIAGNOSIS — Z5189 Encounter for other specified aftercare: Secondary | ICD-10-CM | POA: Diagnosis not present

## 2021-09-28 DIAGNOSIS — R634 Abnormal weight loss: Secondary | ICD-10-CM | POA: Diagnosis not present

## 2021-10-11 DIAGNOSIS — I050 Rheumatic mitral stenosis: Secondary | ICD-10-CM | POA: Diagnosis not present

## 2021-10-21 ENCOUNTER — Encounter: Payer: Self-pay | Admitting: Gastroenterology

## 2021-11-05 ENCOUNTER — Ambulatory Visit: Payer: Medicare Other | Admitting: Gastroenterology

## 2021-11-05 DIAGNOSIS — H1013 Acute atopic conjunctivitis, bilateral: Secondary | ICD-10-CM | POA: Diagnosis not present

## 2021-11-05 DIAGNOSIS — H353231 Exudative age-related macular degeneration, bilateral, with active choroidal neovascularization: Secondary | ICD-10-CM | POA: Diagnosis not present

## 2021-11-08 ENCOUNTER — Ambulatory Visit (INDEPENDENT_AMBULATORY_CARE_PROVIDER_SITE_OTHER): Payer: Medicare Other | Admitting: Urology

## 2021-11-08 ENCOUNTER — Encounter: Payer: Self-pay | Admitting: Urology

## 2021-11-08 ENCOUNTER — Telehealth: Payer: Self-pay | Admitting: Cardiovascular Disease

## 2021-11-08 ENCOUNTER — Other Ambulatory Visit: Payer: Self-pay | Admitting: Cardiovascular Disease

## 2021-11-08 VITALS — BP 102/66 | HR 83

## 2021-11-08 DIAGNOSIS — R310 Gross hematuria: Secondary | ICD-10-CM

## 2021-11-08 DIAGNOSIS — R32 Unspecified urinary incontinence: Secondary | ICD-10-CM | POA: Diagnosis not present

## 2021-11-08 LAB — URINALYSIS, ROUTINE W REFLEX MICROSCOPIC
Bilirubin, UA: POSITIVE — AB
Glucose, UA: NEGATIVE
Ketones, UA: NEGATIVE
Leukocytes,UA: NEGATIVE
Nitrite, UA: NEGATIVE
Specific Gravity, UA: >1.03 — ABNORMAL HIGH (ref 1.005–1.030)
Urobilinogen, Ur: 4 mg/dL — ABNORMAL HIGH (ref 0.2–1.0)
pH, UA: 6.5 (ref 5.0–7.5)

## 2021-11-08 LAB — MICROSCOPIC EXAMINATION
RBC, Urine: >30 /hpf — AB (ref 0–2)
WBC, UA: NONE SEEN /hpf (ref 0–5)

## 2021-11-08 NOTE — Telephone Encounter (Signed)
Pt overdue for 3 month fu. ?Please contact pt for future appointment. ?Pt needing refills. ?

## 2021-11-08 NOTE — Progress Notes (Signed)
post void residual=0 ?

## 2021-11-08 NOTE — Progress Notes (Signed)
? ?11/08/2021 ?10:16 AM  ? ?Taylor Weber ?06-Jul-1946 ?[id number] ? ?Referring provider: Rosalee Kaufman, PA-C ?[address] ?[place],  [place] [address] ? ?No chief complaint on file. ? ? ?HPI: ? ?New patient- ? ?1) gross hematuria-patient noticed red urine on and off since Jan 2023. No clots. She underwent a PET CT scan January 2023 in the evaluation of possible lymphoma which was benign.  She had a 3 mm right lower pole stone and bilateral renal cysts.  UA today with 30 red blood cells per high-powered. Her hgb was 13.16 Sep 2021 and Cr 0.15 Apr 2021.  ? ?Smoked for about 25 yrs but quit by age 24. No chemo. No work exposure. No GU surgery. TAHx around age 83.  ? ?She voids with a good stream. Takes 40 mg IV lasix daily. She has frequency, urgency and UUI. Uses 1 ppd and typically dry.  ? ?She is on xarelto for afib. She has MVP and TR with plan for mitral valve replacement, tricuspid valve repair, ligation of left atrial appendage on November 23, 2021 with Dr. Cheree Ditto at [facility]. She will be admitted to the hospital on November 22, 2021. Preop testing will be done at that time. ? ? ? ?PMH: ?Past Medical History:  ?Diagnosis Date  ? Atherosclerotic cerebrovascular disease   ? nonobstructive  ? Breast cancer ([place])   ? remote right breast mastectomy,bilaterial implants  ? Breast cancer ([place])   ? radical mastectomy status post billaterial breast implants and right breast implat rupture  ? Carotid arterial disease ([place])   ? Carotid bruit   ? right   ? Chest pain   ? normal coronary angiogram in december 2008  ? Chronic diastolic congestive heart failure ([place])   ? Diabetes mellitus   ? diet controlled  ? Dyspnea   ? chronic exertional dyspnea related to intermittant atrial  ? Mild aortic regurgitation with left ventricular dilation by prior echocardiogram   ? Mitral stenosis   ? moderate  ? Persistent atrial fibrillation ([place])   ? post cardioversion maintain normal sinus rhythm  ? Pneumonia   ? 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 Moderate  ? Rheumatic fever   ? as a child  ? Rheumatic mitral stenosis   ? Silicone leakage from breast implant   ? Tricuspid regurgitation   ? Vertigo   ? ? ?Surgical History: ?Past Surgical History:  ?Procedure Laterality Date  ? APPENDECTOMY    ? CARDIAC CATHETERIZATION  2008  ? no significant CAD  ? CARDIAC CATHETERIZATION  04/2011  ? No significant CAD, moderate mitral stenosis (mean gradient: 12 mm Hg, MVA: 1.83), moderate pulmonary hypertension (PVR: 2.4 Woods units), Normal LVEDP.   ? CARDIAC CATHETERIZATION N/A 04/15/2015  ? Procedure: Right/Left Heart Cath and Coronary Angiography;  Surgeon: Wellington Hampshire, MD;  Location: [place] [facility];  Service: Cardiovascular;  Laterality: N/A;  ? CATARACT EXTRACTION Right   ? CHOLECYSTECTOMY    ? ELECTROPHYSIOLOGIC STUDY N/A 03/02/2015  ? Procedure: Cardioversion;  Surgeon: Wellington Hampshire, MD;  Location: [facility];  Service: Cardiovascular;  Laterality: N/A;  ? MASTECTOMY Right   ? PLACEMENT OF BREAST IMPLANTS    ? s/p mastectomy  ? RIGHT/LEFT HEART CATH AND CORONARY ANGIOGRAPHY N/A 12/16/2020  ? Procedure: RIGHT/LEFT HEART CATH AND CORONARY ANGIOGRAPHY;  Surgeon: Wellington Hampshire, MD;  Location: [place] [facility];  Service: Cardiovascular;  Laterality: N/A;  ? TEE WITHOUT CARDIOVERSION N/A 04/15/2015  ?  Procedure: TRANSESOPHAGEAL ECHOCARDIOGRAM (TEE);  Surgeon: Dorothy Spark, MD;  Location: [place];  Service: Cardiovascular;  Laterality: N/A;  ? TOTAL ABDOMINAL HYSTERECTOMY    ? ? ?Home Medications:  ?Allergies as of 11/08/2021   ? ?   Reactions  ? Amoxicillin-pot Clavulanate Shortness Of Breath, Other (See Comments)  ? AUGMENTIN  ?Other Reaction: GI Upset  ? Lidocaine Shortness Of Breath, Palpitations  ? Other Swelling  ? Throat swelling  ? Cefuroxime Axetil Rash  ? Nitrofuran Derivatives Rash, Other (See Comments)  ? (generic for Macrobid) Rash  ? Pentazocine Rash, Palpitations  ? Sulfa Antibiotics Rash  ? Methotrexate Derivatives    ? Sob  ? Amiodarone Other (See Comments)  ? Alopecia  ? Clodronic Acid Rash  ? Darvon Rash  ? Epinephrine Palpitations, Other (See Comments)  ? Pt is unsure of this reaction ?Pt is unsure of this reaction ?Pt is unsure of this reaction  ? Levofloxacin Rash  ? Pentazocine Lactate Rash  ? Procaine Palpitations  ? Procaine Hcl Rash  ? Propoxyphene Palpitations  ? Septra [sulfamethoxazole W/trimethoprim (co-trimoxazole)] Rash  ? ?  ? ?  ?Medication List  ?  ? ?  ? Accurate as of November 08, 2021 10:16 AM. If you have any questions, ask your nurse or doctor.  ?  ?  ? ?  ? ?brimonidine 0.2 % ophthalmic solution ?Commonly known as: ALPHAGAN ?Place 1 drop into the left eye in the morning, at noon, and at bedtime. ?  ?digoxin 0.125 MG tablet ?Commonly known as: LANOXIN ?Take 1 tablet (125 mcg total) by mouth daily. PLEASE CALL OFFICE TO SCHEDULE AN APPOINTMENT FOR FURTHER REFILLS. ?  ?erythromycin ophthalmic ointment ?SMARTSIG:In Eye(s) ?  ?furosemide 40 MG tablet ?Commonly known as: LASIX ?TAKE 1 TABLET BY MOUTH EVERY DAY ?  ?metoprolol tartrate 25 MG tablet ?Commonly known as: LOPRESSOR ?Take 1 tablet (25 mg total) by mouth 3 (three) times daily. PLEASE CALL OFFICE TO SCHEDULE AN APPOINTMENT FOR FURTHER REFILLS. ?  ?rivaroxaban 20 MG Tabs tablet ?Commonly known as: Xarelto ?Take 1 tablet (20 mg total) by mouth daily. ?  ? ?  ? ? ?Allergies:  ?Allergies  ?Allergen Reactions  ? Amoxicillin-Pot Clavulanate Shortness Of Breath and Other (See Comments)  ?  AUGMENTIN  ?Other Reaction: GI Upset  ? Lidocaine Shortness Of Breath and Palpitations  ? Other Swelling  ?  Throat swelling  ? Cefuroxime Axetil Rash  ? Nitrofuran Derivatives Rash and Other (See Comments)  ?  (generic for Macrobid) Rash  ? Pentazocine Rash and Palpitations  ? Sulfa Antibiotics Rash  ? Methotrexate Derivatives   ?  Sob  ? Amiodarone Other (See Comments)  ?  Alopecia  ? Clodronic Acid Rash  ? Darvon Rash  ? Epinephrine Palpitations and Other (See Comments)  ?   Pt is unsure of this reaction ?Pt is unsure of this reaction ?Pt is unsure of this reaction  ? Levofloxacin Rash  ? Pentazocine Lactate Rash  ? Procaine Palpitations  ? Procaine Hcl Rash  ? Propoxyphene Palpitations  ? Septra [Sulfamethoxazole W/Trimethoprim (Co-Trimoxazole)] Rash  ? ? ?Family History: ?Family History  ?Problem Relation Age of Onset  ? CAD Sister   ? ? ?Social History:  reports that she quit smoking about 38 years ago. Her smoking use included cigarettes. She has a 3.00 pack-year smoking history. She has never used smokeless tobacco. She reports that she does not drink alcohol and does not use drugs. ? ? ?Physical Exam: ?BP  102/66   Pulse 83   ?Constitutional:  Alert and oriented, No acute distress. ?HEENT: [place] AT, moist mucus membranes.  Trachea midline, no masses. ?Cardiovascular: No clubbing, cyanosis, or edema. ?Respiratory: Normal respiratory effort, no increased work of breathing. ?GI: Abdomen is soft, nontender, nondistended, no abdominal masses ?GU: No CVA tenderness ?Skin: No rashes, bruises or suspicious lesions. ?Neurologic: Grossly intact, no focal deficits, moving all 4 extremities. ?Psychiatric: Normal mood and affect. ? ? ?Laboratory Data: ?Lab Results  ?Component Value Date  ? WBC 6.3 12/03/2020  ? HGB 12.6 12/16/2020  ? HCT 37.0 12/16/2020  ? MCV 86 12/03/2020  ? PLT 150 12/03/2020  ? ? ?Lab Results  ?Component Value Date  ? CREATININE 0.90 12/03/2020  ? ? ?No results found for: PSA ? ?No results found for: TESTOSTERONE ? ?No results found for: HGBA1C ? ?Urinalysis ?   ?Component Value Date/Time  ? COLORURINE YELLOW 01/14/2017 1029  ? APPEARANCEUR HAZY (A) 01/14/2017 1029  ? LABSPEC 1.031 (H) 01/14/2017 1029  ? PHURINE 5.0 01/14/2017 1029  ? GLUCOSEU NEGATIVE 01/14/2017 1029  ? HGBUR SMALL (A) 01/14/2017 1029  ? [place] NEGATIVE 01/14/2017 1029  ? [place] NEGATIVE 01/14/2017 1029  ? PROTEINUR 30 (A) 01/14/2017 1029  ? NITRITE NEGATIVE 01/14/2017 1029  ? LEUKOCYTESUR NEGATIVE  01/14/2017 1029  ? ? ?No results found for: LABMICR, [place], RBCUA, LABEPIT, MUCUS, BACTERIA ? ?Pertinent Imaging: ?Pet ct Jan 2023 - images reviewed  ?Dr. Aundra Millet notes, labs  ? ? ?Assessment & Plan:

## 2021-11-08 NOTE — Telephone Encounter (Signed)
Spoke with patient to schedule ROV ? ?Fyi - per patient request let Fletcher Anon know she is  having open heart procedure at [facility] this month .  ?

## 2021-11-09 DIAGNOSIS — D472 Monoclonal gammopathy: Secondary | ICD-10-CM | POA: Diagnosis not present

## 2021-11-09 DIAGNOSIS — I059 Rheumatic mitral valve disease, unspecified: Secondary | ICD-10-CM | POA: Diagnosis not present

## 2021-11-09 DIAGNOSIS — D649 Anemia, unspecified: Secondary | ICD-10-CM | POA: Diagnosis not present

## 2021-11-09 DIAGNOSIS — R319 Hematuria, unspecified: Secondary | ICD-10-CM | POA: Diagnosis not present

## 2021-11-09 DIAGNOSIS — R591 Generalized enlarged lymph nodes: Secondary | ICD-10-CM | POA: Diagnosis not present

## 2021-11-10 ENCOUNTER — Other Ambulatory Visit: Payer: Self-pay

## 2021-11-10 LAB — URINE CULTURE

## 2021-11-11 ENCOUNTER — Ambulatory Visit (INDEPENDENT_AMBULATORY_CARE_PROVIDER_SITE_OTHER): Payer: Medicare Other | Admitting: Urology

## 2021-11-11 ENCOUNTER — Encounter: Payer: Self-pay | Admitting: Urology

## 2021-11-11 VITALS — BP 101/67 | HR 85

## 2021-11-11 DIAGNOSIS — R310 Gross hematuria: Secondary | ICD-10-CM

## 2021-11-11 LAB — URINALYSIS, ROUTINE W REFLEX MICROSCOPIC
Bilirubin, UA: NEGATIVE
Glucose, UA: NEGATIVE
Ketones, UA: NEGATIVE
Leukocytes,UA: NEGATIVE
Nitrite, UA: NEGATIVE
Specific Gravity, UA: >1.03 — ABNORMAL HIGH (ref 1.005–1.030)
Urobilinogen, Ur: 4 mg/dL — ABNORMAL HIGH (ref 0.2–1.0)
pH, UA: 6 (ref 5.0–7.5)

## 2021-11-11 LAB — MICROSCOPIC EXAMINATION
Bacteria, UA: NONE SEEN
RBC, Urine: >30 /hpf — AB (ref 0–2)
Renal Epithel, UA: NONE SEEN /hpf

## 2021-11-11 MED ORDER — DOXYCYCLINE HYCLATE 100 MG PO CAPS: 100.0000 mg | ORAL_CAPSULE | Freq: Two times a day (BID) | ORAL | 0 refills | Status: AC

## 2021-11-11 NOTE — Progress Notes (Signed)
? ?Assessment: ?1. Gross hematuria   ? ? ?Plan: ?Results of cystoscopy discussed with the patient.  No explanation for her gross hematuria on cystoscopy. ?Urine cytology sent today. ?Prescription for doxycycline twice daily x5 days sent to her pharmacy following cystoscopy. ?We will hold off on upper tract imaging at this time as she had CT with contrast performed in November 2022 and the PET scan in January 2023. ?Return to office in 6 weeks. ? ? ?Chief Complaint: ?Chief Complaint  ?Patient presents with  ? Hematuria  ? ? ?HPI: ?Taylor Weber is a 76 y.o. female who presents for continued evaluation of gross hematuria. ?She has noticed red urine on and off since Jan 2023. No clots. She underwent a PET CT scan January 2023 in the evaluation of possible lymphoma which was benign.  She had a 3 mm right lower pole stone and bilateral renal cysts.  U/A from 11/08/21 showed 30 red blood cells per high-powered. Her hgb was 13.16 Sep 2021 and Cr 0.15 Apr 2021.  ?Urine culture from 11/08/2021 grew <10K colonies. ?CT chest abdomen and pelvis with contrast from 11/22 demonstrated no obvious renal calculi, renal mass, or obstruction.  ?  ?Smoked for about 25 yrs but quit by age 71. No chemo. No work exposure. No GU surgery. TAHx around age 33.  ?  ?She voids with a good stream. Takes 40 mg IV lasix daily. She has frequency, urgency and UUI. Uses 1 ppd and typically dry.  ?  ?She is on xarelto for afib. She has MVP and TR with plan for mitral valve replacement, tricuspid valve repair, ligation of left atrial appendage on November 23, 2021 with Dr. Cheree [facility]itto at [facility]. She will be admitted to the hospital on November 22, 2021. Preop testing will be done at that time. ? ?She presents today for further evaluation with cystoscopy. ?She has not had any gross hematuria since her visit on 11/08/2021.  No flank pain or dysuria.  She does have urgency and occasional urge incontinence. ? ?Portions of the above documentation were copied from a prior visit  for review purposes only. ? ?Allergies: ?Allergies  ?Allergen Reactions  ? Amoxicillin-Pot Clavulanate Shortness Of Breath and Other (See Comments)  ?  AUGMENTIN  ?Other Reaction: GI Upset  ? Lidocaine Shortness Of Breath and Palpitations  ? Other Swelling  ?  Throat swelling  ? Cefuroxime Axetil Rash  ? Nitrofuran Derivatives Rash and Other (See Comments)  ?  (generic for Macrobid) Rash  ? Pentazocine Rash and Palpitations  ? Sulfa Antibiotics Rash  ? Methotrexate Derivatives   ?  Sob  ? Promethazine   ? Amiodarone Other (See Comments)  ?  Alopecia  ? Clodronic Acid Rash  ? Darvon Rash  ? Epinephrine Palpitations and Other (See Comments)  ?  Pt is unsure of this reaction ?Pt is unsure of this reaction ?Pt is unsure of this reaction  ? Levofloxacin Rash  ? Pentazocine Lactate Rash  ? Procaine Palpitations  ? Procaine Hcl Rash  ? Propoxyphene Palpitations  ? Septra [Sulfamethoxazole W/Trimethoprim (Co-Trimoxazole)] Rash  ? ? ?PMH: ?Past Medical History:  ?Diagnosis Date  ? Atherosclerotic cerebrovascular disease   ? nonobstructive  ? Breast cancer ([place])   ? remote right breast mastectomy,bilaterial implants  ? Breast cancer ([place])   ? radical mastectomy status post billaterial breast implants and right breast implat rupture  ? Carotid arterial disease ([place])   ? Carotid bruit   ? right   ? Chest pain   ?  normal coronary angiogram in december 2008  ? Chronic diastolic congestive heart failure ([place])   ? Diabetes mellitus   ? diet controlled  ? Dyspnea   ? chronic exertional dyspnea related to intermittant atrial  ? Mild aortic regurgitation with left ventricular dilation by prior echocardiogram   ? Mitral stenosis   ? moderate  ? Persistent atrial fibrillation ([place])   ? post cardioversion maintain normal sinus rhythm  ? Pneumonia   ? Pulmonary hypertension due to mitral valve disease ([place])   ? Moderate  ? Rheumatic fever   ? as a child  ? Rheumatic mitral stenosis   ? Silicone leakage from breast implant   ? Tricuspid  regurgitation   ? Vertigo   ? ? ?PSH: ?Past Surgical History:  ?Procedure Laterality Date  ? APPENDECTOMY    ? CARDIAC CATHETERIZATION  2008  ? no significant CAD  ? CARDIAC CATHETERIZATION  04/2011  ? No significant CAD, moderate mitral stenosis (mean gradient: 12 mm Hg, MVA: 1.83), moderate pulmonary hypertension (PVR: 2.4 Woods units), Normal LVEDP.   ? CARDIAC CATHETERIZATION N/A 04/15/2015  ? Procedure: Right/Left Heart Cath and Coronary Angiography;  Surgeon: Wellington Hampshire, MD;  Location: [place] [facility];  Service: Cardiovascular;  Laterality: N/A;  ? CATARACT EXTRACTION Right   ? CHOLECYSTECTOMY    ? ELECTROPHYSIOLOGIC STUDY N/A 03/02/2015  ? Procedure: Cardioversion;  Surgeon: Wellington Hampshire, MD;  Location: [facility];  Service: Cardiovascular;  Laterality: N/A;  ? MASTECTOMY Right   ? PLACEMENT OF BREAST IMPLANTS    ? s/p mastectomy  ? RIGHT/LEFT HEART CATH AND CORONARY ANGIOGRAPHY N/A 12/16/2020  ? Procedure: RIGHT/LEFT HEART CATH AND CORONARY ANGIOGRAPHY;  Surgeon: Wellington Hampshire, MD;  Location: [place] [facility];  Service: Cardiovascular;  Laterality: N/A;  ? TEE WITHOUT CARDIOVERSION N/A 04/15/2015  ? Procedure: TRANSESOPHAGEAL ECHOCARDIOGRAM (TEE);  Surgeon: Dorothy Spark, MD;  Location: [place];  Service: Cardiovascular;  Laterality: N/A;  ? TOTAL ABDOMINAL HYSTERECTOMY    ? ? ?SH: ?Social History  ? ?Tobacco Use  ? Smoking status: Former  ?  Packs/day: 0.30  ?  Years: 10.00  ?  Pack years: 3.00  ?  Types: Cigarettes  ?  Quit date: 08/09/1983  ?  Years since quitting: 38.2  ? Smokeless tobacco: Never  ?Vaping Use  ? Vaping Use: Never used  ?Substance Use Topics  ? Alcohol use: No  ? Drug use: No  ? ? ?ROS: ?Constitutional:  Negative for fever, chills, weight loss ?CV: Negative for chest pain, previous MI, hypertension ?Respiratory:  Negative for shortness of breath, wheezing, sleep apnea, frequent cough ?GI:  Negative for nausea, vomiting, bloody stool, GERD ? ?PE: ?BP 101/67   Pulse 85   ?GENERAL APPEARANCE:  Well appearing, well developed, well nourished, NAD ?HEENT:  Atraumatic, normocephalic, oropharynx clear ?NECK:  Supple without lymphadenopathy or thyromegaly ?ABDOMEN:  Soft, non-tender, no masses ?EXTREMITIES:  Moves all extremities well, without clubbing, cyanosis, or edema ?NEUROLOGIC:  Alert and oriented x 3, normal gait, CN II-XII grossly intact ?MENTAL STATUS:  appropriate ?BACK:  Non-tender to palpation, No CVAT ?SKIN:  Warm, dry, and intact ? ? ?Results: ?U/A: 0-5 WBC, >30 RBC, calcium oxalate crystals ? ?Procedure:  Flexible Cystourethroscopy ? ?Pre-operative Diagnosis: Gross hematuria ? ?Post-operative Diagnosis: Gross hematuria ? ?Anesthesia:  local with lidocaine jelly ? ?Surgical Narrative: ? ?After appropriate informed consent was obtained, the patient was prepped and draped in the usual sterile fashion in the supine position.  The patient was correctly identified and the proper procedure delineated prior to proceeding.  Sterile lidocaine gel was instilled in the urethra. ?The flexible cystoscope was introduced without difficulty. ? ?Findings: ? ?Urethra: Normal ? ?Bladder:  no mucosal lesions seen ? ?Ureteral orifices: normal ? ?Additional findings: none ? ?Saline bladder wash for cytology was performed.   ? ?The cystoscope was then removed.  The patient tolerated the procedure well. ? ? ? ?

## 2021-11-11 NOTE — Progress Notes (Signed)
Urine sent for Dian[place]  ?Ref #[id number] ?

## 2021-11-15 NOTE — Telephone Encounter (Signed)
Please see note below. 

## 2021-11-18 DIAGNOSIS — J90 Pleural effusion, not elsewhere classified: Secondary | ICD-10-CM | POA: Diagnosis not present

## 2021-11-18 DIAGNOSIS — Z9882 Breast implant status: Secondary | ICD-10-CM | POA: Diagnosis not present

## 2021-11-18 DIAGNOSIS — Z853 Personal history of malignant neoplasm of breast: Secondary | ICD-10-CM | POA: Diagnosis not present

## 2021-11-18 DIAGNOSIS — Z87828 Personal history of other (healed) physical injury and trauma: Secondary | ICD-10-CM | POA: Diagnosis not present

## 2021-11-18 DIAGNOSIS — R591 Generalized enlarged lymph nodes: Secondary | ICD-10-CM | POA: Diagnosis not present

## 2021-11-18 DIAGNOSIS — N200 Calculus of kidney: Secondary | ICD-10-CM | POA: Diagnosis not present

## 2021-11-22 DIAGNOSIS — R042 Hemoptysis: Secondary | ICD-10-CM | POA: Diagnosis not present

## 2021-11-22 DIAGNOSIS — I050 Rheumatic mitral stenosis: Secondary | ICD-10-CM | POA: Diagnosis not present

## 2021-11-22 DIAGNOSIS — G9341 Metabolic encephalopathy: Secondary | ICD-10-CM | POA: Diagnosis not present

## 2021-11-22 DIAGNOSIS — J189 Pneumonia, unspecified organism: Secondary | ICD-10-CM | POA: Diagnosis not present

## 2021-11-22 DIAGNOSIS — G9389 Other specified disorders of brain: Secondary | ICD-10-CM | POA: Diagnosis not present

## 2021-11-22 DIAGNOSIS — R918 Other nonspecific abnormal finding of lung field: Secondary | ICD-10-CM | POA: Diagnosis not present

## 2021-11-22 DIAGNOSIS — J849 Interstitial pulmonary disease, unspecified: Secondary | ICD-10-CM | POA: Diagnosis not present

## 2021-11-22 DIAGNOSIS — J948 Other specified pleural conditions: Secondary | ICD-10-CM | POA: Diagnosis not present

## 2021-11-22 DIAGNOSIS — J9602 Acute respiratory failure with hypercapnia: Secondary | ICD-10-CM | POA: Diagnosis not present

## 2021-11-22 DIAGNOSIS — Z20822 Contact with and (suspected) exposure to covid-19: Secondary | ICD-10-CM | POA: Diagnosis not present

## 2021-11-22 DIAGNOSIS — I319 Disease of pericardium, unspecified: Secondary | ICD-10-CM | POA: Diagnosis not present

## 2021-11-22 DIAGNOSIS — J9811 Atelectasis: Secondary | ICD-10-CM | POA: Diagnosis not present

## 2021-11-22 DIAGNOSIS — Z66 Do not resuscitate: Secondary | ICD-10-CM | POA: Diagnosis not present

## 2021-11-22 DIAGNOSIS — I071 Rheumatic tricuspid insufficiency: Secondary | ICD-10-CM | POA: Diagnosis not present

## 2021-11-22 DIAGNOSIS — J80 Acute respiratory distress syndrome: Secondary | ICD-10-CM | POA: Diagnosis not present

## 2021-11-22 DIAGNOSIS — I272 Pulmonary hypertension, unspecified: Secondary | ICD-10-CM | POA: Diagnosis not present

## 2021-11-22 DIAGNOSIS — G9340 Encephalopathy, unspecified: Secondary | ICD-10-CM | POA: Diagnosis not present

## 2021-11-22 DIAGNOSIS — I517 Cardiomegaly: Secondary | ICD-10-CM | POA: Diagnosis not present

## 2021-11-22 DIAGNOSIS — J9809 Other diseases of bronchus, not elsewhere classified: Secondary | ICD-10-CM | POA: Diagnosis not present

## 2021-11-22 DIAGNOSIS — R4182 Altered mental status, unspecified: Secondary | ICD-10-CM | POA: Diagnosis not present

## 2021-11-22 DIAGNOSIS — D65 Disseminated intravascular coagulation [defibrination syndrome]: Secondary | ICD-10-CM | POA: Diagnosis not present

## 2021-11-22 DIAGNOSIS — E1122 Type 2 diabetes mellitus with diabetic chronic kidney disease: Secondary | ICD-10-CM | POA: Diagnosis not present

## 2021-11-22 DIAGNOSIS — I4891 Unspecified atrial fibrillation: Secondary | ICD-10-CM | POA: Diagnosis not present

## 2021-11-22 DIAGNOSIS — Z792 Long term (current) use of antibiotics: Secondary | ICD-10-CM | POA: Diagnosis not present

## 2021-11-22 DIAGNOSIS — Z952 Presence of prosthetic heart valve: Secondary | ICD-10-CM | POA: Diagnosis not present

## 2021-11-22 DIAGNOSIS — Z4659 Encounter for fitting and adjustment of other gastrointestinal appliance and device: Secondary | ICD-10-CM | POA: Diagnosis not present

## 2021-11-22 DIAGNOSIS — I052 Rheumatic mitral stenosis with insufficiency: Secondary | ICD-10-CM | POA: Diagnosis not present

## 2021-11-22 DIAGNOSIS — Z954 Presence of other heart-valve replacement: Secondary | ICD-10-CM | POA: Diagnosis not present

## 2021-11-22 DIAGNOSIS — D62 Acute posthemorrhagic anemia: Secondary | ICD-10-CM | POA: Diagnosis not present

## 2021-11-22 DIAGNOSIS — R570 Cardiogenic shock: Secondary | ICD-10-CM | POA: Diagnosis not present

## 2021-11-22 DIAGNOSIS — E7220 Disorder of urea cycle metabolism, unspecified: Secondary | ICD-10-CM | POA: Diagnosis not present

## 2021-11-22 DIAGNOSIS — I3139 Other pericardial effusion (noninflammatory): Secondary | ICD-10-CM | POA: Diagnosis not present

## 2021-11-22 DIAGNOSIS — J9601 Acute respiratory failure with hypoxia: Secondary | ICD-10-CM | POA: Diagnosis not present

## 2021-11-22 DIAGNOSIS — E1165 Type 2 diabetes mellitus with hyperglycemia: Secondary | ICD-10-CM | POA: Diagnosis not present

## 2021-11-22 DIAGNOSIS — J811 Chronic pulmonary edema: Secondary | ICD-10-CM | POA: Diagnosis not present

## 2021-11-22 DIAGNOSIS — I482 Chronic atrial fibrillation, unspecified: Secondary | ICD-10-CM | POA: Diagnosis not present

## 2021-11-22 DIAGNOSIS — I081 Rheumatic disorders of both mitral and tricuspid valves: Secondary | ICD-10-CM | POA: Diagnosis not present

## 2021-11-22 DIAGNOSIS — R410 Disorientation, unspecified: Secondary | ICD-10-CM | POA: Diagnosis not present

## 2021-11-22 DIAGNOSIS — E874 Mixed disorder of acid-base balance: Secondary | ICD-10-CM | POA: Diagnosis not present

## 2021-11-22 DIAGNOSIS — Z452 Encounter for adjustment and management of vascular access device: Secondary | ICD-10-CM | POA: Diagnosis not present

## 2021-11-22 DIAGNOSIS — R6521 Severe sepsis with septic shock: Secondary | ICD-10-CM | POA: Diagnosis not present

## 2021-11-22 DIAGNOSIS — I5043 Acute on chronic combined systolic (congestive) and diastolic (congestive) heart failure: Secondary | ICD-10-CM | POA: Diagnosis not present

## 2021-11-22 DIAGNOSIS — I3481 Nonrheumatic mitral (valve) annulus calcification: Secondary | ICD-10-CM | POA: Diagnosis not present

## 2021-11-22 DIAGNOSIS — Z9911 Dependence on respirator [ventilator] status: Secondary | ICD-10-CM | POA: Diagnosis not present

## 2021-11-22 DIAGNOSIS — N183 Chronic kidney disease, stage 3 unspecified: Secondary | ICD-10-CM | POA: Diagnosis not present

## 2021-11-22 DIAGNOSIS — I959 Hypotension, unspecified: Secondary | ICD-10-CM | POA: Diagnosis not present

## 2021-11-22 DIAGNOSIS — I340 Nonrheumatic mitral (valve) insufficiency: Secondary | ICD-10-CM | POA: Diagnosis not present

## 2021-11-22 DIAGNOSIS — I4892 Unspecified atrial flutter: Secondary | ICD-10-CM | POA: Diagnosis not present

## 2021-11-22 DIAGNOSIS — T8111XA Postprocedural  cardiogenic shock, initial encounter: Secondary | ICD-10-CM | POA: Diagnosis not present

## 2021-11-22 DIAGNOSIS — R17 Unspecified jaundice: Secondary | ICD-10-CM | POA: Diagnosis not present

## 2021-11-22 DIAGNOSIS — N179 Acute kidney failure, unspecified: Secondary | ICD-10-CM | POA: Diagnosis not present

## 2021-11-22 DIAGNOSIS — I6381 Other cerebral infarction due to occlusion or stenosis of small artery: Secondary | ICD-10-CM | POA: Diagnosis not present

## 2021-11-22 DIAGNOSIS — E870 Hyperosmolality and hypernatremia: Secondary | ICD-10-CM | POA: Diagnosis not present

## 2021-11-22 DIAGNOSIS — Z992 Dependence on renal dialysis: Secondary | ICD-10-CM | POA: Diagnosis not present

## 2021-11-22 DIAGNOSIS — I4819 Other persistent atrial fibrillation: Secondary | ICD-10-CM | POA: Diagnosis not present

## 2021-11-22 DIAGNOSIS — I471 Supraventricular tachycardia: Secondary | ICD-10-CM | POA: Diagnosis not present

## 2021-11-22 DIAGNOSIS — E861 Hypovolemia: Secondary | ICD-10-CM | POA: Diagnosis not present

## 2021-11-22 DIAGNOSIS — G8918 Other acute postprocedural pain: Secondary | ICD-10-CM | POA: Diagnosis not present

## 2021-11-22 DIAGNOSIS — I639 Cerebral infarction, unspecified: Secondary | ICD-10-CM | POA: Diagnosis not present

## 2021-11-22 DIAGNOSIS — Z515 Encounter for palliative care: Secondary | ICD-10-CM | POA: Diagnosis not present

## 2021-11-22 DIAGNOSIS — M069 Rheumatoid arthritis, unspecified: Secondary | ICD-10-CM | POA: Diagnosis not present

## 2021-11-22 DIAGNOSIS — J982 Interstitial emphysema: Secondary | ICD-10-CM | POA: Diagnosis not present

## 2021-11-22 DIAGNOSIS — R5381 Other malaise: Secondary | ICD-10-CM | POA: Diagnosis not present

## 2021-11-22 DIAGNOSIS — Z953 Presence of xenogenic heart valve: Secondary | ICD-10-CM | POA: Diagnosis not present

## 2021-11-22 DIAGNOSIS — R2990 Unspecified symptoms and signs involving the nervous system: Secondary | ICD-10-CM | POA: Diagnosis not present

## 2021-11-22 DIAGNOSIS — Z7901 Long term (current) use of anticoagulants: Secondary | ICD-10-CM | POA: Diagnosis not present

## 2021-11-22 DIAGNOSIS — J90 Pleural effusion, not elsewhere classified: Secondary | ICD-10-CM | POA: Diagnosis not present

## 2021-11-22 DIAGNOSIS — Z4682 Encounter for fitting and adjustment of non-vascular catheter: Secondary | ICD-10-CM | POA: Diagnosis not present

## 2021-11-22 DIAGNOSIS — R579 Shock, unspecified: Secondary | ICD-10-CM | POA: Diagnosis not present

## 2021-11-22 DIAGNOSIS — J939 Pneumothorax, unspecified: Secondary | ICD-10-CM | POA: Diagnosis not present

## 2021-11-22 DIAGNOSIS — R569 Unspecified convulsions: Secondary | ICD-10-CM | POA: Diagnosis not present

## 2021-11-22 DIAGNOSIS — Z9889 Other specified postprocedural states: Secondary | ICD-10-CM | POA: Diagnosis not present

## 2021-11-22 DIAGNOSIS — I480 Paroxysmal atrial fibrillation: Secondary | ICD-10-CM | POA: Diagnosis not present

## 2021-11-22 DIAGNOSIS — A419 Sepsis, unspecified organism: Secondary | ICD-10-CM | POA: Diagnosis not present

## 2021-12-06 ENCOUNTER — Other Ambulatory Visit: Payer: Self-pay | Admitting: Urology

## 2021-12-23 ENCOUNTER — Ambulatory Visit: Payer: Medicare Other | Admitting: Urology

## 2021-12-23 NOTE — Progress Notes (Deleted)
Assessment: 1. Gross hematuria; negative evaluation 4/23      Plan: Results of cystoscopy discussed with the patient.  No explanation for her gross hematuria on cystoscopy. We will hold off on upper tract imaging at this time as she had CT with contrast performed in November 2022 and the PET scan in January 2023. Return to office in 6 weeks.   Chief Complaint: No chief complaint on file.   HPI: Taylor Weber is a 76 y.o. female who presents for continued evaluation of gross hematuria. She has noticed red urine on and off since Jan 2023. No clots. She underwent a PET CT scan January 2023 in the evaluation of possible lymphoma which was benign.  She had a 3 mm right lower pole stone and bilateral renal cysts.  U/A from 11/08/21 showed 30 red blood cells per high-powered. Her hgb was 13.16 Sep 2021 and Cr 0.15 Apr 2021.  Urine culture from 11/08/2021 grew <10K colonies. CT chest abdomen and pelvis with contrast from 11/22 demonstrated no obvious renal calculi, renal mass, or obstruction.    Smoked for about 25 yrs but quit by age 31. No chemo. No work exposure. No GU surgery. TAHx around age 64.    She voids with a good stream. Takes 40 mg IV lasix daily. She has frequency, urgency and UUI. Uses 1 ppd and typically dry.    She is on xarelto for afib. She has MVP and TR with plan for mitral valve replacement, tricuspid valve repair, ligation of left atrial appendage on November 23, 2021 with Dr. Cheree Ditto at [facility]. She will be admitted to the hospital on November 22, 2021. Preop testing will be done at that time.  She reported no episodes of gross hematuria since her visit on 11/08/2021.  No flank pain or dysuria.  She reported urgency and occasional urge incontinence. Cystoscopy from 4/23 demonstrated no urethral or bladder abnormalities.  Urine cytology was negative for malignancy.  Portions of the above documentation were copied from a prior visit for review purposes only.  Allergies: Allergies   Allergen Reactions   Amoxicillin-Pot Clavulanate Shortness Of Breath and Other (See Comments)    AUGMENTIN  Other Reaction: GI Upset   Lidocaine Shortness Of Breath and Palpitations   Other Swelling    Throat swelling   Cefuroxime Axetil Rash   Nitrofuran Derivatives Rash and Other (See Comments)    (generic for Macrobid) Rash   Pentazocine Rash and Palpitations   Sulfa Antibiotics Rash   Methotrexate Derivatives     Sob   Promethazine    Amiodarone Other (See Comments)    Alopecia   Clodronic Acid Rash   Darvon Rash   Epinephrine Palpitations and Other (See Comments)    Pt is unsure of this reaction Pt is unsure of this reaction Pt is unsure of this reaction   Levofloxacin Rash   Pentazocine Lactate Rash   Procaine Palpitations   Procaine Hcl Rash   Propoxyphene Palpitations   Septra [Sulfamethoxazole W/Trimethoprim (Co-Trimoxazole)] Rash    PMH: Past Medical History:  Diagnosis Date   Atherosclerotic cerebrovascular disease    nonobstructive   Breast cancer ([place])    remote right breast mastectomy,bilaterial implants   Breast cancer ([place])    radical mastectomy status post billaterial breast implants and right breast implat rupture   Carotid arterial disease ([facility])    Carotid bruit    right    Chest pain    normal coronary angiogram in december 2008  Chronic diastolic congestive heart failure ([facility])    Diabetes mellitus    diet controlled   Dyspnea    chronic exertional dyspnea related to intermittant atrial   Mild aortic regurgitation with left ventricular dilation by prior echocardiogram    Mitral stenosis    moderate   Persistent atrial fibrillation ([facility])    post cardioversion maintain normal sinus rhythm   Pneumonia    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    CARDIAC CATHETERIZATION N/A 04/15/2015   Procedure: Right/Left Heart Cath and Coronary Angiography;  Surgeon: Wellington Hampshire, MD;  Location: [place] [facility];  Service: Cardiovascular;  Laterality: N/A;   CATARACT EXTRACTION Right    CHOLECYSTECTOMY     ELECTROPHYSIOLOGIC STUDY N/A 03/02/2015   Procedure: Cardioversion;  Surgeon: Wellington Hampshire, MD;  Location: [facility];  Service: Cardiovascular;  Laterality: N/A;   MASTECTOMY Right    PLACEMENT OF BREAST IMPLANTS     s/p mastectomy   RIGHT/LEFT HEART CATH AND CORONARY ANGIOGRAPHY N/A 12/16/2020   Procedure: RIGHT/LEFT HEART CATH AND CORONARY ANGIOGRAPHY;  Surgeon: Wellington Hampshire, MD;  Location: [place] [facility];  Service: Cardiovascular;  Laterality: N/A;   TEE WITHOUT CARDIOVERSION N/A 04/15/2015   Procedure: TRANSESOPHAGEAL ECHOCARDIOGRAM (TEE);  Surgeon: Dorothy Spark, MD;  Location: [place];  Service: Cardiovascular;  Laterality: N/A;   TOTAL ABDOMINAL HYSTERECTOMY      SH: Social History   Tobacco Use   Smoking status: Former    Packs/day: 0.30    Years: 10.00    Pack years: 3.00    Types: Cigarettes    Quit date: 08/09/1983    Years since quitting: 38.4   Smokeless tobacco: Never  Vaping Use   Vaping Use: Never used  Substance Use Topics   Alcohol use: No   Drug use: No    ROS: Constitutional:  Negative for fever, chills, weight loss CV: Negative for chest pain, previous MI, hypertension Respiratory:  Negative for shortness of breath, wheezing, sleep apnea, frequent cough GI:  Negative for nausea, vomiting, bloody stool, GERD  PE: There were no vitals taken for this visit. GENERAL APPEARANCE:  Well  appearing, well developed, well nourished, NAD HEENT:  Atraumatic, normocephalic, oropharynx clear NECK:  Supple without lymphadenopathy or thyromegaly ABDOMEN:  Soft, non-tender, no masses EXTREMITIES:  Moves all extremities well, without clubbing, cyanosis, or edema NEUROLOGIC:  Alert and oriented x 3, normal gait, CN II-XII grossly intact MENTAL STATUS:  appropriate BACK:  Non-tender to palpation, No CVAT SKIN:  Warm, dry, and intact   Results: U/A:

## 2022-01-06 DEATH — deceased

## 2022-01-28 ENCOUNTER — Ambulatory Visit: Payer: 59 | Admitting: Cardiovascular Disease
